# Patient Record
Sex: Female | Born: 1935
Health system: Southern US, Community
[De-identification: ages and names within clinical notes are randomized; demographics above are authoritative.]

## PROBLEM LIST (undated history)

## (undated) DIAGNOSIS — E785 Hyperlipidemia, unspecified: Secondary | ICD-10-CM

## (undated) DIAGNOSIS — F329 Major depressive disorder, single episode, unspecified: Secondary | ICD-10-CM

## (undated) DIAGNOSIS — M1990 Unspecified osteoarthritis, unspecified site: Secondary | ICD-10-CM

## (undated) DIAGNOSIS — F32A Depression, unspecified: Secondary | ICD-10-CM

## (undated) DIAGNOSIS — F0390 Unspecified dementia without behavioral disturbance: Secondary | ICD-10-CM

## (undated) DIAGNOSIS — I10 Essential (primary) hypertension: Secondary | ICD-10-CM

## (undated) HISTORY — DX: Hyperlipidemia, unspecified: E78.5

## (undated) HISTORY — PX: ABDOMINAL HYSTERECTOMY: SHX81

## (undated) HISTORY — DX: Essential (primary) hypertension: I10

## (undated) HISTORY — DX: Major depressive disorder, single episode, unspecified: F32.9

## (undated) HISTORY — DX: Depression, unspecified: F32.A

---

## 1989-03-02 HISTORY — PX: JOINT REPLACEMENT: SHX530

## 2004-05-20 ENCOUNTER — Ambulatory Visit: Payer: Self-pay | Admitting: Internal Medicine

## 2005-05-26 ENCOUNTER — Ambulatory Visit: Payer: Self-pay | Admitting: Internal Medicine

## 2006-05-28 ENCOUNTER — Ambulatory Visit: Payer: Self-pay | Admitting: Internal Medicine

## 2007-06-29 ENCOUNTER — Ambulatory Visit: Payer: Self-pay | Admitting: Internal Medicine

## 2008-07-04 ENCOUNTER — Ambulatory Visit: Payer: Self-pay | Admitting: Internal Medicine

## 2009-07-16 ENCOUNTER — Ambulatory Visit: Payer: Self-pay | Admitting: Internal Medicine

## 2012-05-03 ENCOUNTER — Ambulatory Visit: Payer: Self-pay | Admitting: Ophthalmology

## 2012-05-03 LAB — POTASSIUM: Potassium: 3.6 mmol/L (ref 3.5–5.1)

## 2012-05-16 ENCOUNTER — Ambulatory Visit: Payer: Self-pay | Admitting: Ophthalmology

## 2013-02-08 ENCOUNTER — Ambulatory Visit: Payer: Self-pay | Admitting: Family Medicine

## 2013-04-11 LAB — BASIC METABOLIC PANEL
BUN: 17 mg/dL (ref 4–21)
Creatinine: 1 mg/dL (ref <=1.1)
GLUCOSE: 92 mg/dL
Potassium: 4 mmol/L (ref 3.4–5.3)
Sodium: 138 mmol/L (ref 137–147)

## 2013-04-11 LAB — LIPID PANEL
CHOLESTEROL: 246 mg/dL — AB (ref 0–200)
HDL: 70 mg/dL (ref 35–70)
LDL Cholesterol: 162 mg/dL
LDl/HDL Ratio: 3.5
Triglycerides: 68 mg/dL (ref 40–160)

## 2013-11-02 ENCOUNTER — Ambulatory Visit (INDEPENDENT_AMBULATORY_CARE_PROVIDER_SITE_OTHER): Payer: Commercial Managed Care - HMO | Admitting: Internal Medicine

## 2013-11-02 ENCOUNTER — Encounter: Payer: Self-pay | Admitting: Internal Medicine

## 2013-11-02 ENCOUNTER — Other Ambulatory Visit: Payer: Self-pay | Admitting: *Deleted

## 2013-11-02 VITALS — BP 140/82 | HR 70 | Temp 98.2°F | Resp 20 | Ht 66.93 in | Wt 156.2 lb

## 2013-11-02 DIAGNOSIS — R059 Cough, unspecified: Secondary | ICD-10-CM

## 2013-11-02 DIAGNOSIS — F329 Major depressive disorder, single episode, unspecified: Secondary | ICD-10-CM

## 2013-11-02 DIAGNOSIS — E785 Hyperlipidemia, unspecified: Secondary | ICD-10-CM

## 2013-11-02 DIAGNOSIS — R05 Cough: Secondary | ICD-10-CM

## 2013-11-02 DIAGNOSIS — N3941 Urge incontinence: Secondary | ICD-10-CM

## 2013-11-02 DIAGNOSIS — N390 Urinary tract infection, site not specified: Secondary | ICD-10-CM | POA: Insufficient documentation

## 2013-11-02 DIAGNOSIS — F4321 Adjustment disorder with depressed mood: Secondary | ICD-10-CM

## 2013-11-02 DIAGNOSIS — R413 Other amnesia: Secondary | ICD-10-CM | POA: Insufficient documentation

## 2013-11-02 DIAGNOSIS — R053 Chronic cough: Secondary | ICD-10-CM

## 2013-11-02 DIAGNOSIS — Z23 Encounter for immunization: Secondary | ICD-10-CM

## 2013-11-02 DIAGNOSIS — I10 Essential (primary) hypertension: Secondary | ICD-10-CM

## 2013-11-02 MED ORDER — MIRABEGRON ER 25 MG PO TB24: 25.0000 mg | ORAL_TABLET | Freq: Every day | ORAL | Status: DC

## 2013-11-02 NOTE — Addendum Note (Signed)
Addended by: Jearld Adjutant on: 11/02/2013 11:27 AM   Modules accepted: Orders

## 2013-11-02 NOTE — Patient Instructions (Signed)
Stop oxybutynin Start myrbetriq 25mg  samples (two weeks provided)--if you tolerate this, fill the prescription (was faxed to pharmacy)

## 2013-11-02 NOTE — Progress Notes (Signed)
Patient ID: Katherine Olson, female   DOB: September 03, 1926, 80 y.o.   MRN: [id number]   Location:  [facility]  Code Status: Has living will and HCPOA (her son) and she does not want resuscitation  Allergies not on file  Chief Complaint  Patient presents with  . Establish Care    HPI: Patient is a 80 y.o. black female seen in the office today to establish with the practice.  She was referred by our previous medical assistant.  She has a h/o htn, hyperlipidemia, partial hysterectomy and total hip replacement  Previously went to [facility] in [place].  Her son feels she should see a female physician.  Pt says she is fine.  Sleeps well at night.  Has a good appetite.  Works in her flowers.  Does housework.  Denies aches and pains.  Did have them when she was on cholesterol medication.  Feels better since off of them.  No headaches.  No indigestion.    Has tried pravastatin and zocor with cramps at night and joint stiffness.  Is now on fish oil, niacin and coq10.  Has fasted to see what cholesterol is.  Has lost a few lbs.  Is a fan of food in general--sweets, starches, just not pork.    Her son lives with her.  He took care of his father until he passed and now is looking after her.    Kidneys were elevated a little last time per son.  Calcium had been elevated also.    Has a dry cough.  Gets strangled.  Feels her throat is dry.    Had lost several sisters around the same time and her husband so was started on celexa.  Mood is ok.  Takes daily.  Does not use the ativans but 1-2x per month when alone and anxious.    Takes ditropan.  Wears pads for protection.  Usually can make it to the bathroom.  Does not leak.  Knows when she has to go.  Sometimes does not COMPLETELY make it.    Pt says her memory is good.  Her son disagrees.  Forgets sometimes. She does not get lost.     Review of Systems:  Review of Systems  Constitutional: Negative for  fever, chills, weight loss and malaise/fatigue.  HENT: Negative for congestion, hearing loss and sore throat.   Respiratory: Positive for cough. Negative for shortness of breath.   Cardiovascular: Negative for chest pain.  Skin: Negative for rash.  Neurological: Negative for weakness and headaches.     Past Medical History  Diagnosis Date  . Hyperlipidemia     Past Surgical History  Procedure Laterality Date  . Abdominal hysterectomy    . Joint replacement Right 1991    hip    Social History:   reports that she has never smoked. She does not have any smokeless tobacco history on file. She reports that she does not drink alcohol or use illicit drugs.  No family history on file.  Medications: Patient's Medications  New Prescriptions   No medications on file  Previous Medications   ASPIRIN 81 MG TABLET    Take 81 mg by mouth daily.   CALCIUM CARBONATE (OS-CAL) 600 MG TABS TABLET    Take 600 mg by mouth 2 (two) times daily with a meal.   CITALOPRAM (CELEXA) 20 MG TABLET    Take 20 mg by mouth daily.   COENZYME Q10 (CO Q-10) 200 MG  CAPS    Take 1 tablet by mouth daily.    LORAZEPAM (ATIVAN) 0.5 MG TABLET    Take 0.5 mg by mouth every 8 (eight) hours.   LOSARTAN-HYDROCHLOROTHIAZIDE (HYZAAR) 50-12.5 MG PER TABLET    Take 1 tablet by mouth daily.   MULTIPLE VITAMINS-MINERALS (MULTIVITAMIN WITH MINERALS) TABLET    Take 1 tablet by mouth daily.   NIACIN (NIASPAN) 500 MG CR TABLET    Take 1,000 mg by mouth at bedtime.    OMEGA 3 1200 MG CAPS    Take 2,400 mg by mouth 2 (two) times daily.    OXYBUTYNIN (DITROPAN) 5 MG TABLET    Take 5 mg by mouth 2 (two) times daily.   Modified Medications   No medications on file  Discontinued Medications   No medications on file     Physical Exam: Filed Vitals:   11/02/13 0906  BP: 140/82  Pulse: 70  Temp: 98.2 F (36.8 C)  TempSrc: Oral  Resp: 20  Height: 5' 6.93" (1.7 m)  Weight: 156 lb 3.2 oz (70.852 kg)  SpO2: 96%   Physical  Exam  Constitutional: She is oriented to person, place, and time. She appears well-developed and well-nourished. No distress.  HENT:  Head: Normocephalic and atraumatic.  Eyes:  glasses  Cardiovascular: Normal rate, regular rhythm, normal heart sounds and intact distal pulses.   Pulmonary/Chest: Effort normal and breath sounds normal. No respiratory distress.  Abdominal: Soft. Bowel sounds are normal. She exhibits no distension. There is no tenderness.  Musculoskeletal: Normal range of motion.  Neurological: She is alert and oriented to person, place, and time.  Skin: Skin is warm and dry.  Psychiatric: She has a normal mood and affect.    Labs reviewed: Reviewed in records   Assessment/Plan 1. Urge incontinence -d/c oxybutynin due to potential effects on memory, dry mouth, constipation, fall risk - mirabegron ER (MYRBETRIQ) 25 MG TB24 tablet; Take 1 tablet (25 mg total) by mouth daily. For urge incontinence  Dispense: 30 tablet; Refill: 3 - CBC With differential/Platelet - Comprehensive metabolic panel - Lipid panel  2. Memory loss - will evaluate labs that can affect memory: - B12 and Folate Panel - TSH -does not use ativan enough to affect it  3. Hyperlipidemia -does not tolerate zocor or pravachol -on niacin, fish oil and co q 10 -check flp today and will notify her son what to do next  4. Essential hypertension -bp at goal with current meds and age 80 yo  5. Need for vaccination with 13-polyvalent pneumococcal conjugate vaccine -we don't have any of this today so will need to get next time  6. Reactive depression (situational) -is very well controlled with celexa -would not change this b/c she is not requiring much ativan at this point (uses when anxious if lonely)  7.  Persistent dry cough -only at night -suspect due to losartan/hctz -she does not feel bothered by this and chooses to continue it at this time--to let me know if she changes her  mind  Labs/tests ordered: Orders Placed This Encounter  Procedures  . B12 and Folate Panel  . CBC With differential/Platelet  . Comprehensive metabolic panel  . Lipid panel  . TSH  urine culture  REFUSED INFLUENZA VACCINE  Next appt: 6 wks for annual exam with mmse, f/u on bladder and for prevnar vaccine

## 2013-11-03 ENCOUNTER — Telehealth: Payer: Self-pay | Admitting: *Deleted

## 2013-11-03 LAB — COMPREHENSIVE METABOLIC PANEL
ALT: 6 IU/L (ref 0–32)
AST: 13 IU/L (ref 0–40)
Albumin/Globulin Ratio: 1.6 (ref 1.1–2.5)
Albumin: 4.2 g/dL (ref 3.5–4.7)
Alkaline Phosphatase: 59 IU/L (ref 39–117)
BUN/Creatinine Ratio: 19 (ref 11–26)
BUN: 20 mg/dL (ref 8–27)
CO2: 25 mmol/L (ref 18–29)
Calcium: 10.2 mg/dL (ref 8.7–10.3)
Chloride: 102 mmol/L (ref 97–108)
Creatinine, Ser: 1.05 mg/dL — ABNORMAL HIGH (ref 0.57–1.00)
GFR calc Af Amer: 56 mL/min/{1.73_m2} — ABNORMAL LOW (ref >59)
GFR calc non Af Amer: 49 mL/min/{1.73_m2} — ABNORMAL LOW (ref >59)
Globulin, Total: 2.6 g/dL (ref 1.5–4.5)
Glucose: 94 mg/dL (ref 65–99)
Potassium: 4 mmol/L (ref 3.5–5.2)
Sodium: 142 mmol/L (ref 134–144)
Total Bilirubin: 0.4 mg/dL (ref 0.0–1.2)
Total Protein: 6.8 g/dL (ref 6.0–8.5)

## 2013-11-03 LAB — LIPID PANEL
Chol/HDL Ratio: 2.8 ratio units (ref 0.0–4.4)
Cholesterol, Total: 227 mg/dL — ABNORMAL HIGH (ref 100–199)
HDL: 82 mg/dL (ref >39)
LDL Calculated: 134 mg/dL — ABNORMAL HIGH (ref 0–99)
Triglycerides: 55 mg/dL (ref 0–149)
VLDL Cholesterol Cal: 11 mg/dL (ref 5–40)

## 2013-11-03 LAB — CBC WITH DIFFERENTIAL
Basophils Absolute: 0.1 10*3/uL (ref 0.0–0.2)
Basos: 1 %
Eos: 1 %
Eosinophils Absolute: 0.1 10*3/uL (ref 0.0–0.4)
HCT: 37.7 % (ref 34.0–46.6)
Hemoglobin: 13 g/dL (ref 11.1–15.9)
Immature Grans (Abs): 0 10*3/uL (ref 0.0–0.1)
Immature Granulocytes: 0 %
Lymphocytes Absolute: 2 10*3/uL (ref 0.7–3.1)
Lymphs: 26 %
MCH: 30.7 pg (ref 26.6–33.0)
MCHC: 34.5 g/dL (ref 31.5–35.7)
MCV: 89 fL (ref 79–97)
Monocytes Absolute: 0.6 10*3/uL (ref 0.1–0.9)
Monocytes: 8 %
Neutrophils Absolute: 5 10*3/uL (ref 1.4–7.0)
Neutrophils Relative %: 64 %
Platelets: 227 10*3/uL (ref 150–379)
RBC: 4.23 x10E6/uL (ref 3.77–5.28)
RDW: 13.4 % (ref 12.3–15.4)
WBC: 7.8 10*3/uL (ref 3.4–10.8)

## 2013-11-03 LAB — URINE CULTURE: Organism ID, Bacteria: NO GROWTH

## 2013-11-03 LAB — B12 AND FOLATE PANEL
Folate: >19.9 ng/mL (ref >3.0)
Vitamin B-12: 478 pg/mL (ref 211–946)

## 2013-11-03 LAB — URINALYSIS
Bilirubin, UA: NEGATIVE
Glucose, UA: NEGATIVE
Ketones, UA: NEGATIVE
Nitrite, UA: NEGATIVE
Protein, UA: NEGATIVE
RBC, UA: NEGATIVE
Specific Gravity, UA: 1.018 (ref 1.005–1.030)
Urobilinogen, Ur: 1 mg/dL (ref 0.0–1.9)
pH, UA: 7 (ref 5.0–7.5)

## 2013-11-03 LAB — TSH: TSH: 2.75 u[IU]/mL (ref 0.450–4.500)

## 2013-11-03 NOTE — Telephone Encounter (Signed)
Message copied by Eilene Ghazi on Fri Nov 03, 2013  3:04 PM ------      Message from: [place], [place]      Created: Fri Nov 03, 2013  8:08 AM       Please call her son:  Cholesterol has improved from prior readings about 6 mos ago.  Her LDL is 134 with goal less than 100, but she is low risk--she does not have coronary artery disease or diabetes, no known prior strokes or heart attacks.  With very good HDL, I would encourage more walking for exercise and continue healthy low cholesterol diet, plus the niacin, fish oil and coq10 as discussed yesterday.  No changes. ------

## 2013-11-03 NOTE — Telephone Encounter (Signed)
Spoke with the patient's son regarding her lab results, informed him that her labs look good and she has improved from 6 mos ago. I informed him that she need to continue the healthy diet that she was doing and exercise by walking more.

## 2013-11-07 ENCOUNTER — Telehealth: Payer: Self-pay | Admitting: *Deleted

## 2013-11-07 NOTE — Telephone Encounter (Signed)
Disregard this message. Entered in Error. (Computer went down)

## 2013-11-07 NOTE — Telephone Encounter (Signed)
There are no safe alternatives.  Other meds for urinary incontinence cause confusion, falls, dry mouth and constipation.  I do not recommend them.  Hopefully, myrbetriq will eventually get to be cheaper.  They can try one of the others if he wants, but those are the risks-if they still want something, vesicare is the next best 5mg  po daily.

## 2013-11-07 NOTE — Telephone Encounter (Signed)
Patient son called and stated that his mom is having Hallucinations with the new "pee pill". But wasn't 100% sure that it is coming from that or something else. Please Advise.

## 2013-11-07 NOTE — Telephone Encounter (Signed)
Patient son called and stated that the Myrbetriq is too expensive and needs an alternative. Please Advise.

## 2013-11-08 NOTE — Telephone Encounter (Signed)
LMOM to return call.

## 2013-11-08 NOTE — Telephone Encounter (Signed)
Patient son stated that he will hold off of the medication for now. Will wait and see if the medication gets cheaper.

## 2013-11-20 ENCOUNTER — Ambulatory Visit: Payer: Self-pay | Admitting: Internal Medicine

## 2013-12-08 ENCOUNTER — Telehealth: Payer: Self-pay | Admitting: *Deleted

## 2013-12-08 NOTE — Telephone Encounter (Signed)
Katherine Olson, Son called and stated that patient has a appointment on 10/19. And he wants to make sure the Dr. Franco Nones that she is getting forgetful. Son does not want you to say anything to her about maybe having Dementia or Alzheimer's. Son doesn't want her upset and get more depressed. Son stated that the Dr. Lilian Coma tell him everything but not his mother. Son very [employer] attorney

## 2013-12-18 ENCOUNTER — Encounter: Payer: Self-pay | Admitting: Internal Medicine

## 2013-12-18 ENCOUNTER — Ambulatory Visit (INDEPENDENT_AMBULATORY_CARE_PROVIDER_SITE_OTHER): Payer: Commercial Managed Care - HMO | Admitting: Internal Medicine

## 2013-12-18 VITALS — BP 152/78 | HR 66 | Temp 97.4°F | Resp 20 | Ht 66.0 in | Wt 158.8 lb

## 2013-12-18 DIAGNOSIS — Z23 Encounter for immunization: Secondary | ICD-10-CM

## 2013-12-18 DIAGNOSIS — E785 Hyperlipidemia, unspecified: Secondary | ICD-10-CM

## 2013-12-18 DIAGNOSIS — I10 Essential (primary) hypertension: Secondary | ICD-10-CM

## 2013-12-18 DIAGNOSIS — G3184 Mild cognitive impairment, so stated: Secondary | ICD-10-CM

## 2013-12-18 DIAGNOSIS — N3941 Urge incontinence: Secondary | ICD-10-CM

## 2013-12-18 NOTE — Addendum Note (Signed)
Addended by: Eilene Ghazi on: 12/18/2013 02:23 PM   Modules accepted: Orders

## 2013-12-18 NOTE — Progress Notes (Signed)
Clock passed

## 2013-12-18 NOTE — Progress Notes (Signed)
Patient ID: Katherine Olson, female   DOB: 1926/09/21, 80 y.o.   MRN: [id number]   Location:  [facility]   Allergies  Allergen Reactions  . Zocor [Simvastatin] Other (See Comments)    Stiffness in joints    Chief Complaint  Patient presents with  . Medical Management of Chronic Issues    HPI: Patient is a 80 y.o. black female seen in the office today for 6 wk fu on med mgt of chronic diseases.  Got prevnar 13.  Previously had pneumococcal vaccine.  Scored 25/30 on MMSE today.  Passed clock drawing.  No new concerns from her son today.    Has had some increased urge incontinence--tried the myrbetriq, but pt did not want to spend that much on it.   Is taking fish oil and niacin.  Good cholesterol is very good.  Did not tolerate statin due to cramps.  Her son is encouraging water and healthy diet.  Discussed walking as well.  Discussed indoor walking at large stores.    Review of Systems:  Review of Systems  Constitutional: Negative for fever.  HENT: Negative for congestion.   Eyes: Negative for blurred vision.  Respiratory: Negative for shortness of breath.   Cardiovascular: Negative for chest pain.  Gastrointestinal: Negative for abdominal pain.  Genitourinary: Positive for urgency and frequency. Negative for dysuria.       Esp at night  Musculoskeletal: Negative for falls and myalgias.  Skin: Negative for rash.  Neurological: Negative for dizziness.  Endo/Heme/Allergies: Does not bruise/bleed easily.  Psychiatric/Behavioral: Positive for depression and memory loss.    Past Medical History  Diagnosis Date  . Hyperlipidemia   . Hypertension     Past Surgical History  Procedure Laterality Date  . Abdominal hysterectomy      partial  . Joint replacement Right 1991    hip    Social History:   reports that she has never smoked. She does not have any smokeless tobacco history on file. She reports that she does not drink alcohol or  use illicit drugs.  Family History  Problem Relation Age of Onset  . Alzheimer's disease Brother   . Heart disease Sister   . Cancer Sister     kidney  . Cancer Mother     liver  . Cancer Father     prostate    Medications: Patient's Medications  New Prescriptions   No medications on file  Previous Medications   ASPIRIN 81 MG TABLET    Take 81 mg by mouth daily.   CITALOPRAM (CELEXA) 20 MG TABLET    Take 20 mg by mouth daily.   COENZYME Q10 (CO Q-10) 200 MG CAPS    Take 1 tablet by mouth daily.    LORAZEPAM (ATIVAN) 0.5 MG TABLET    Take 0.5 mg by mouth every 8 (eight) hours.   LOSARTAN-HYDROCHLOROTHIAZIDE (HYZAAR) 50-12.5 MG PER TABLET    Take 1 tablet by mouth daily.   MIRABEGRON ER (MYRBETRIQ) 25 MG TB24 TABLET    Take 1 tablet (25 mg total) by mouth daily. For urge incontinence   MULTIPLE VITAMINS-MINERALS (MULTIVITAMIN WITH MINERALS) TABLET    Take 1 tablet by mouth daily.   NIACIN (NIASPAN) 500 MG CR TABLET    Take 1,000 mg by mouth at bedtime.    OMEGA 3 1200 MG CAPS    Take 1,200 mg by mouth 2 (two) times daily.   Modified Medications   No medications  on file  Discontinued Medications   No medications on file     Physical Exam: Filed Vitals:   12/18/13 1001  BP: 152/78  Pulse: 66  Temp: 97.4 F (36.3 C)  TempSrc: Oral  Resp: 20  Height: 5\' 6"  (1.676 m)  Weight: 158 lb 12.8 oz (72.031 kg)  SpO2: 98%  Physical Exam  Constitutional: She is oriented to person, place, and time. She appears well-developed and well-nourished. No distress.  HENT:  Head: Normocephalic and atraumatic.  Cardiovascular: Normal rate, regular rhythm, normal heart sounds and intact distal pulses.   Pulmonary/Chest: Effort normal and breath sounds normal. No respiratory distress.  Abdominal: Soft. Bowel sounds are normal. She exhibits no distension and no mass. There is no tenderness.  Musculoskeletal: Normal range of motion. She exhibits no edema and no tenderness.  Neurological: She  is alert and oriented to person, place, and time.  Skin: Skin is warm and dry.  Psychiatric: She has a normal mood and affect.    Labs reviewed: Basic Metabolic Panel:  Recent Labs  04/11/13 11/02/13 1013  NA 138 142  K 4.0 4.0  CL  --  102  CO2  --  25  GLUCOSE  --  94  BUN 17 20  CREATININE 1.0 1.05*  CALCIUM  --  10.2  TSH  --  2.750   Liver Function Tests:  Recent Labs  11/02/13 1013  AST 13  ALT 6  ALKPHOS 59  BILITOT 0.4  PROT 6.8   No results found for this basename: LIPASE, AMYLASE,  in the last 8760 hours No results found for this basename: AMMONIA,  in the last 8760 hours CBC:  Recent Labs  11/02/13 1013  WBC 7.8  NEUTROABS 5.0  HGB 13.0  HCT 37.7  MCV 89  PLT 227   Lipid Panel:  Recent Labs  04/11/13 11/02/13 1013  CHOL 246*  --   HDL 70 82  LDLCALC 162 134*  TRIG 68 55  CHOLHDL  --  2.8   Assessment/Plan 1. Urge incontinence -pt does not want to take myrbetriq just yet due to cost -advised to please fill if her symptoms are getting worse   2. Hyperlipidemia -good HDL, slight elevation LDL, but no other risk factors -cont niacin and fish oil and recheck flp next time--if trending up, would find alternative to statin to start her on at that time  3. Essential hypertension -bp just above goal for age today, will monitor and consider adjusting meds if remains high next time--is not thrilled with coming to doctor  4. Mild cognitive impairment with memory loss -25/30, but passed clock, son helping with appts, meds, and will put on medications if functional decline truly detected  prevnar and flu shots given today  Labs/tests ordered:  Lipids, bmp before appt Next appt:  Feb for f/u lipids, memory, and incontinence  Ariyel Jeangilles L. Tyann Niehaus, D.O. [facility] [address], [place] [address] Cell Phone (Mon-Fri 8am-5pm):  [phone] On Call:  [phone] & follow prompts after 5pm &  weekends Office Phone:  [phone] Office Fax:  [phone]

## 2014-01-29 ENCOUNTER — Telehealth: Payer: Self-pay

## 2014-01-29 MED ORDER — LOSARTAN POTASSIUM-HCTZ 50-12.5 MG PO TABS: 1.0000 | ORAL_TABLET | Freq: Every day | ORAL | Status: DC

## 2014-01-29 NOTE — Telephone Encounter (Signed)
Son called , trying to get refill on mothers Losartan, called [facility] several days ago. Told him I would fax it over now. Wants 90 day supply

## 2014-01-30 ENCOUNTER — Telehealth: Payer: Self-pay | Admitting: *Deleted

## 2014-01-30 DIAGNOSIS — Z1211 Encounter for screening for malignant neoplasm of colon: Secondary | ICD-10-CM

## 2014-01-30 NOTE — Telephone Encounter (Signed)
Ok to refer for colonoscopy at [facility].  I doubt she will get it before Christmas, but we can try.

## 2014-01-30 NOTE — Telephone Encounter (Signed)
Patient son wants referral to [facility] [phone] for a routine Colonoscopy (done there previously)  it is overdue- last one was in 04/2002. Son wants it done before Christmas. Please Advise.

## 2014-01-30 NOTE — Telephone Encounter (Signed)
Order Placed and son notified that Earlie Server will be setting this referral up.

## 2014-02-14 ENCOUNTER — Other Ambulatory Visit: Payer: Self-pay | Admitting: Internal Medicine

## 2014-02-27 ENCOUNTER — Ambulatory Visit: Payer: Self-pay | Admitting: Gastroenterology

## 2014-03-16 ENCOUNTER — Encounter: Payer: Self-pay | Admitting: Internal Medicine

## 2014-04-06 ENCOUNTER — Ambulatory Visit (INDEPENDENT_AMBULATORY_CARE_PROVIDER_SITE_OTHER): Payer: Commercial Managed Care - HMO | Admitting: Internal Medicine

## 2014-04-06 ENCOUNTER — Encounter: Payer: Self-pay | Admitting: Internal Medicine

## 2014-04-06 ENCOUNTER — Telehealth: Payer: Self-pay | Admitting: *Deleted

## 2014-04-06 VITALS — BP 128/84 | HR 60 | Temp 97.8°F | Resp 10 | Ht 66.0 in | Wt 160.0 lb

## 2014-04-06 DIAGNOSIS — I10 Essential (primary) hypertension: Secondary | ICD-10-CM

## 2014-04-06 DIAGNOSIS — E785 Hyperlipidemia, unspecified: Secondary | ICD-10-CM

## 2014-04-06 DIAGNOSIS — Z1239 Encounter for other screening for malignant neoplasm of breast: Secondary | ICD-10-CM

## 2014-04-06 DIAGNOSIS — Z23 Encounter for immunization: Secondary | ICD-10-CM

## 2014-04-06 DIAGNOSIS — Z0000 Encounter for general adult medical examination without abnormal findings: Secondary | ICD-10-CM

## 2014-04-06 DIAGNOSIS — H269 Unspecified cataract: Secondary | ICD-10-CM

## 2014-04-06 DIAGNOSIS — G309 Alzheimer's disease, unspecified: Secondary | ICD-10-CM

## 2014-04-06 DIAGNOSIS — F329 Major depressive disorder, single episode, unspecified: Secondary | ICD-10-CM

## 2014-04-06 DIAGNOSIS — N3941 Urge incontinence: Secondary | ICD-10-CM

## 2014-04-06 DIAGNOSIS — F0280 Dementia in other diseases classified elsewhere without behavioral disturbance: Secondary | ICD-10-CM

## 2014-04-06 DIAGNOSIS — F4321 Adjustment disorder with depressed mood: Secondary | ICD-10-CM | POA: Diagnosis not present

## 2014-04-06 MED ORDER — TETANUS-DIPHTH-ACELL PERTUSSIS 5-2.5-18.5 LF-MCG/0.5 IM SUSP: 0.5000 mL | Freq: Once | INTRAMUSCULAR | Status: DC

## 2014-04-06 MED ORDER — MEMANTINE HCL ER 28 MG PO CP24: 28.0000 mg | ORAL_CAPSULE | Freq: Every day | ORAL | Status: DC

## 2014-04-06 MED ORDER — ZOSTER VACCINE LIVE 19400 UNT/0.65ML ~~LOC~~ SOLR: 0.6500 mL | Freq: Once | SUBCUTANEOUS | Status: DC

## 2014-04-06 MED ORDER — MEMANTINE HCL ER 7 & 14 & 21 &28 MG PO CP24
ORAL_CAPSULE | ORAL | Status: DC
Start: 2014-04-06 — End: 2014-05-02

## 2014-04-06 NOTE — Telephone Encounter (Signed)
Katherine Olson, son called and had concerns regarding appointment today. Wanted to know why she needed a mammogram, told him that it is a preventive screening. He wanted to make sure the Dr. Azucena Fallen feel a lump. Also  Wanted to know her last MMSE score. Gave that to him and he wanted to know why she was put on Memory medication. I explained to him that it was preventive to preserve her memory.Told patient son we were here for him if any other concerns. He agreed.

## 2014-04-06 NOTE — Progress Notes (Signed)
Patient ID: Katherine Olson, female   DOB: September 01, 1926, 81 y.o.   MRN: [id number]   Location:  [facility] / [facility]  Code Status: Has living will and HCPOA (her son) and she does not want resuscitation  Allergies  Allergen Reactions  . Zocor [Simvastatin] Other (See Comments)    Stiffness in joints    Chief Complaint  Patient presents with  . Annual Exam    Yearly check-up, no recent labs. MMSE 21/30 (passed clock drawing)- patient states "I'm nervous but I know the answers"  . Referral    Eye doctor referral per insurance, Dr.Dingeldine (pending appointment 04/13/14)     HPI: Patient is a 81 y.o. black female seen in the office today for her annual exam.    She scored MMSE 21/30 and passed clock.    Needs eye doctor referral.    Says she has no concerns.  She has not been exercising in the yard.  No aches and pains.    Cannot hold her urine like she used to.  Decided not to get myrbetriq due to cost.  The previous ones had side effects.  Uses depends.    Mood is good, not depressed.  Review of Systems:  Review of Systems  Constitutional: Negative for fever and malaise/fatigue.  HENT: Negative for congestion.   Eyes: Negative for blurred vision.  Respiratory: Negative for shortness of breath.   Cardiovascular: Negative for chest pain and leg swelling.  Gastrointestinal: Positive for constipation. Negative for abdominal pain, blood in stool and melena.       Takes prune juice sometimes  Genitourinary: Positive for urgency and frequency. Negative for dysuria.       Incontinence--leakage--uses depends  Musculoskeletal: Negative for falls.  Skin: Negative for rash.  Neurological: Negative for dizziness and weakness.  Endo/Heme/Allergies: Does not bruise/bleed easily.  Psychiatric/Behavioral: Positive for depression and memory loss. The patient is nervous/anxious.     Past Medical History  Diagnosis Date  . Hyperlipidemia   . Hypertension       Past Surgical History  Procedure Laterality Date  . Abdominal hysterectomy      partial  . Joint replacement Right 1991    hip    Social History:   reports that she has never smoked. She does not have any smokeless tobacco history on file. She reports that she does not drink alcohol or use illicit drugs.  Family History  Problem Relation Age of Onset  . Alzheimer's disease Brother   . Heart disease Sister   . Cancer Sister     kidney  . Cancer Mother     liver  . Cancer Father     prostate    Medications: Patient's Medications  New Prescriptions   No medications on file  Previous Medications   ASPIRIN 81 MG TABLET    Take 81 mg by mouth daily.   CITALOPRAM (CELEXA) 20 MG TABLET    TAKE 1 TABLET BY MOUTH DAILY AS NEEDED FOR DEPRESSION   COENZYME Q10 (CO Q-10) 200 MG CAPS    Take 1 tablet by mouth daily.    LORAZEPAM (ATIVAN) 0.5 MG TABLET    Take 0.5 mg by mouth every 8 (eight) hours.   LOSARTAN-HYDROCHLOROTHIAZIDE (HYZAAR) 50-12.5 MG PER TABLET    Take 1 tablet by mouth daily.   MIRABEGRON ER (MYRBETRIQ) 25 MG TB24 TABLET    Take 1 tablet (25 mg total) by mouth daily. For urge incontinence   MULTIPLE VITAMINS-MINERALS (  MULTIVITAMIN WITH MINERALS) TABLET    Take 1 tablet by mouth daily.   NIACIN (NIASPAN) 500 MG CR TABLET    Take 1,000 mg by mouth at bedtime.    OMEGA 3 1200 MG CAPS    Take 1,200 mg by mouth 2 (two) times daily.   Modified Medications   Modified Medication Previous Medication   TDAP (BOOSTRIX) 5-2.5-18.5 LF-MCG/0.5 INJECTION Tdap (BOOSTRIX) 5-2.5-18.5 LF-MCG/0.5 injection      Inject 0.5 mLs into the muscle once.    Inject 0.5 mLs into the muscle once.   ZOSTER VACCINE LIVE, PF, (ZOSTAVAX) 40814 UNT/0.65ML INJECTION zoster vaccine live, PF, (ZOSTAVAX) 48185 UNT/0.65ML injection      Inject 19,400 Units into the skin once.    Inject 0.65 mLs into the skin once.  Discontinued Medications   No medications on file     Physical Exam: Filed Vitals:    04/06/14 0853  BP: 128/84  Pulse: 60  Temp: 97.8 F (36.6 C)  TempSrc: Oral  Resp: 10  Height: 5\' 6"  (1.676 m)  Weight: 160 lb (72.576 kg)  SpO2: 97%  Physical Exam  Constitutional: She is oriented to person, place, and time. She appears well-developed and well-nourished.  HENT:  Head: Normocephalic and atraumatic.  Right Ear: External ear normal.  Left Ear: External ear normal.  Nose: Nose normal.  Mouth/Throat: Oropharynx is clear and moist.  Eyes: Conjunctivae and EOM are normal. Pupils are equal, round, and reactive to light.  Cat eye glasses  Neck: Normal range of motion. Neck supple. No JVD present. No tracheal deviation present. No thyromegaly present.  Cardiovascular: Normal rate, regular rhythm, normal heart sounds and intact distal pulses.   Pulmonary/Chest: Effort normal and breath sounds normal. No respiratory distress. Right breast exhibits no inverted nipple, no mass, no nipple discharge, no skin change and no tenderness. Left breast exhibits no inverted nipple, no mass, no nipple discharge, no skin change and no tenderness.  Abdominal: Soft. Bowel sounds are normal. She exhibits no distension and no mass. There is no tenderness.  Musculoskeletal: Normal range of motion.  Neurological: She is alert and oriented to person, place, and time. She has normal reflexes. No cranial nerve deficit.  Skin: Skin is warm and dry.  Psychiatric: She has a normal mood and affect.  Is anxious    Labs reviewed: Basic Metabolic Panel:  Recent Labs  04/11/13 11/02/13 1013  NA 138 142  K 4.0 4.0  CL  --  102  CO2  --  25  GLUCOSE  --  94  BUN 17 20  CREATININE 1.0 1.05*  CALCIUM  --  10.2  TSH  --  2.750   Liver Function Tests:  Recent Labs  11/02/13 1013  AST 13  ALT 6  ALKPHOS 59  BILITOT 0.4  PROT 6.8   No results for input(s): LIPASE, AMYLASE in the last 8760 hours. No results for input(s): AMMONIA in the last 8760 hours. CBC:  Recent Labs  11/02/13 1013    WBC 7.8  NEUTROABS 5.0  HGB 13.0  HCT 37.7  MCV 89  PLT 227   Lipid Panel:  Recent Labs  04/11/13 11/02/13 1013  CHOL 246*  --   HDL 70 82  LDLCALC 162 134*  TRIG 68 55  CHOLHDL  --  2.8    Assessment/Plan 1. Routine general medical examination at a health care facility -mammo ordered -has had zostavax -had prevnar -had flu shot -had pneumovax -not currently depressed, but on  celexa (dealt with death of husband and one son) -had cscope last year 37 -MMSE today 21/30 passing clock -eKG was done and no acute ischemia/infarct  2. Cataract - needs f/u for possible second cataract surgery - Ambulatory referral to Ophthalmology  3. Alzheimer's disease -early stages--scored 21/30 which is a decline from her first visit, I believe--she is anxious doing it also -start titration pack and then 28mg  90 day free coupon given - Memantine HCl ER (NAMENDA XR TITRATION PACK) 7 & 14 & 21 &28 MG CP24; Once daily per titration pack  Dispense: 28 capsule; Refill: 0 - memantine (NAMENDA XR) 28 MG CP24 24 hr capsule; Take 1 capsule (28 mg total) by mouth daily.  Dispense: 90 capsule; Refill: 0  4. Hyperlipidemia -cont fish oil and f/u labs today - Lipid panel  5. Essential hypertension -NOT at goal of <150/90 today due to white coat syndrome, but her son will check her bp weekly at home and write them down--call if >150/90 consistently - Basic metabolic panel  6. Urge incontinence -her son wants her to try myrbetriq which was previously called into the pharmacy, but she does not feel she needs it (they are going to work out if they will fill it)  7. Reactive depression (situational) -cont celexa--may need to try a different agent if anxiety does not improve  8. Breast cancer screening -order entered to get mammo at [facility]; Future  9. Need for Tdap vaccination -Rx given  HER SON SAID SHE HAD HER ZOSTAVAX ALREADY.  Labs/tests  ordered:  Bmp, flp done today Next appt:  3 mos  Aki Burdin L. Eulan Heyward, D.O. [facility] [address], [place] [address] Cell Phone (Mon-Fri 8am-5pm):  [phone] On Call:  [phone] & follow prompts after 5pm & weekends Office Phone:  [phone] Office Fax:  [phone]

## 2014-04-06 NOTE — Patient Instructions (Addendum)
Start with the namenda XR titration pack, then fill the 28mg  namenda XR 90 day prescription with the coupon.    If your bladder problem worsens, let us know so we can treat you with myrbetriq  Please get your shingles vaccine at the pharmacy as well as your tetanus booster.

## 2014-04-06 NOTE — Progress Notes (Signed)
Passed clock drawing 

## 2014-04-07 LAB — LIPID PANEL
Chol/HDL Ratio: 2.8 ratio units (ref 0.0–4.4)
Cholesterol, Total: 255 mg/dL — ABNORMAL HIGH (ref 100–199)
HDL: 92 mg/dL (ref >39)
LDL Calculated: 154 mg/dL — ABNORMAL HIGH (ref 0–99)
Triglycerides: 45 mg/dL (ref 0–149)
VLDL Cholesterol Cal: 9 mg/dL (ref 5–40)

## 2014-04-07 LAB — BASIC METABOLIC PANEL
BUN/Creatinine Ratio: 17 (ref 11–26)
BUN: 19 mg/dL (ref 8–27)
CO2: 24 mmol/L (ref 18–29)
Calcium: 10.1 mg/dL (ref 8.7–10.3)
Chloride: 102 mmol/L (ref 97–108)
Creatinine, Ser: 1.11 mg/dL — ABNORMAL HIGH (ref 0.57–1.00)
GFR calc Af Amer: 52 mL/min/{1.73_m2} — ABNORMAL LOW (ref >59)
GFR calc non Af Amer: 45 mL/min/{1.73_m2} — ABNORMAL LOW (ref >59)
Glucose: 96 mg/dL (ref 65–99)
Potassium: 4.4 mmol/L (ref 3.5–5.2)
Sodium: 141 mmol/L (ref 134–144)

## 2014-04-13 ENCOUNTER — Other Ambulatory Visit: Payer: Self-pay

## 2014-04-13 DIAGNOSIS — Z961 Presence of intraocular lens: Secondary | ICD-10-CM | POA: Diagnosis not present

## 2014-04-13 MED ORDER — AMBULATORY NON FORMULARY MEDICATION: Status: DC

## 2014-04-13 MED ORDER — EZETIMIBE 10 MG PO TABS: 10.0000 mg | ORAL_TABLET | Freq: Every day | ORAL | Status: DC

## 2014-04-30 ENCOUNTER — Ambulatory Visit (INDEPENDENT_AMBULATORY_CARE_PROVIDER_SITE_OTHER): Payer: Commercial Managed Care - HMO | Admitting: Nurse Practitioner

## 2014-04-30 ENCOUNTER — Encounter: Payer: Self-pay | Admitting: Nurse Practitioner

## 2014-04-30 VITALS — BP 128/70 | HR 70 | Temp 98.5°F | Resp 10 | Ht 66.0 in | Wt 163.0 lb

## 2014-04-30 DIAGNOSIS — F0280 Dementia in other diseases classified elsewhere without behavioral disturbance: Secondary | ICD-10-CM

## 2014-04-30 DIAGNOSIS — I10 Essential (primary) hypertension: Secondary | ICD-10-CM

## 2014-04-30 DIAGNOSIS — K5900 Constipation, unspecified: Secondary | ICD-10-CM

## 2014-04-30 DIAGNOSIS — R42 Dizziness and giddiness: Secondary | ICD-10-CM | POA: Diagnosis not present

## 2014-04-30 DIAGNOSIS — G309 Alzheimer's disease, unspecified: Secondary | ICD-10-CM | POA: Diagnosis not present

## 2014-04-30 LAB — POCT URINALYSIS DIPSTICK
Bilirubin, UA: NEGATIVE
Glucose, UA: NEGATIVE
KETONES UA: NEGATIVE
Nitrite, UA: NEGATIVE
PH UA: 6.5
PROTEIN UA: NEGATIVE
SPEC GRAV UA: 1.01
Urobilinogen, UA: 0.2

## 2014-04-30 NOTE — Addendum Note (Signed)
Addended by: Clarene Critchley on: 04/30/2014 04:40 PM   Modules accepted: Orders

## 2014-04-30 NOTE — Patient Instructions (Signed)
Increase hydration Use prune juice to achieve good BM  Will get blood work and urine today Will call with results and further instructions if needed

## 2014-04-30 NOTE — Progress Notes (Signed)
Patient ID: Katherine Olson, female   DOB: Jul 30, 1926, 81 y.o.   MRN: [id number]    PCP: Hollace Kinnier, DO  Allergies  Allergen Reactions  . Zocor [Simvastatin] Other (See Comments)    Stiffness in joints    Chief Complaint  Patient presents with  . Acute Visit    Dizzy and slight blur in vision x couple of days  . Immunizations    Discuss getting Tetanus vaccine in office, aware may not be covered  . Medical Management of Chronic Issues    Discuss getting rx for Namenda and Zetia for 90 day supply      HPI: Patient is a 81 y.o. female seen in the office today due to dizziness. Son reports she wanted him to take her blood pressure last week so thinks this is when it started. Gradual onset and stays all the time. Holding on rails in the hall. No falls. No blurred vision. Feeling off balance.  Has been titration namenda-- son feels like it started when she was started on 21 mg and increased to 28 mg ~4 days ago. Better this morning but worse as the day has progressed. Son questioned if she is properly hydrated- has to chase her around with water because she does not want to drink.  Blood pressure 145/80, 135/80, 133/75 are some home readings.   Review of Systems:  Review of Systems  Constitutional: Negative for fever, activity change and fatigue.  HENT: Negative for congestion and sinus pressure.   Respiratory: Negative for shortness of breath.   Cardiovascular: Negative for chest pain and leg swelling.  Gastrointestinal: Positive for constipation. Negative for abdominal pain and blood in stool.       Always been constipation. Takes prune juice sometimes which helps but has not taken lately. Small BM daily.   Genitourinary: Positive for frequency. Negative for dysuria.       Incontinence-uses depends  Skin: Negative for rash.  Neurological: Positive for dizziness and light-headedness (woozy). Negative for tremors, speech difficulty, weakness and headaches.  Hematological: Does not  bruise/bleed easily.  Psychiatric/Behavioral: The patient is nervous/anxious.     Past Medical History  Diagnosis Date  . Hyperlipidemia   . Hypertension    Past Surgical History  Procedure Laterality Date  . Abdominal hysterectomy      partial  . Joint replacement Right 1991    hip   Social History:   reports that she has never smoked. She does not have any smokeless tobacco history on file. She reports that she does not drink alcohol or use illicit drugs.  Family History  Problem Relation Age of Onset  . Alzheimer's disease Brother   . Heart disease Sister   . Cancer Sister     kidney  . Cancer Mother     liver  . Cancer Father     prostate    Medications: Patient's Medications  New Prescriptions   No medications on file  Previous Medications   AMBULATORY NON FORMULARY MEDICATION    Lab Order: Lipid Panel DX E78.5   ASPIRIN 81 MG TABLET    Take 81 mg by mouth daily.   CITALOPRAM (CELEXA) 20 MG TABLET    TAKE 1 TABLET BY MOUTH DAILY AS NEEDED FOR DEPRESSION   COENZYME Q10 (CO Q-10) 200 MG CAPS    Take 1 tablet by mouth daily.    EZETIMIBE (ZETIA) 10 MG TABLET    Take 1 tablet (10 mg total) by mouth daily.   LORAZEPAM (  ATIVAN) 0.5 MG TABLET    Take 0.5 mg by mouth every 8 (eight) hours.   LOSARTAN-HYDROCHLOROTHIAZIDE (HYZAAR) 50-12.5 MG PER TABLET    Take 1 tablet by mouth daily.   MEMANTINE (NAMENDA XR) 28 MG CP24 24 HR CAPSULE    Take 1 capsule (28 mg total) by mouth daily.   MEMANTINE HCL ER (NAMENDA XR TITRATION PACK) 7 & 14 & 21 &28 MG CP24    Once daily per titration pack   MIRABEGRON ER (MYRBETRIQ) 25 MG TB24 TABLET    Take 1 tablet (25 mg total) by mouth daily. For urge incontinence   MULTIPLE VITAMINS-MINERALS (MULTIVITAMIN WITH MINERALS) TABLET    Take 1 tablet by mouth daily.   NIACIN (NIASPAN) 500 MG CR TABLET    Take 1,000 mg by mouth at bedtime.    OMEGA 3 1200 MG CAPS    Take 1,200 mg by mouth 2 (two) times daily.    TDAP (BOOSTRIX) 5-2.5-18.5  LF-MCG/0.5 INJECTION    Inject 0.5 mLs into the muscle once.  Modified Medications   No medications on file  Discontinued Medications   ZOSTER VACCINE LIVE, PF, (ZOSTAVAX) 10626 UNT/0.65ML INJECTION    Inject 19,400 Units into the skin once.     Physical Exam:  Filed Vitals:   04/30/14 1458  BP: 128/70  Pulse: 70  Temp: 98.5 F (36.9 C)  TempSrc: Oral  Resp: 10  Height: 5\' 6"  (1.676 m)  Weight: 163 lb (73.936 kg)  SpO2: 96%    Physical Exam  Constitutional: She appears well-developed and well-nourished. No distress.  HENT:  Head: Normocephalic and atraumatic.  Right Ear: External ear normal.  Left Ear: External ear normal.  Nose: Nose normal.  Mouth/Throat: No oropharyngeal exudate.  Neck: Normal range of motion. Neck supple.  Cardiovascular: Normal rate, regular rhythm and normal heart sounds.   Pulmonary/Chest: Effort normal and breath sounds normal. No respiratory distress.  Abdominal: Soft. Bowel sounds are normal. She exhibits no distension and no mass. There is no tenderness.  Musculoskeletal: Normal range of motion. She exhibits no edema or tenderness.  Neurological: She is alert. She displays normal reflexes. No cranial nerve deficit. Coordination normal.  Skin: Skin is warm and dry.  Psychiatric: She has a normal mood and affect.    Labs reviewed: Basic Metabolic Panel:  Recent Labs  11/02/13 1013 04/06/14 1008  NA 142 141  K 4.0 4.4  CL 102 102  CO2 25 24  GLUCOSE 94 96  BUN 20 19  CREATININE 1.05* 1.11*  CALCIUM 10.2 10.1  TSH 2.750  --    Liver Function Tests:  Recent Labs  11/02/13 1013  AST 13  ALT 6  ALKPHOS 59  BILITOT 0.4  PROT 6.8   No results for input(s): LIPASE, AMYLASE in the last 8760 hours. No results for input(s): AMMONIA in the last 8760 hours. CBC:  Recent Labs  11/02/13 1013  WBC 7.8  NEUTROABS 5.0  HGB 13.0  HCT 37.7  MCV 89  PLT 227   Lipid Panel:  Recent Labs  11/02/13 1013 04/06/14 1008  CHOL 227*  255*  HDL 82 92  LDLCALC 134* 154*  TRIG 55 45  CHOLHDL 2.8 2.8   TSH:  Recent Labs  11/02/13 1013  TSH 2.750   A1C: No results found for: HGBA1C   Assessment/Plan 1. Dizzy -pt reports dizzy/wozy headed.  -drinking minimally due to not wanting to urinate. Encouraged hydration with water. Increase water intake to 4 8 oz  a day- only drinking 2 8 oz bottles -also with constipation which could effect pt, to start back to taking prune juice  -will get lab work - Basic metabolic panel - CBC with Differential/Platelet - POCT urinalysis dipstick was abnormal therefore will send for  Culture, Urine; Future -may also be related to namenda titration. If no finding with labs, will have pt start back to namenda 14 mg daily and give a slower titration.  2. Essential hypertension -blood pressure WNL, will cont current medications  3. Alzheimer's disease -as above started on namenda titration which could be why pt feels off. If labs negative will decrease namenda to 14 mg and start slower titration -to cont namenda 28 mg daily for now.   4. Constipation Increase hydration and prune juice

## 2014-05-01 LAB — BASIC METABOLIC PANEL
BUN / CREAT RATIO: 18 (ref 11–26)
BUN: 21 mg/dL (ref 8–27)
CHLORIDE: 100 mmol/L (ref 97–108)
CO2: 24 mmol/L (ref 18–29)
Calcium: 10.1 mg/dL (ref 8.7–10.3)
Creatinine, Ser: 1.16 mg/dL — ABNORMAL HIGH (ref 0.57–1.00)
GFR calc Af Amer: 50 mL/min/{1.73_m2} — ABNORMAL LOW (ref >59)
GFR, EST NON AFRICAN AMERICAN: 43 mL/min/{1.73_m2} — AB (ref >59)
Glucose: 86 mg/dL (ref 65–99)
POTASSIUM: 4 mmol/L (ref 3.5–5.2)
Sodium: 140 mmol/L (ref 134–144)

## 2014-05-01 LAB — URINE CULTURE: ORGANISM ID, BACTERIA: NO GROWTH

## 2014-05-01 LAB — URINALYSIS
BILIRUBIN UA: NEGATIVE
Glucose, UA: NEGATIVE
Ketones, UA: NEGATIVE
Leukocytes, UA: NEGATIVE
NITRITE UA: NEGATIVE
PROTEIN UA: NEGATIVE
RBC, UA: NEGATIVE
SPEC GRAV UA: 1.017 (ref 1.005–1.030)
UUROB: 0.2 mg/dL (ref 0.2–1.0)
pH, UA: 6.5 (ref 5.0–7.5)

## 2014-05-01 LAB — CBC WITH DIFFERENTIAL/PLATELET
BASOS ABS: 0.1 10*3/uL (ref 0.0–0.2)
Basos: 1 %
Eos: 2 %
Eosinophils Absolute: 0.1 10*3/uL (ref 0.0–0.4)
HCT: 39.1 % (ref 34.0–46.6)
Hemoglobin: 13.4 g/dL (ref 11.1–15.9)
IMMATURE GRANS (ABS): 0 10*3/uL (ref 0.0–0.1)
IMMATURE GRANULOCYTES: 0 %
Lymphocytes Absolute: 2.8 10*3/uL (ref 0.7–3.1)
Lymphs: 37 %
MCH: 30.3 pg (ref 26.6–33.0)
MCHC: 34.3 g/dL (ref 31.5–35.7)
MCV: 89 fL (ref 79–97)
Monocytes Absolute: 0.6 10*3/uL (ref 0.1–0.9)
Monocytes: 8 %
Neutrophils Absolute: 3.8 10*3/uL (ref 1.4–7.0)
Neutrophils Relative %: 52 %
Platelets: 214 10*3/uL (ref 150–379)
RBC: 4.42 x10E6/uL (ref 3.77–5.28)
RDW: 13.5 % (ref 12.3–15.4)
WBC: 7.4 10*3/uL (ref 3.4–10.8)

## 2014-05-02 ENCOUNTER — Telehealth: Payer: Self-pay

## 2014-05-02 MED ORDER — MEMANTINE HCL ER 14 MG PO CP24
14.0000 mg | ORAL_CAPSULE | Freq: Every day | ORAL | Status: DC
Start: 2014-05-02 — End: 2014-05-07

## 2014-05-02 NOTE — Telephone Encounter (Signed)
Responded to lab work, see this note please To start namenda 14 mg daily for 2 weeks and then to make follow up for further instruction

## 2014-05-02 NOTE — Telephone Encounter (Signed)
Discussed labs with patient's son, copy to be mailed. Katherine Olson verbalized understanding of Jessica's response. RX for 14 mg sent to pharmacy. Katherine Olson will call when he feels a follow-up is needed, did not schedule at this time.   Changed PCP to [facility] per patients son request (discussed with management also)

## 2014-05-02 NOTE — Telephone Encounter (Signed)
Message left on triage voicemail 1.) Patients son Katherine Olson) would like lab results.  2.)Patient is still with dizziness, Katherine Olson held his mothers Namenda this morning until further response from this message  Please advise

## 2014-05-07 MED ORDER — MEMANTINE HCL ER 7 MG PO CP24: ORAL_CAPSULE | ORAL | Status: DC

## 2014-05-07 MED ORDER — EZETIMIBE 10 MG PO TABS: 10.0000 mg | ORAL_TABLET | Freq: Every day | ORAL | Status: DC

## 2014-05-07 NOTE — Telephone Encounter (Signed)
Son, Katherine Olson called and stated that his mother is still feeling Dizzy with the [place]. Stated that patient wakes up fine but after taking the Namenda she feels cloudy headed. Can the medication be reduced anymore? Please Advise. (sent to Dr. Eulas Post because son requested so. Patient has been switched by Caren Griffins.)

## 2014-05-07 NOTE — Addendum Note (Signed)
Addended by: Rafael Bihari A on: 05/07/2014 04:56 PM   Modules accepted: Orders, Medications

## 2014-05-07 NOTE — Telephone Encounter (Signed)
Patient son notified and agreed. Wants a 90 day supply and will make an appointment before the refill runs out. Stated that they were just seen in the office.

## 2014-05-07 NOTE — Telephone Encounter (Signed)
Ok to reduce namenda XR 7 mg daily. She needs a f/u appt to establish with me.

## 2014-05-10 ENCOUNTER — Telehealth: Payer: Self-pay | Admitting: *Deleted

## 2014-05-10 NOTE — Telephone Encounter (Signed)
Noted, please have her see Dr Eulas Post to resolve any additional issues

## 2014-05-10 NOTE — Telephone Encounter (Signed)
Noted, thank you

## 2014-05-10 NOTE — Telephone Encounter (Signed)
Thank you. Noted.

## 2014-05-10 NOTE — Telephone Encounter (Signed)
Son declined an appointment with PCP.Marland Kitchen  Says he would like Katherine Olson to see [facility] for [facility] ear.  Told patient I would discuss with Provider and call him back. Son says he will continue to give Katherine Olson the  [place] 7mg  daily until her next appt. He will call us back to schedule her next appointment with Katherine Olson.  He was not able to bring her into the office on  Friday, March 11th,or Wednesday, March 16th.

## 2014-05-10 NOTE — Telephone Encounter (Signed)
Spoke with patients son,

## 2014-05-10 NOTE — Telephone Encounter (Signed)
Spoke with [name], patient son.  Says Katherine Olson has been complaining of dizzy spell for sometime, and because of the dizzy spell  Sherrie Mustache, NP reduce patient dose from Namenda 14mg , to Namenda 7 mg.  She is still having the dizzy spells (not as bad per [name]).  Mrs. Mergen is also having issues with her ears being stopped up. Message to Janett Billow, NP. I have already addressed the concerns addressed below with son. He wanted to transfer PCP, to  Dr. Gildardo Cranker.  He did not want to schedule an appointment with Dr. Eulas Post at the time..Carolin Coy

## 2014-05-10 NOTE — Telephone Encounter (Signed)
Stated that his mother was doing just fine before coming to our practice. Stated that she was doing just fine before you placed her on Namenda. Very upset. Keeps getting the run around. Stated that while he was waiting in the waiting room a patient told him not to establish with our practice because we were no good and didn't help our patient and that he should have listened because his mother has gone down hill ever since. Not coming back into the office for her to just  be seen. Thinks Dr. Mariea Clonts is getting a kick back with putting her on Namenda. Son stated that he doesn't like Dr. Mariea Clonts. Patient son transferred to Caren Griffins to speak with.

## 2014-05-10 NOTE — Telephone Encounter (Signed)
Discussed with Janett Billow the request to a Specialist for the ear problem.  Says the ear problem could be related to [facility],  says to discontinue the Namenda and schedule an appointment with her PCP to discuss further.  [name] (son) has agreed to discontinue medical care at [facility].  Says he has transferring Mrs. Poust care to another PCP.

## 2014-05-16 ENCOUNTER — Ambulatory Visit (INDEPENDENT_AMBULATORY_CARE_PROVIDER_SITE_OTHER): Payer: Medicare HMO | Admitting: Nurse Practitioner

## 2014-05-16 ENCOUNTER — Encounter: Payer: Self-pay | Admitting: Nurse Practitioner

## 2014-05-16 VITALS — BP 148/78 | HR 65 | Temp 97.8°F | Resp 12 | Wt 165.8 lb

## 2014-05-16 DIAGNOSIS — R21 Rash and other nonspecific skin eruption: Secondary | ICD-10-CM | POA: Diagnosis not present

## 2014-05-16 DIAGNOSIS — Z23 Encounter for immunization: Secondary | ICD-10-CM | POA: Diagnosis not present

## 2014-05-16 DIAGNOSIS — G309 Alzheimer's disease, unspecified: Secondary | ICD-10-CM | POA: Diagnosis not present

## 2014-05-16 DIAGNOSIS — I10 Essential (primary) hypertension: Secondary | ICD-10-CM

## 2014-05-16 DIAGNOSIS — F0280 Dementia in other diseases classified elsewhere without behavioral disturbance: Secondary | ICD-10-CM

## 2014-05-16 MED ORDER — METHYLPREDNISOLONE (PAK) 4 MG PO TABS: ORAL_TABLET | ORAL | Status: DC

## 2014-05-16 NOTE — Patient Instructions (Signed)
See you in 3 months!   Nice to meet you.

## 2014-05-16 NOTE — Progress Notes (Signed)
Subjective:    Patient ID: Katherine Olson, female    DOB: Jan 01, 1927, 81 y.o.   MRN: [id number]  HPI  Katherine Olson is a 81 yo female with a CC of itchy rash on left hand chin x 3 days. She is coming to [place] from [facility]. She is accompanied by her son who is an adjunct historian.   1) Pt and son have questions about medications. She is no longer taking the Namenda XR. He is questioning diagnosis of alzheimer's disease and feels she needs another/further testing.   2) Rash on wrists and chin after working in the garden. Wore rubber gloves and the rash is right where they came up to. Does not extend onto hands.  Itching worse last night  Calamine lotion- minor help   Review of Systems  Constitutional: Negative for fever, chills, diaphoresis and fatigue.  Respiratory: Negative for chest tightness, shortness of breath and wheezing.   Cardiovascular: Negative for chest pain, palpitations and leg swelling.  Gastrointestinal: Negative for nausea, vomiting and diarrhea.  Skin: Positive for rash.       Bilateral wrists and chin  Neurological: Negative for dizziness, weakness, numbness and headaches.  Psychiatric/Behavioral: The patient is not nervous/anxious.    Past Medical History  Diagnosis Date  . Hyperlipidemia   . Hypertension     History   Social History  . Marital Status: Married    Spouse Name: N/A  . Number of Children: N/A  . Years of Education: N/A   Occupational History  .      housewife   Social History Main Topics  . Smoking status: Never Smoker   . Smokeless tobacco: Not on file  . Alcohol Use: No  . Drug Use: No  . Sexual Activity: Not on file   Other Topics Concern  . Not on file   Social History Narrative   Her son lives with her and cooks her meals   Her son also does her finances    Past Surgical History  Procedure Laterality Date  . Abdominal hysterectomy      partial  . Joint replacement Right 1991    hip    Family History    Problem Relation Age of Onset  . Alzheimer's disease Brother   . Heart disease Sister   . Cancer Sister     kidney  . Cancer Mother     liver  . Cancer Father     prostate    Allergies  Allergen Reactions  . Zocor [Simvastatin] Other (See Comments)    Stiffness in joints    Current Outpatient Prescriptions on File Prior to Visit  Medication Sig Dispense Refill  . aspirin 81 MG tablet Take 81 mg by mouth daily.    . citalopram (CELEXA) 20 MG tablet TAKE 1 TABLET BY MOUTH DAILY AS NEEDED FOR DEPRESSION 90 tablet 0  . Coenzyme Q10 (CO Q-10) 200 MG CAPS Take 1 tablet by mouth daily.     Marland Kitchen ezetimibe (ZETIA) 10 MG tablet Take 1 tablet (10 mg total) by mouth daily. 90 tablet 0  . LORazepam (ATIVAN) 0.5 MG tablet Take 0.5 mg by mouth every 8 (eight) hours.    Marland Kitchen losartan-hydrochlorothiazide (HYZAAR) 50-12.5 MG per tablet Take 1 tablet by mouth daily. 90 tablet 2  . Multiple Vitamins-Minerals (MULTIVITAMIN WITH MINERALS) tablet Take 1 tablet by mouth daily.    . niacin (NIASPAN) 500 MG CR tablet Take 1,000 mg by mouth at bedtime.     Marland Kitchen  Omega 3 1200 MG CAPS Take 1,200 mg by mouth 2 (two) times daily.      No current facility-administered medications on file prior to visit.       Objective:   Physical Exam  Constitutional: She is oriented to person, place, and time. She appears well-developed and well-nourished. No distress.  BP 148/78 mmHg  Pulse 65  Temp(Src) 97.8 F (36.6 C) (Oral)  Resp 12  Wt 165 lb 12.8 oz (75.206 kg)  SpO2 95%   HENT:  Head: Normocephalic and atraumatic.  Right Ear: External ear normal.  Left Ear: External ear normal.  Cardiovascular: Normal rate, regular rhythm, normal heart sounds and intact distal pulses.  Exam reveals no gallop and no friction rub.   No murmur heard. Pulmonary/Chest: Effort normal and breath sounds normal. No respiratory distress. She has no wheezes. She has no rales. She exhibits no tenderness.  Neurological: She is alert and  oriented to person, place, and time. No cranial nerve deficit. She exhibits normal muscle tone. Coordination normal.  Skin: Skin is warm and dry. No rash noted. She is not diaphoretic.     Psychiatric: She has a normal mood and affect. Her behavior is normal. Judgment and thought content normal.      Assessment & Plan:

## 2014-05-16 NOTE — Progress Notes (Signed)
Pre visit review using our clinic review tool, if applicable. No additional management support is needed unless otherwise documented below in the visit note. 

## 2014-05-20 DIAGNOSIS — R21 Rash and other nonspecific skin eruption: Secondary | ICD-10-CM | POA: Insufficient documentation

## 2014-05-20 NOTE — Assessment & Plan Note (Signed)
Prednisone taper sent to pharmacy. FU prn worsening/failure to improve.

## 2014-05-20 NOTE — Assessment & Plan Note (Signed)
BP okay for age

## 2014-05-20 NOTE — Assessment & Plan Note (Signed)
Son is unsure of diagnosis. Would like another mental status test at a later date. Also, discussed possible referral to neurology.

## 2014-06-22 NOTE — Op Note (Signed)
PATIENT NAME:  Katherine Olson, Katherine Olson MR#:  [id number] DATE OF BIRTH:  1926/03/23  DATE OF PROCEDURE:  05/16/2012  PREOPERATIVE DIAGNOSIS: Cataract, left eye.   POSTOPERATIVE DIAGNOSIS: Cataract, left eye.  PROCEDURE PERFORMED:  Extracapsular cataract extraction using phacoemulsification with placement of an Alcon [id number], 22.0-diopter posterior chamber lens, serial [id number].  SURGEON:  Loura Back. Reyaansh Merlo, MD  ASSISTANT:  None.  ANESTHESIA:  4% lidocaine and 0.75% Marcaine in a 50/50 mixture with 10 units/mL of Hylenex added, given as a peribulbar.  ANESTHESIOLOGIST:  Dr. Andree Elk  COMPLICATIONS:  None.  ESTIMATED BLOOD LOSS:  Less than 1 mL.  DESCRIPTION OF PROCEDURE:  The patient was brought to the operating room and given a peribulbar block.  The patient was then prepped and draped in the usual fashion.  The vertical rectus muscles were imbricated using 5-0 silk sutures.  These sutures were then clamped to the sterile drapes as bridle sutures.  A limbal peritomy was performed extending two clock hours and hemostasis was obtained with cautery.  A partial thickness scleral groove was made at the surgical limbus and dissected anteriorly in a lamellar dissection using an Alcon crescent knife.  The anterior chamber was entered supero-temporally with a Superblade and through the lamellar dissection with a 2.6 mm keratome.  DisCoVisc was used to replace the aqueous and a continuous tear capsulorrhexis was carried out.  Hydrodissection and hydrodelineation were carried out with balanced salt and a 27 gauge canula.  The nucleus was rotated to confirm the effectiveness of the hydrodissection.  Phacoemulsification was carried out using a divide-and-conquer technique.  Total ultrasound time was 1 minute and 19 seconds with an average power of 23.1 percent. CDE was 31.89.  Irrigation/aspiration was used to remove the residual cortex.  DisCoVisc was used to inflate the capsule and the internal incision  was enlarged to 3 mm with the crescent knife.  The intraocular lens was folded and inserted into the capsular bag using the [employer].  Irrigation/aspiration was used to remove the residual DisCoVisc.  Miostat was injected into the anterior chamber through the paracentesis track to inflate the anterior chamber and induce miosis. Cefuroxime, 0.1 mL, was placed in the posterior aspect of the anterior chamber.  The wound was checked for leaks and wound leakage was found.  A single 10-0 suture was placed across the incision, tied and the knot was rotated superiorly.  The conjunctiva was closed with cautery and the bridle sutures were removed.  Two drops of 0.3% Vigamox were placed on the eye.   An eye shield was placed on the eye.  The patient was discharged to the recovery room in good condition.  ____________________________ Loura Back Maiko Salais, MD sad:cb D: 05/16/2012 14:33:29 ET T: 05/16/2012 20:51:37 ET JOB#: [id number]  cc: Remo Lipps A. Sanjiv Castorena, MD, <Dictator> Martie Lee MD ELECTRONICALLY SIGNED 05/23/2012 14:15

## 2014-07-03 ENCOUNTER — Other Ambulatory Visit: Payer: Self-pay | Admitting: Internal Medicine

## 2014-07-26 ENCOUNTER — Other Ambulatory Visit: Payer: Self-pay | Admitting: Nurse Practitioner

## 2014-07-26 ENCOUNTER — Other Ambulatory Visit: Payer: Self-pay | Admitting: Internal Medicine

## 2014-08-02 ENCOUNTER — Other Ambulatory Visit: Payer: Self-pay | Admitting: Nurse Practitioner

## 2014-08-13 ENCOUNTER — Telehealth: Payer: Self-pay | Admitting: Nurse Practitioner

## 2014-08-13 NOTE — Telephone Encounter (Signed)
Wanting to have a full panel labs done at her appointment tomorrow . She stated she will come in fasting.

## 2014-08-14 ENCOUNTER — Ambulatory Visit (INDEPENDENT_AMBULATORY_CARE_PROVIDER_SITE_OTHER): Payer: Medicare HMO | Admitting: Nurse Practitioner

## 2014-08-14 ENCOUNTER — Encounter: Payer: Self-pay | Admitting: Nurse Practitioner

## 2014-08-14 DIAGNOSIS — Z13820 Encounter for screening for osteoporosis: Secondary | ICD-10-CM

## 2014-08-14 DIAGNOSIS — R413 Other amnesia: Secondary | ICD-10-CM

## 2014-08-14 LAB — COMPREHENSIVE METABOLIC PANEL
ALT: 11 U/L (ref 0–35)
AST: 17 U/L (ref 0–37)
Albumin: 4.3 g/dL (ref 3.5–5.2)
Alkaline Phosphatase: 53 U/L (ref 39–117)
BILIRUBIN TOTAL: 0.5 mg/dL (ref 0.2–1.2)
BUN: 19 mg/dL (ref 6–23)
CALCIUM: 10 mg/dL (ref 8.4–10.5)
CHLORIDE: 106 meq/L (ref 96–112)
CO2: 28 mEq/L (ref 19–32)
CREATININE: 1.09 mg/dL (ref 0.40–1.20)
GFR: 61.18 mL/min (ref >60.00)
Glucose, Bld: 88 mg/dL (ref 70–99)
Potassium: 4.3 mEq/L (ref 3.5–5.1)
Sodium: 139 mEq/L (ref 135–145)
Total Protein: 6.8 g/dL (ref 6.0–8.3)

## 2014-08-14 LAB — LIPID PANEL
Cholesterol: 217 mg/dL — ABNORMAL HIGH (ref 0–200)
HDL: 72 mg/dL (ref >39.00)
LDL Cholesterol: 130 mg/dL — ABNORMAL HIGH (ref 0–99)
NONHDL: 145
Total CHOL/HDL Ratio: 3
Triglycerides: 74 mg/dL (ref 0.0–149.0)
VLDL: 14.8 mg/dL (ref 0.0–40.0)

## 2014-08-14 LAB — CBC WITH DIFFERENTIAL/PLATELET
Basophils Absolute: 0.1 10*3/uL (ref 0.0–0.1)
Basophils Relative: 0.8 % (ref 0.0–3.0)
EOS PCT: 1.8 % (ref 0.0–5.0)
Eosinophils Absolute: 0.1 10*3/uL (ref 0.0–0.7)
HEMATOCRIT: 39.3 % (ref 36.0–46.0)
HEMOGLOBIN: 13.3 g/dL (ref 12.0–15.0)
LYMPHS ABS: 2 10*3/uL (ref 0.7–4.0)
LYMPHS PCT: 28.1 % (ref 12.0–46.0)
MCHC: 33.8 g/dL (ref 30.0–36.0)
MCV: 91.5 fl (ref 78.0–100.0)
MONOS PCT: 8.1 % (ref 3.0–12.0)
Monocytes Absolute: 0.6 10*3/uL (ref 0.1–1.0)
NEUTROS ABS: 4.3 10*3/uL (ref 1.4–7.7)
Neutrophils Relative %: 61.2 % (ref 43.0–77.0)
Platelets: 221 10*3/uL (ref 150.0–400.0)
RBC: 4.3 Mil/uL (ref 3.87–5.11)
RDW: 13.3 % (ref 11.5–15.5)
WBC: 7 10*3/uL (ref 4.0–10.5)

## 2014-08-14 LAB — TSH: TSH: 2.02 u[IU]/mL (ref 0.35–4.50)

## 2014-08-14 LAB — HEMOGLOBIN A1C: HEMOGLOBIN A1C: 5.7 % (ref 4.6–6.5)

## 2014-08-14 NOTE — Progress Notes (Signed)
   Subjective:    Patient ID: Katherine Olson, female    DOB: Nov 27, 1926, 81 y.o.   MRN: [id number]  HPI  Ms. Bracher is a 81 yo female following up for labs and son is with her today. He wanted memory test today, but refuses to let me tell her that is what it is and why she is here.   Glaucoma- Eye, last year  Hearing- Needs hearing aides, but won't buy them, [facility]    She was tested with MMSE and started on Namenda in the past. The son does not feel this was correct and is adamant she does not have alzheimers. He reports he looks after her and denies wandering behavior, agitation, and states her problem is just forgetfulness.   Activities of Daily Living Patient can do her own household chores. Denies needing assistance with: driving, feeding themselves, getting from bed to chair, getting to the toilet, bathing/showering, dressing, managing money, climbing flight of stairs, or preparing meals.   Memory Screen- [facility] testing today. Sheet to scan  Review of Systems  Constitutional: Negative for fever, chills, diaphoresis and fatigue.  Respiratory: Negative for chest tightness, shortness of breath and wheezing.   Cardiovascular: Negative for chest pain, palpitations and leg swelling.  Gastrointestinal: Negative for nausea, vomiting, diarrhea and rectal pain.  Skin: Negative for rash.  Neurological: Negative for dizziness, weakness, numbness and headaches.  Psychiatric/Behavioral: Positive for confusion. The patient is not nervous/anxious.       Objective:   Physical Exam  Constitutional: She appears well-developed and well-nourished. No distress.  HENT:  Head: Normocephalic and atraumatic.  Right Ear: External ear normal.  Left Ear: External ear normal.  Cardiovascular: Normal rate, regular rhythm, normal heart sounds and intact distal pulses.  Exam reveals no gallop and no friction rub.   No murmur heard. Pulmonary/Chest: Effort normal and breath sounds normal. No respiratory  distress. She has no wheezes. She has no rales. She exhibits no tenderness.  Neurological: She is alert. No cranial nerve deficit. She exhibits normal muscle tone. Coordination normal.  Oriented to person and place, not time  Skin: Skin is warm and dry. No rash noted. She is not diaphoretic.  Psychiatric: She has a normal mood and affect. Her behavior is normal. Judgment and thought content normal.      Assessment & Plan:

## 2014-08-14 NOTE — Patient Instructions (Addendum)
Follow up in 6 months for general follow up.  We will contact you about your lab results and scheduling for bone density scan.

## 2014-08-14 NOTE — Progress Notes (Signed)
Pre visit review using our clinic review tool, if applicable. No additional management support is needed unless otherwise documented below in the visit note. 

## 2014-08-16 ENCOUNTER — Ambulatory Visit: Payer: Medicare HMO | Admitting: Nurse Practitioner

## 2014-08-19 DIAGNOSIS — Z0000 Encounter for general adult medical examination without abnormal findings: Secondary | ICD-10-CM | POA: Insufficient documentation

## 2014-08-19 NOTE — Assessment & Plan Note (Addendum)
MOCA testing- 18/30 score showing decreased cognitive abilities. MMSE in Feb 2016 by Dr. Mariea Clonts shows 21/30. I feel pt does have mild dementia. She is anxious when being tested, but states "I don't want or need to know the answers". Asked her son to continue to look for signs of decline and watch for her safety. FU in 6 months.

## 2014-08-19 NOTE — Assessment & Plan Note (Signed)
Patient needs dexa scan. Order placed. Labs being checked today. Will follow

## 2014-08-22 ENCOUNTER — Telehealth: Payer: Self-pay | Admitting: Nurse Practitioner

## 2014-08-22 NOTE — Telephone Encounter (Signed)
Pt son-Dale called to get lab results. Please advise pt son/msn

## 2014-08-22 NOTE — Telephone Encounter (Signed)
Spoke with pts son, advised labs normal per Lorane Gell.  Verbalized understanding

## 2014-09-10 ENCOUNTER — Telehealth: Payer: Self-pay | Admitting: *Deleted

## 2014-09-10 NOTE — Telephone Encounter (Signed)
Pt called requesting Lorazepam refill.  Last OV 6.14.16.  Review of chart it appears that Lorazepam has not been prescribed by you.  Please advise refill

## 2014-09-11 ENCOUNTER — Other Ambulatory Visit: Payer: Self-pay | Admitting: *Deleted

## 2014-09-11 MED ORDER — LORAZEPAM 0.5 MG PO TABS: 0.5000 mg | ORAL_TABLET | Freq: Every day | ORAL | Status: DC

## 2014-09-11 NOTE — Telephone Encounter (Signed)
Rx faxed

## 2014-09-11 NOTE — Telephone Encounter (Signed)
Lorazepam 0.5 mg one tablet by mouth at bedtime # 30 with 2 refills. Thanks!

## 2014-10-02 ENCOUNTER — Ambulatory Visit: Payer: Medicare HMO

## 2014-10-09 ENCOUNTER — Ambulatory Visit: Payer: Medicare HMO

## 2014-10-09 ENCOUNTER — Other Ambulatory Visit: Payer: Medicare HMO

## 2014-10-11 ENCOUNTER — Ambulatory Visit
Admission: RE | Admit: 2014-10-11 | Discharge: 2014-10-11 | Disposition: A | Discharge status: Home / Self Care | Payer: Commercial Managed Care - HMO | Source: Ambulatory Visit | Attending: Nurse Practitioner | Admitting: Nurse Practitioner

## 2014-10-11 DIAGNOSIS — Z13820 Encounter for screening for osteoporosis: Secondary | ICD-10-CM | POA: Insufficient documentation

## 2014-10-11 DIAGNOSIS — Z961 Presence of intraocular lens: Secondary | ICD-10-CM | POA: Diagnosis not present

## 2014-10-11 DIAGNOSIS — M8580 Other specified disorders of bone density and structure, unspecified site: Secondary | ICD-10-CM | POA: Insufficient documentation

## 2014-10-11 DIAGNOSIS — M25852 Other specified joint disorders, left hip: Secondary | ICD-10-CM | POA: Diagnosis not present

## 2014-11-06 ENCOUNTER — Telehealth: Payer: Self-pay | Admitting: Nurse Practitioner

## 2014-11-06 ENCOUNTER — Other Ambulatory Visit: Payer: Self-pay | Admitting: Nurse Practitioner

## 2014-11-06 NOTE — Telephone Encounter (Signed)
Last OV 6.14.16.  Please advise refill 

## 2014-11-06 NOTE — Telephone Encounter (Signed)
This is a duplicate message, refill request also came from pharmacy

## 2014-11-06 NOTE — Telephone Encounter (Signed)
Pt son called about Ms Angelea not having anymore refills for her medication. Pt son is requesting more than a 30 day 2-3 refills if possible. Pharmacy is [facility] in [place]. Thank You!

## 2014-11-26 ENCOUNTER — Telehealth: Payer: Self-pay | Admitting: Nurse Practitioner

## 2014-11-26 ENCOUNTER — Other Ambulatory Visit: Payer: Self-pay

## 2014-11-26 MED ORDER — LOSARTAN POTASSIUM-HCTZ 50-12.5 MG PO TABS: 1.0000 | ORAL_TABLET | Freq: Every day | ORAL | Status: DC

## 2014-11-26 NOTE — Telephone Encounter (Signed)
Sent refill in per request.

## 2014-11-26 NOTE — Telephone Encounter (Signed)
Pt son called about pt needing another prescription. Pt ran out as of 11/27/2014. Medication is Losartan BP. Pharmacy is [facility]. Thank You!

## 2015-01-05 ENCOUNTER — Other Ambulatory Visit: Payer: Self-pay | Admitting: Nurse Practitioner

## 2015-01-28 ENCOUNTER — Telehealth: Payer: Self-pay | Admitting: *Deleted

## 2015-01-29 ENCOUNTER — Ambulatory Visit: Payer: Self-pay | Admitting: Nurse Practitioner

## 2015-02-01 ENCOUNTER — Encounter: Payer: Self-pay | Admitting: Nurse Practitioner

## 2015-02-01 ENCOUNTER — Ambulatory Visit (INDEPENDENT_AMBULATORY_CARE_PROVIDER_SITE_OTHER): Payer: Commercial Managed Care - HMO | Admitting: Nurse Practitioner

## 2015-02-01 VITALS — BP 138/72 | HR 84 | Temp 97.7°F | Wt 166.3 lb

## 2015-02-01 DIAGNOSIS — F0280 Dementia in other diseases classified elsewhere without behavioral disturbance: Secondary | ICD-10-CM

## 2015-02-01 DIAGNOSIS — N3941 Urge incontinence: Secondary | ICD-10-CM

## 2015-02-01 DIAGNOSIS — R413 Other amnesia: Secondary | ICD-10-CM

## 2015-02-01 DIAGNOSIS — E785 Hyperlipidemia, unspecified: Secondary | ICD-10-CM | POA: Diagnosis not present

## 2015-02-01 DIAGNOSIS — I10 Essential (primary) hypertension: Secondary | ICD-10-CM | POA: Diagnosis not present

## 2015-02-01 DIAGNOSIS — F329 Major depressive disorder, single episode, unspecified: Secondary | ICD-10-CM

## 2015-02-01 DIAGNOSIS — F4321 Adjustment disorder with depressed mood: Secondary | ICD-10-CM | POA: Diagnosis not present

## 2015-02-01 DIAGNOSIS — G301 Alzheimer's disease with late onset: Secondary | ICD-10-CM

## 2015-02-01 LAB — BASIC METABOLIC PANEL
BUN: 27 mg/dL — ABNORMAL HIGH (ref 6–23)
CO2: 27 mEq/L (ref 19–32)
Calcium: 10 mg/dL (ref 8.4–10.5)
Chloride: 106 mEq/L (ref 96–112)
Creatinine, Ser: 1.25 mg/dL — ABNORMAL HIGH (ref 0.40–1.20)
GFR: 52.18 mL/min — AB (ref >60.00)
Glucose, Bld: 93 mg/dL (ref 70–99)
POTASSIUM: 4 meq/L (ref 3.5–5.1)
SODIUM: 142 meq/L (ref 135–145)

## 2015-02-01 LAB — POCT URINALYSIS DIPSTICK
BILIRUBIN UA: NEGATIVE
Glucose, UA: NEGATIVE
KETONES UA: NEGATIVE
Nitrite, UA: NEGATIVE
PH UA: 7
Protein, UA: NEGATIVE
SPEC GRAV UA: 1.02
Urobilinogen, UA: 0.2

## 2015-02-01 LAB — CBC WITH DIFFERENTIAL/PLATELET
BASOS ABS: 0.1 10*3/uL (ref 0.0–0.1)
Basophils Relative: 0.7 % (ref 0.0–3.0)
EOS PCT: 2.5 % (ref 0.0–5.0)
Eosinophils Absolute: 0.2 10*3/uL (ref 0.0–0.7)
HCT: 39.5 % (ref 36.0–46.0)
Hemoglobin: 13.2 g/dL (ref 12.0–15.0)
Lymphocytes Relative: 29.5 % (ref 12.0–46.0)
Lymphs Abs: 2.2 10*3/uL (ref 0.7–4.0)
MCHC: 33.4 g/dL (ref 30.0–36.0)
MCV: 91 fl (ref 78.0–100.0)
MONO ABS: 0.6 10*3/uL (ref 0.1–1.0)
MONOS PCT: 8.2 % (ref 3.0–12.0)
Neutro Abs: 4.4 10*3/uL (ref 1.4–7.7)
Neutrophils Relative %: 59.1 % (ref 43.0–77.0)
Platelets: 211 10*3/uL (ref 150.0–400.0)
RBC: 4.34 Mil/uL (ref 3.87–5.11)
RDW: 13.1 % (ref 11.5–15.5)
WBC: 7.4 10*3/uL (ref 4.0–10.5)

## 2015-02-01 LAB — LIPID PANEL
Cholesterol: 203 mg/dL — ABNORMAL HIGH (ref 0–200)
HDL: 67.6 mg/dL (ref >39.00)
LDL Cholesterol: 125 mg/dL — ABNORMAL HIGH (ref 0–99)
NONHDL: 135.61
Total CHOL/HDL Ratio: 3
Triglycerides: 52 mg/dL (ref 0.0–149.0)
VLDL: 10.4 mg/dL (ref 0.0–40.0)

## 2015-02-01 MED ORDER — CITALOPRAM HYDROBROMIDE 20 MG PO TABS: ORAL_TABLET | ORAL | Status: DC

## 2015-02-01 MED ORDER — LORAZEPAM 0.5 MG PO TABS
0.5000 mg | ORAL_TABLET | Freq: Every day | ORAL | Status: DC
Start: 2015-02-01 — End: 2016-03-04

## 2015-02-01 NOTE — Assessment & Plan Note (Signed)
Pt reports she is okay, son reports stable slight memory loss.

## 2015-02-01 NOTE — Assessment & Plan Note (Signed)
Pt is Oriented, but had a decreased cognitive abilities according to the [facility] test performed at last visit. Son reports she is stable. Discussed Aricept with son and pt. Namenda XR was not well received- unsure of reason. Will follow up in 3 months

## 2015-02-01 NOTE — Assessment & Plan Note (Signed)
BP Readings from Last 3 Encounters:  02/01/15 138/72  08/14/14 140/64  05/16/14 148/78   Will check BMET today. Declines need for refills

## 2015-02-01 NOTE — Patient Instructions (Signed)
Donepezil tablets What is this medicine? DONEPEZIL (doe NEP e zil) is used to treat mild to moderate dementia caused by Alzheimer's disease. This medicine may be used for other purposes; ask your health care provider or pharmacist if you have questions. What should I tell my health care provider before I take this medicine? They need to know if you have any of these conditions: -asthma or other lung disease -difficulty passing urine -head injury -heart disease -history of irregular heartbeat -liver disease -seizures (convulsions) -stomach or intestinal disease, ulcers or stomach bleeding -an unusual or allergic reaction to donepezil, other medicines, foods, dyes, or preservatives -pregnant or trying to get pregnant -breast-feeding How should I use this medicine? Take this medicine by mouth with a glass of water. Follow the directions on the prescription label. You may take this medicine with or without food. Take this medicine at regular intervals. This medicine is usually taken before bedtime. Do not take it more often than directed. Continue to take your medicine even if you feel better. Do not stop taking except on your doctor's advice. If you are taking the 23 mg donepezil tablet, swallow it whole; do not cut, crush, or chew it. Talk to your pediatrician regarding the use of this medicine in children. Special care may be needed. Overdosage: If you think you have taken too much of this medicine contact a poison control center or emergency room at once. NOTE: This medicine is only for you. Do not share this medicine with others. What if I miss a dose? If you miss a dose, take it as soon as you can. If it is almost time for your next dose, take only that dose, do not take double or extra doses. What may interact with this medicine? Do not take this medicine with any of the following medications: -certain medicines for fungal infections like itraconazole, fluconazole, posaconazole, and  voriconazole -cisapride -dextromethorphan; quinidine -dofetilide -dronedarone -pimozide -quinidine -thioridazine -ziprasidone This medicine may also interact with the following medications: -antihistamines for allergy, cough and cold -atropine -bethanechol -carbamazepine -certain medicines for bladder problems like oxybutynin, tolterodine -certain medicines for Parkinson's disease like benztropine, trihexyphenidyl -certain medicines for stomach problems like dicyclomine, hyoscyamine -certain medicines for travel sickness like scopolamine -dexamethasone -ipratropium -NSAIDs, medicines for pain and inflammation, like ibuprofen or naproxen -other medicines for Alzheimer's disease -other medicines that prolong the QT interval (cause an abnormal heart rhythm) -phenobarbital -phenytoin -rifampin, rifabutin or rifapentine This list may not describe all possible interactions. Give your health care provider a list of all the medicines, herbs, non-prescription drugs, or dietary supplements you use. Also tell them if you smoke, drink alcohol, or use illegal drugs. Some items may interact with your medicine. What should I watch for while using this medicine? Visit your doctor or health care professional for regular checks on your progress. Check with your doctor or health care professional if your symptoms do not get better or if they get worse. You may get drowsy or dizzy. Do not drive, use machinery, or do anything that needs mental alertness until you know how this drug affects you. What side effects may I notice from receiving this medicine? Side effects that you should report to your doctor or health care professional as soon as possible: -allergic reactions like skin rash, itching or hives, swelling of the face, lips, or tongue -changes in vision -feeling faint or lightheaded, falls -problems with balance -redness, blistering, peeling or loosening of the skin, including inside the  mouth -slow heartbeat,   or palpitations -stomach pain -unusual bleeding or bruising, red or purple spots on the skin -vomiting -weight loss Side effects that usually do not require medical attention (report to your doctor or health care professional if they continue or are bothersome): -diarrhea, especially when starting treatment -headache -indigestion or heartburn -loss of appetite -muscle cramps -nausea This list may not describe all possible side effects. Call your doctor for medical advice about side effects. You may report side effects to FDA at 1-800-FDA-1088. Where should I keep my medicine? Keep out of reach of children. Store at room temperature between 15 and 30 degrees C (59 and 86 degrees F). Throw away any unused medicine after the expiration date. NOTE: This sheet is a summary. It may not cover all possible information. If you have questions about this medicine, talk to your doctor, pharmacist, or health care provider.    2016, Elsevier/Gold Standard. (2013-09-28 07:51:52)  

## 2015-02-01 NOTE — Progress Notes (Signed)
Patient ID: Katherine Olson, female    DOB: Jul 24, 1926  Age: 81 y.o. MRN: [id number]  CC: Follow-up   HPI Katherine Olson presents for follow up of HTN, Cholesterol, and Memory loss. She is accompanied by her son today.  1) Cholesterol and BMET due today Diet- Eating the wrong foods son reports Exercising- Not active, she lives with son and does not stay active, but is social with friends.   138/72 Repeat BP (first one son was concerned was too low).  Urination issues- Pt denies urinary concerns at this time. She has a history of urge incontinence.    History Katherine Olson has a past medical history of Hyperlipidemia and Hypertension.   She has past surgical history that includes Abdominal hysterectomy and Joint replacement (Right, 1991).   Her family history includes Alzheimer's disease in her brother; Cancer in her father, mother, and sister; Heart disease in her sister.She reports that she has never smoked. She does not have any smokeless tobacco history on file. She reports that she does not drink alcohol or use illicit drugs.  Outpatient Prescriptions Prior to Visit  Medication Sig Dispense Refill  . aspirin 81 MG tablet Take 81 mg by mouth daily.    . Coenzyme Q10 (CO Q-10) 200 MG CAPS Take 1 tablet by mouth daily.     Marland Kitchen losartan-hydrochlorothiazide (HYZAAR) 50-12.5 MG per tablet Take 1 tablet by mouth daily. 90 tablet 2  . Multiple Vitamins-Minerals (MULTIVITAMIN WITH MINERALS) tablet Take 1 tablet by mouth daily.    . Omega 3 1200 MG CAPS Take 1,200 mg by mouth 2 (two) times daily.     Marland Kitchen ZETIA 10 MG tablet TAKE 1 TABLET BY MOUTH EACH DAY 90 tablet 1  . citalopram (CELEXA) 20 MG tablet TAKE ONE TABLET DAILY FOR DEPRESSION 90 tablet 0  . LORazepam (ATIVAN) 0.5 MG tablet Take 1 tablet (0.5 mg total) by mouth at bedtime. 30 tablet 2   No facility-administered medications prior to visit.    ROS Review of Systems  Constitutional: Negative for fever, chills, diaphoresis and fatigue.   HENT: Negative for tinnitus and trouble swallowing.   Eyes: Negative for visual disturbance.  Respiratory: Negative for chest tightness, shortness of breath and wheezing.   Cardiovascular: Negative for chest pain, palpitations and leg swelling.  Gastrointestinal: Negative for nausea, vomiting and diarrhea.  Genitourinary: Negative for dysuria, urgency, frequency, hematuria, flank pain and difficulty urinating.  Skin: Negative for rash.  Psychiatric/Behavioral: Negative for confusion. The patient is not nervous/anxious.    Objective:  BP 138/72 mmHg  Pulse 84  Temp(Src) 97.7 F (36.5 C) (Oral)  Wt 166 lb 4.8 oz (75.433 kg)  SpO2 96%  Physical Exam  Constitutional: She is oriented to person, place, and time. She appears well-developed and well-nourished. No distress.  HENT:  Head: Normocephalic and atraumatic.  Right Ear: External ear normal.  Left Ear: External ear normal.  Cardiovascular: Normal rate, regular rhythm and normal heart sounds.  Exam reveals no gallop and no friction rub.   No murmur heard. Pulmonary/Chest: Effort normal and breath sounds normal. No respiratory distress. She has no wheezes. She has no rales. She exhibits no tenderness.  Neurological: She is alert and oriented to person, place, and time. No cranial nerve deficit. She exhibits normal muscle tone. Coordination normal.  Skin: Skin is warm and dry. No rash noted. She is not diaphoretic.  Psychiatric: She has a normal mood and affect. Her behavior is normal. Judgment and thought content normal.  Assessment & Plan:   Katherine Olson was seen today for follow-up.  Diagnoses and all orders for this visit:  Hyperlipidemia -     Basic Metabolic Panel (BMET) -     Lipid Profile  Essential hypertension -     Basic Metabolic Panel (BMET) -     CBC with Differential/Platelet  Memory loss -     POCT Urinalysis Dipstick -     Urine Culture  Other orders -     LORazepam (ATIVAN) 0.5 MG tablet; Take 1 tablet (0.5  mg total) by mouth at bedtime. -     citalopram (CELEXA) 20 MG tablet; TAKE ONE TABLET DAILY FOR DEPRESSION   I am having Ms. Gramajo maintain her aspirin, Omega 3, Co Q-10, multivitamin with minerals, losartan-hydrochlorothiazide, ZETIA, LORazepam, and citalopram.  Meds ordered this encounter  Medications  . LORazepam (ATIVAN) 0.5 MG tablet    Sig: Take 1 tablet (0.5 mg total) by mouth at bedtime.    Dispense:  30 tablet    Refill:  2    Order Specific Question:  Supervising Provider    Answer:  Deborra Medina L [[id number]]  . citalopram (CELEXA) 20 MG tablet    Sig: TAKE ONE TABLET DAILY FOR DEPRESSION    Dispense:  90 tablet    Refill:  3    Order Specific Question:  Supervising Provider    Answer:  Crecencio Mc [[id number]]     Follow-up: Return in about 3 months (around 05/02/2015) for Follow up.

## 2015-02-01 NOTE — Assessment & Plan Note (Signed)
Denies further concerns. Urinalysis with culture will be obtained today. No tx until culture returns.

## 2015-02-01 NOTE — Assessment & Plan Note (Signed)
Checking today since she is fasting. Will up Zetia if still elevated.

## 2015-02-01 NOTE — Assessment & Plan Note (Signed)
Celexa and Ativan are helpful. Will refill today. FU in 3-6 months. Stable currently

## 2015-02-04 ENCOUNTER — Encounter: Payer: Self-pay | Admitting: Nurse Practitioner

## 2015-02-04 ENCOUNTER — Ambulatory Visit: Payer: Self-pay | Admitting: Nurse Practitioner

## 2015-02-05 ENCOUNTER — Other Ambulatory Visit: Payer: Self-pay | Admitting: Nurse Practitioner

## 2015-02-05 ENCOUNTER — Telehealth: Payer: Self-pay | Admitting: Nurse Practitioner

## 2015-02-05 ENCOUNTER — Encounter: Payer: Self-pay | Admitting: Nurse Practitioner

## 2015-02-05 NOTE — Telephone Encounter (Signed)
Pt.notified

## 2015-02-05 NOTE — Telephone Encounter (Signed)
Pt son called to check if her lab results are in. Pt got lab done on Friday. Thank You!

## 2015-02-06 ENCOUNTER — Encounter: Payer: Self-pay | Admitting: Nurse Practitioner

## 2015-02-07 ENCOUNTER — Other Ambulatory Visit: Payer: Self-pay | Admitting: Nurse Practitioner

## 2015-02-07 LAB — URINE CULTURE: Colony Count: >=100000

## 2015-02-07 MED ORDER — CIPROFLOXACIN HCL 250 MG PO TABS: 250.0000 mg | ORAL_TABLET | Freq: Two times a day (BID) | ORAL | Status: DC

## 2015-02-13 ENCOUNTER — Encounter: Payer: Self-pay | Admitting: Nurse Practitioner

## 2015-05-13 ENCOUNTER — Encounter: Payer: Self-pay | Admitting: Nurse Practitioner

## 2015-05-15 ENCOUNTER — Telehealth: Payer: Self-pay | Admitting: Nurse Practitioner

## 2015-05-15 NOTE — Telephone Encounter (Signed)
Pt son called to set up a AWV. Call son @ [phone]. Thank you!

## 2015-05-15 NOTE — Telephone Encounter (Signed)
Pt son request to have blood work drawn the day of appt. Due to his work schedule. Please order labs. Appt 08/08/15.msn

## 2015-05-16 ENCOUNTER — Other Ambulatory Visit: Payer: Self-pay | Admitting: Nurse Practitioner

## 2015-05-16 ENCOUNTER — Encounter: Payer: Self-pay | Admitting: Nurse Practitioner

## 2015-05-16 DIAGNOSIS — F411 Generalized anxiety disorder: Secondary | ICD-10-CM

## 2015-05-16 DIAGNOSIS — Z13820 Encounter for screening for osteoporosis: Secondary | ICD-10-CM

## 2015-05-16 DIAGNOSIS — E785 Hyperlipidemia, unspecified: Secondary | ICD-10-CM

## 2015-05-16 DIAGNOSIS — I10 Essential (primary) hypertension: Secondary | ICD-10-CM

## 2015-05-16 NOTE — Telephone Encounter (Signed)
Ordered. Thanks

## 2015-05-28 ENCOUNTER — Encounter: Payer: Self-pay | Admitting: Nurse Practitioner

## 2015-05-30 ENCOUNTER — Other Ambulatory Visit: Payer: Self-pay

## 2015-05-30 ENCOUNTER — Ambulatory Visit (INDEPENDENT_AMBULATORY_CARE_PROVIDER_SITE_OTHER): Payer: Commercial Managed Care - HMO | Admitting: Family Medicine

## 2015-05-30 ENCOUNTER — Ambulatory Visit (INDEPENDENT_AMBULATORY_CARE_PROVIDER_SITE_OTHER)
Admission: RE | Admit: 2015-05-30 | Discharge: 2015-05-30 | Disposition: A | Discharge status: Home / Self Care | Payer: Commercial Managed Care - HMO | Source: Ambulatory Visit | Attending: Family Medicine | Admitting: Family Medicine

## 2015-05-30 ENCOUNTER — Encounter: Payer: Self-pay | Admitting: Family Medicine

## 2015-05-30 VITALS — BP 138/84 | HR 58 | Temp 98.2°F | Wt 172.1 lb

## 2015-05-30 DIAGNOSIS — M545 Low back pain, unspecified: Secondary | ICD-10-CM | POA: Insufficient documentation

## 2015-05-30 DIAGNOSIS — M5136 Other intervertebral disc degeneration, lumbar region: Secondary | ICD-10-CM | POA: Diagnosis not present

## 2015-05-30 DIAGNOSIS — R3915 Urgency of urination: Secondary | ICD-10-CM | POA: Diagnosis not present

## 2015-05-30 LAB — POCT URINALYSIS DIPSTICK
Bilirubin, UA: NEGATIVE
Blood, UA: POSITIVE
GLUCOSE UA: NEGATIVE
KETONES UA: NEGATIVE
Nitrite, UA: NEGATIVE
Protein, UA: NEGATIVE
SPEC GRAV UA: 1.02
UROBILINOGEN UA: NEGATIVE
pH, UA: 6

## 2015-05-30 MED ORDER — LOSARTAN POTASSIUM-HCTZ 50-12.5 MG PO TABS: 1.0000 | ORAL_TABLET | Freq: Every day | ORAL | Status: DC

## 2015-05-30 NOTE — Progress Notes (Signed)
Subjective:  Patient ID: Katherine Olson, female    DOB: 11-18-1926  Age: 82 y.o. MRN: [id number]  CC: Low back pain  HPI:  82 year old female with a past medical history of urge incontinence and hypertension presents with complaints of low back pain/stiffness.  She is acommpanied by her son today.  Her son states that she's had complaints of back pain/stiffness for nearly a year. It appears to have worsened over the past few weeks that she's been doing yard work. She's been planting flowers, raking leaves, etc. She states that the predominant issue is that she is stiff. She states it does not feel like actual pain, just stiffness. She and her family are concerned about UTI. She has a history of urge incontinence and therefore has some urgency. No frequency. No dysuria. No associated fevers or chills. No flank pain. She has been taking Tylenol for her back with some improvement. She states that it seems to be worse after activity. No associated numbness or tingling in the lower dermis. No associated weakness. No other complains today.  Social Hx   Social History   Social History  . Marital Status: Married    Spouse Name: N/A  . Number of Children: N/A  . Years of Education: N/A   Occupational History  .      housewife   Social History Main Topics  . Smoking status: Never Smoker   . Smokeless tobacco: None  . Alcohol Use: No  . Drug Use: No  . Sexual Activity: Not Asked   Other Topics Concern  . None   Social History Narrative   Her son lives with her and cooks her meals   Her son also does her finances   Review of Systems  Constitutional: Negative.   Genitourinary: Positive for urgency. Negative for dysuria and frequency.  Musculoskeletal:       Low back stiffness.   Neurological: Negative for numbness.   Objective:  BP 138/84 mmHg  Pulse 58  Temp(Src) 98.2 F (36.8 C) (Oral)  Wt 172 lb 2 oz (78.075 kg)  SpO2 96%  BP/Weight 05/30/2015 02/01/2015 0000000    Systolic BP 0000000 0000000 [id number]  Diastolic BP 84 72 64  Wt. (Lbs) 172.13 166.3 165  BMI 27.79 26.85 26.64   Physical Exam  Constitutional: She appears well-developed. No distress.  Cardiovascular: Normal rate and regular rhythm.   Pulmonary/Chest: Effort normal and breath sounds normal. She has no wheezes. She has no rales.  Musculoskeletal:  Low back: Mild difficulty with ambulation. Decreased ROM. No discrete areas of tenderness. Negative straight leg raise.  Neurological: She is alert.  Normal LE muscle strength. 2+ patellar and achilles reflexes.  Psychiatric: She has a normal mood and affect.  Vitals reviewed.  Lab Results  Component Value Date   WBC 7.4 02/01/2015   HGB 13.2 02/01/2015   HCT 39.5 02/01/2015   PLT 211.0 02/01/2015   GLUCOSE 93 02/01/2015   CHOL 203* 02/01/2015   TRIG 52.0 02/01/2015   HDL 67.60 02/01/2015   LDLCALC 125* 02/01/2015   ALT 11 08/14/2014   AST 17 08/14/2014   NA 142 02/01/2015   K 4.0 02/01/2015   CL 106 02/01/2015   CREATININE 1.25* 02/01/2015   BUN 27* 02/01/2015   CO2 27 02/01/2015   TSH 2.02 08/14/2014   HGBA1C 5.7 08/14/2014   Results for orders placed or performed in visit on 05/30/15 (from the past 24 hour(s))  POCT Urinalysis Dipstick     Status:  Abnormal   Collection Time: 05/30/15  9:16 AM  Result Value Ref Range   Color, UA yello    Clarity, UA clear    Glucose, UA neg    Bilirubin, UA neg    Ketones, UA neg    Spec Grav, UA 1.020    Blood, UA pos    pH, UA 6.0    Protein, UA neg    Urobilinogen, UA negative    Nitrite, UA neg    Leukocytes, UA moderate (2+) (A) Negative   Assessment & Plan:   Problem List Items Addressed This Visit    Low back pain - Primary    New problem. Exam unremarkable. No signs of UTI. Will send culture. X-ray obtained today as my suspicion was this was secondary to OA/DJD. Lumbar spine x-ray was reviewed personally by me. Interpretation: Degenerative changes noted throughout the lumbar  spine most prominently L4/L5. Advised PRN Tylenol.  Could consider PT.      Relevant Orders   POCT Urinalysis Dipstick (Completed)   Urine Culture   DG Lumbar Spine Complete (Completed)     Follow-up: PRN  [place]

## 2015-05-30 NOTE — Assessment & Plan Note (Addendum)
New problem. Exam unremarkable. No signs of UTI. Will send culture. X-ray obtained today as my suspicion was this was secondary to OA/DJD. Lumbar spine x-ray was reviewed personally by me. Interpretation: Degenerative changes noted throughout the lumbar spine most prominently L4/L5. Advised PRN Tylenol.  Could consider PT.

## 2015-05-31 LAB — URINE CULTURE

## 2015-06-01 ENCOUNTER — Encounter: Payer: Self-pay | Admitting: Family Medicine

## 2015-06-02 ENCOUNTER — Encounter: Payer: Self-pay | Admitting: Family Medicine

## 2015-06-03 ENCOUNTER — Other Ambulatory Visit: Payer: Self-pay | Admitting: Family Medicine

## 2015-06-03 ENCOUNTER — Telehealth: Payer: Self-pay | Admitting: Nurse Practitioner

## 2015-06-03 DIAGNOSIS — M545 Low back pain: Secondary | ICD-10-CM

## 2015-06-03 NOTE — Telephone Encounter (Signed)
I spoke with [name], and reviewed his mother's test results.  Please advise for the referral for PT I see that you discussed at the [facility] , but I don't see an order and they would like it.  Thanks

## 2015-06-03 NOTE — Telephone Encounter (Signed)
Order placed

## 2015-06-03 NOTE — Telephone Encounter (Signed)
Pt son [name] called regarding his mom urine culture he states he saw it on Mychart but did not understand what he was reading. [name] needs more clarity of the results. [name] stated that Dr Lacinda Axon stated he was going to send in a referral for pt to get PT on lower back that last time she came in. Call dale @ [phone]. Thank you!

## 2015-06-19 ENCOUNTER — Ambulatory Visit: Payer: Commercial Managed Care - HMO

## 2015-07-07 ENCOUNTER — Encounter: Payer: Self-pay | Admitting: Family Medicine

## 2015-07-29 ENCOUNTER — Encounter: Payer: Self-pay | Admitting: Family Medicine

## 2015-07-30 ENCOUNTER — Other Ambulatory Visit: Payer: Self-pay | Admitting: Nurse Practitioner

## 2015-07-30 ENCOUNTER — Ambulatory Visit (INDEPENDENT_AMBULATORY_CARE_PROVIDER_SITE_OTHER): Payer: Commercial Managed Care - HMO | Admitting: Family

## 2015-07-30 ENCOUNTER — Encounter: Payer: Self-pay | Admitting: Family

## 2015-07-30 VITALS — BP 152/80 | HR 60 | Temp 98.6°F | Wt 172.6 lb

## 2015-07-30 DIAGNOSIS — R300 Dysuria: Secondary | ICD-10-CM | POA: Diagnosis not present

## 2015-07-30 DIAGNOSIS — N898 Other specified noninflammatory disorders of vagina: Secondary | ICD-10-CM

## 2015-07-30 LAB — POC URINALSYSI DIPSTICK (AUTOMATED)
Urobilinogen, UA: NEGATIVE
pH, UA: 6

## 2015-07-30 MED ORDER — SULFAMETHOXAZOLE-TRIMETHOPRIM 800-160 MG PO TABS: 1.0000 | ORAL_TABLET | Freq: Two times a day (BID) | ORAL | Status: DC

## 2015-07-30 NOTE — Progress Notes (Signed)
Subjective:    Patient ID: Katherine Olson, female    DOB: 1926-10-28, 82 y.o.   MRN: [id number]   Katherine Olson is a 82 y.o. female who presents today for an acute visit.    HPI Comments: Patient here for evaluation of lesion on vagina. States that it was bleeding from lesion this morning.  She is unsure how long it has been there, states for months to years. She is accompanied by Katherine Olson son. Patient has history of Alzheimer's. She denies any fevers, chills, weight loss. No history of cancer.  Patient also reports "pressure" when she urinates. Denies dysuria.     Past Medical History  Diagnosis Date  . Hyperlipidemia   . Hypertension    Allergies: Zocor Current Outpatient Prescriptions on File Prior to Visit  Medication Sig Dispense Refill  . aspirin 81 MG tablet Take 81 mg by mouth daily.    . citalopram (CELEXA) 20 MG tablet TAKE ONE TABLET DAILY FOR DEPRESSION 90 tablet 3  . Coenzyme Q10 (CO Q-10) 200 MG CAPS Take 1 tablet by mouth daily.     Marland Kitchen LORazepam (ATIVAN) 0.5 MG tablet Take 1 tablet (0.5 mg total) by mouth at bedtime. 30 tablet 2  . losartan-hydrochlorothiazide (HYZAAR) 50-12.5 MG tablet Take 1 tablet by mouth daily. 90 tablet 1  . Multiple Vitamins-Minerals (MULTIVITAMIN WITH MINERALS) tablet Take 1 tablet by mouth daily.    . Omega 3 1200 MG CAPS Take 1,200 mg by mouth 2 (two) times daily.     Marland Kitchen ZETIA 10 MG tablet TAKE 1 TABLET BY MOUTH EACH DAY 90 tablet 1   No current facility-administered medications on file prior to visit.    Social History  Substance Use Topics  . Smoking status: Never Smoker   . Smokeless tobacco: None  . Alcohol Use: No    Review of Systems  Constitutional: Negative for fever and chills.  Respiratory: Negative for cough.   Cardiovascular: Negative for chest pain and palpitations.  Gastrointestinal: Negative for nausea and vomiting.  Genitourinary: Negative for dysuria, hematuria, vaginal bleeding, vaginal discharge and vaginal pain.       Objective:    BP 152/80 mmHg  Pulse 60  Temp(Src) 98.6 F (37 C)  Wt 172 lb 9.6 oz (78.291 kg)  SpO2 97%   Physical Exam  Constitutional: She appears well-developed and well-nourished.  Eyes: Conjunctivae are normal.  Cardiovascular: Normal rate, regular rhythm, normal heart sounds and normal pulses.   Pulmonary/Chest: Effort normal and breath sounds normal. She has no wheezes. She has no rhonchi. She has no rales.  Genitourinary: There is lesion on the right labia. There is lesion on the left labia. No bleeding in the vagina. No vaginal discharge found.  Multiple smaller firm nodules right labia. Left labia 5cm firm nodule.  Bleeding from left nodule. No bleeding from vaginal canal.   Neurological: She is alert.  Skin: Skin is warm and dry.  Psychiatric: She has a normal mood and affect. Katherine Olson speech is normal and behavior is normal. Thought content normal.  Vitals reviewed.  Patient does not have previous history of MRSA.  Patient does not have diabetes.   The patient gave informed consent for the procedure. Patient and I discussed risks, benefits, and alternatives to I & D.  Including risk of infection from laceration, localized pain, and bleeding. We discussed the option for oral antibiotics. Patient verbalized understanding to conversation and all questions were answered. We jointly decided to proceed with procedure.  On exam, there is a 5 cm firm nodule on left labia.  It is tender to palpation.  There is erythema with bleeding discharge. It is not warm. There is no foul smell.   The patient gave informed consent for the procedure. The area was prepped with antiseptic solution. The area was anesthetized using lidocaine. The patient tolerated the anesthetic well.    #11 blade was used to incise the abscess.  Bloody drainage was noted. No purulent or sebaceous material came out of incision.   Bleeding from the wound was controlled by applying direct pressure with gauze.     A bandage was placed.    The patient tolerated the procedure well.  Extensive instructions were given to the patient.   Complications: None  Take antibiotic as directed.        Assessment & Plan:   1. Dysuria UA is positive for leukocytes and blood. Patient also reports "pressure". We will treat empirically with agent we are using for vaginal cyst v abscess . Pending culture.   - POCT Urinalysis Dipstick (Automated) - sulfamethoxazole-trimethoprim (BACTRIM DS,SEPTRA DS) 800-160 MG tablet; Take 1 tablet by mouth 2 (two) times daily.  Dispense: 10 tablet; Refill: 0  2. Vaginal cyst- left Working diagnosis of Bartholin's cyst. Cyst not appear infectious. I was unable to get sebaceous material to come out with simple I & D. Will cover empirically with antibiotic which will also cover urine. Referral to GYN. Son and patient agree with plan.   - Ambulatory referral to Gynecology - sulfamethoxazole-trimethoprim (BACTRIM DS,SEPTRA DS) 800-160 MG tablet; Take 1 tablet by mouth 2 (two) times daily.  Dispense: 10 tablet; Refill: 0    I have discontinued [name] ciprofloxacin. I am also having Katherine Olson maintain Katherine Olson aspirin, Omega 3, Co Q-10, multivitamin with minerals, ZETIA, LORazepam, citalopram, and losartan-hydrochlorothiazide.   No orders of the defined types were placed in this encounter.     Start medications as prescribed and explained to patient on After Visit Summary ( AVS). Risks, benefits, and alternatives of the medications and treatment plan prescribed today were discussed, and patient expressed understanding.   Education regarding symptom management and diagnosis given to patient.   Follow-up:Plan follow-up and return precautions given if any worsening symptoms or change in condition.   Continue to follow with Doss, Velora Heckler, NP for routine health maintenance.   Katherine Olson and I agreed with plan.   Mable Paris, FNP

## 2015-07-30 NOTE — Telephone Encounter (Signed)
Pt called about needing a refill for ZETIA 10 MG tablet.   Pharmacy is [facility], [place] - [address]  Call son @ [phone]. Thank you!

## 2015-07-31 ENCOUNTER — Telehealth: Payer: Self-pay | Admitting: *Deleted

## 2015-07-31 MED ORDER — EZETIMIBE 10 MG PO TABS: ORAL_TABLET | ORAL | Status: DC

## 2015-07-31 NOTE — Telephone Encounter (Signed)
Son called this morning to follow up on medication. Pt will run out in the next couple of days. Thank you!

## 2015-07-31 NOTE — Telephone Encounter (Signed)
Refilled

## 2015-07-31 NOTE — Telephone Encounter (Signed)
Patients son stated that he would like to pick  this script up today. He's worried that she will run out of medication

## 2015-08-01 ENCOUNTER — Ambulatory Visit (INDEPENDENT_AMBULATORY_CARE_PROVIDER_SITE_OTHER): Payer: Commercial Managed Care - HMO | Admitting: Obstetrics and Gynecology

## 2015-08-01 ENCOUNTER — Encounter: Payer: Self-pay | Admitting: Obstetrics and Gynecology

## 2015-08-01 VITALS — BP 132/63 | HR 84 | Ht 67.0 in | Wt 171.9 lb

## 2015-08-01 DIAGNOSIS — N907 Vulvar cyst: Secondary | ICD-10-CM | POA: Diagnosis not present

## 2015-08-01 LAB — URINE CULTURE

## 2015-08-01 NOTE — Patient Instructions (Signed)
1. Take sitz bath once or twice a day as desired 2. Dry the vulva with Hair dryer after washing/bath 3. Complete course of Septra DS antibiotic previously prescribed 4. Return in 4 weeks for follow-up   Diagnosis: Vulvar inclusion cysts-benign

## 2015-08-06 DIAGNOSIS — N907 Vulvar cyst: Secondary | ICD-10-CM | POA: Insufficient documentation

## 2015-08-06 NOTE — Progress Notes (Signed)
GYN ENCOUNTER NOTE  Subjective:       Katherine Olson is a 82 y.o. [id number] female is here for gynecologic evaluation of the following issues:  1. Referral for Possible Bartholin's cyst.     82 year old African-American female menopausal, status post hysterectomy, on no ERT therapy, presents in referral from here for evaluation of possible Bartholin's cyst. Patient states that she has had numerous cysts in the vulva in the past which tended to wax and wane. Patient is brought in by her son for further evaluation. She was seen on 07/30/2015 with I&D which was aborted because of no significant production of cyst fluid Gynecologic History No LMP recorded. Patient has had a hysterectomy. Contraception: status post hysterectomy  Obstetric History OB History  Gravida Para Term Preterm AB SAB TAB Ectopic Multiple Living  2 2 2       2     # Outcome Date GA Lbr Len/2nd Weight Sex Delivery Anes PTL Lv  2 Term 1956   7 lb (3.175 kg) M Vag-Spont   Y  1 Term 1953    M Vag-Spont   Y      Past Medical History  Diagnosis Date  . Hyperlipidemia   . Hypertension   . Depression     Past Surgical History  Procedure Laterality Date  . Joint replacement Right 1991    hip  . Abdominal hysterectomy      partial    Current Outpatient Prescriptions on File Prior to Visit  Medication Sig Dispense Refill  . aspirin 81 MG tablet Take 81 mg by mouth daily.    . citalopram (CELEXA) 20 MG tablet TAKE ONE TABLET DAILY FOR DEPRESSION 90 tablet 3  . Coenzyme Q10 (CO Q-10) 200 MG CAPS Take 1 tablet by mouth daily.     Marland Kitchen ezetimibe (ZETIA) 10 MG tablet TAKE 1 TABLET BY MOUTH EACH DAY 90 tablet 1  . LORazepam (ATIVAN) 0.5 MG tablet Take 1 tablet (0.5 mg total) by mouth at bedtime. 30 tablet 2  . losartan-hydrochlorothiazide (HYZAAR) 50-12.5 MG tablet Take 1 tablet by mouth daily. 90 tablet 1  . Multiple Vitamins-Minerals (MULTIVITAMIN WITH MINERALS) tablet Take 1 tablet by mouth daily.    . Omega 3 1200 MG  CAPS Take 1,200 mg by mouth 2 (two) times daily.     Marland Kitchen sulfamethoxazole-trimethoprim (BACTRIM DS,SEPTRA DS) 800-160 MG tablet Take 1 tablet by mouth 2 (two) times daily. 10 tablet 0   No current facility-administered medications on file prior to visit.    Allergies  Allergen Reactions  . Zocor [Simvastatin] Other (See Comments)    Stiffness in joints    Social History   Social History  . Marital Status: Married    Spouse Name: N/A  . Number of Children: N/A  . Years of Education: N/A   Occupational History  .      housewife   Social History Main Topics  . Smoking status: Never Smoker   . Smokeless tobacco: Not on file  . Alcohol Use: No  . Drug Use: No  . Sexual Activity: Yes    Birth Control/ Protection: Surgical   Other Topics Concern  . Not on file   Social History Narrative   Her son lives with her and cooks her meals   Her son also does her finances    Family History  Problem Relation Age of Onset  . Alzheimer's disease Brother   . Heart disease Sister   . Cancer Sister  kidney  . Cancer Mother     liver  . Cancer Father     prostate    The following portions of the patient's history were reviewed and updated as appropriate: allergies, current medications, past family history, past medical history, past social history, past surgical history and problem list.  Review of Systems Per history of present illness  Objective:   BP 132/63 mmHg  Pulse 84  Ht 5\' 7"  (1.702 m)  Wt 171 lb 14.4 oz (77.973 kg)  BMI 26.92 kg/m2 CONSTITUTIONAL: Well-developed, well-nourished female in no acute distress.  HENT:  Normocephalic, atraumatic.  NECK: Normal range of motion, supple, no masses.  Normal thyroid.  SKIN: Skin is warm and dry. No rash noted. Not diaphoretic. No erythema. No pallor. [place]: Alert and oriented to person, place, and time. RESPIRATORY: Not Examined BREASTS: Not Examined ABDOMEN: Soft, non distended; Non tender.  No  Organomegaly. PELVIC:  External Genitalia: Multiple labia majora inclusion cysts bilaterally; evidence is incision and drainage site healing without significant induration or fluctuance  BUS: Normal  Vagina: Atrophic changes  Cervix: Surgically absent  Uterus: Surgically absent  Adnexa: Normal  RV: Normal external exam  Bladder: Nontender   ASSESSMENT: 1. Multiple vulvar inclusion cysts, status post attempted incision and drainage.  PLAN: 1. Complete course of antibiotic as prescribed 2. Return in 2 weeks for follow-up examination 3. Reassurance given that there was no evidence of vulvar cancer at this time.  A total of 20 minutes were spent face-to-face with the patient during this encounter and over half of that time dealt with counseling and coordination of care.  Katherine Mars, MD  Note: This dictation was prepared with Dragon dictation along with smaller phrase technology. Any transcriptional errors that result from this process are unintentional.

## 2015-08-08 ENCOUNTER — Encounter: Payer: Self-pay | Admitting: Nurse Practitioner

## 2015-08-08 ENCOUNTER — Ambulatory Visit (INDEPENDENT_AMBULATORY_CARE_PROVIDER_SITE_OTHER): Payer: Commercial Managed Care - HMO

## 2015-08-08 ENCOUNTER — Ambulatory Visit (INDEPENDENT_AMBULATORY_CARE_PROVIDER_SITE_OTHER): Payer: Commercial Managed Care - HMO | Admitting: Family Medicine

## 2015-08-08 VITALS — BP 148/72 | HR 63 | Temp 98.1°F | Resp 14 | Ht 66.0 in | Wt 169.0 lb

## 2015-08-08 DIAGNOSIS — F411 Generalized anxiety disorder: Secondary | ICD-10-CM | POA: Diagnosis not present

## 2015-08-08 DIAGNOSIS — R300 Dysuria: Secondary | ICD-10-CM

## 2015-08-08 DIAGNOSIS — E785 Hyperlipidemia, unspecified: Secondary | ICD-10-CM

## 2015-08-08 DIAGNOSIS — Z13820 Encounter for screening for osteoporosis: Secondary | ICD-10-CM

## 2015-08-08 DIAGNOSIS — I10 Essential (primary) hypertension: Secondary | ICD-10-CM | POA: Diagnosis not present

## 2015-08-08 DIAGNOSIS — N952 Postmenopausal atrophic vaginitis: Secondary | ICD-10-CM

## 2015-08-08 DIAGNOSIS — Z0000 Encounter for general adult medical examination without abnormal findings: Secondary | ICD-10-CM

## 2015-08-08 LAB — COMPREHENSIVE METABOLIC PANEL
ALK PHOS: 49 U/L (ref 39–117)
ALT: 17 U/L (ref 0–35)
AST: 18 U/L (ref 0–37)
Albumin: 4.5 g/dL (ref 3.5–5.2)
BUN: 26 mg/dL — AB (ref 6–23)
CO2: 28 mEq/L (ref 19–32)
Calcium: 10.2 mg/dL (ref 8.4–10.5)
Chloride: 103 mEq/L (ref 96–112)
Creatinine, Ser: 1.45 mg/dL — ABNORMAL HIGH (ref 0.40–1.20)
GFR: 43.91 mL/min — ABNORMAL LOW (ref >60.00)
GLUCOSE: 102 mg/dL — AB (ref 70–99)
Potassium: 4.3 mEq/L (ref 3.5–5.1)
Sodium: 139 mEq/L (ref 135–145)
TOTAL PROTEIN: 7.1 g/dL (ref 6.0–8.3)
Total Bilirubin: 0.4 mg/dL (ref 0.2–1.2)

## 2015-08-08 LAB — POCT URINALYSIS DIP (MANUAL ENTRY)
BILIRUBIN UA: NEGATIVE
BILIRUBIN UA: NEGATIVE
Glucose, UA: NEGATIVE
Nitrite, UA: NEGATIVE
PH UA: 7
Protein Ur, POC: NEGATIVE
SPEC GRAV UA: 1.02
Urobilinogen, UA: 0.2

## 2015-08-08 LAB — LIPID PANEL
Cholesterol: 218 mg/dL — ABNORMAL HIGH (ref 0–200)
HDL: 72.8 mg/dL (ref >39.00)
LDL CALC: 130 mg/dL — AB (ref 0–99)
NonHDL: 144.98
Total CHOL/HDL Ratio: 3
Triglycerides: 76 mg/dL (ref 0.0–149.0)
VLDL: 15.2 mg/dL (ref 0.0–40.0)

## 2015-08-08 LAB — CBC WITH DIFFERENTIAL/PLATELET
BASOS ABS: 0.1 10*3/uL (ref 0.0–0.1)
Basophils Relative: 1 % (ref 0.0–3.0)
EOS ABS: 0.1 10*3/uL (ref 0.0–0.7)
Eosinophils Relative: 1.9 % (ref 0.0–5.0)
HEMATOCRIT: 40.1 % (ref 36.0–46.0)
HEMOGLOBIN: 13.3 g/dL (ref 12.0–15.0)
Lymphocytes Relative: 30.5 % (ref 12.0–46.0)
Lymphs Abs: 2.5 10*3/uL (ref 0.7–4.0)
MCHC: 33.2 g/dL (ref 30.0–36.0)
MCV: 92.3 fl (ref 78.0–100.0)
MONO ABS: 0.6 10*3/uL (ref 0.1–1.0)
Monocytes Relative: 7.3 % (ref 3.0–12.0)
Neutro Abs: 4.8 10*3/uL (ref 1.4–7.7)
Neutrophils Relative %: 59.3 % (ref 43.0–77.0)
Platelets: 245 10*3/uL (ref 150.0–400.0)
RBC: 4.34 Mil/uL (ref 3.87–5.11)
RDW: 13.4 % (ref 11.5–15.5)
WBC: 8.1 10*3/uL (ref 4.0–10.5)

## 2015-08-08 LAB — URINALYSIS, ROUTINE W REFLEX MICROSCOPIC
Bilirubin Urine: NEGATIVE
Ketones, ur: NEGATIVE
Leukocytes, UA: NEGATIVE
NITRITE: NEGATIVE
PH: 7 (ref 5.0–8.0)
SPECIFIC GRAVITY, URINE: 1.01 (ref 1.000–1.030)
TOTAL PROTEIN, URINE-UPE24: NEGATIVE
Urine Glucose: NEGATIVE
Urobilinogen, UA: 0.2 (ref 0.0–1.0)

## 2015-08-08 LAB — VITAMIN D 25 HYDROXY (VIT D DEFICIENCY, FRACTURES): VITD: 44.02 ng/mL (ref 30.00–100.00)

## 2015-08-08 MED ORDER — ESTROGENS, CONJUGATED 0.625 MG/GM VA CREA: 1.0000 | TOPICAL_CREAM | Freq: Every day | VAGINAL | Status: DC

## 2015-08-08 NOTE — Progress Notes (Signed)
Subjective:  Patient ID: Katherine Olson, female    DOB: [birth year]  Age: 82 y.o. MRN: [id number]  CC: Follow up.   HPI:  82 year old female presents for follow-up. She has also had an annual wellness today with our health coach.  She is accompanied by her son today who is her primary caregiver. Several concerns were brought up. They are as documented below.  Recurrent UTI, Dysuria  Son states that he is concerned about the fact that she's had "frequent UTIs".  Patient complains of burning/irritation with urination.  She has just completed antibiotic course after placed on it for a vulvar cyst.  No complaints of abdominal pain, fever, chills. She states that she is otherwise feeling well.  Patient does have urge incontinence.  No other associated symptoms or complaints regarding this.  HTN  Patient's blood pressure has been stable and at goal.  She is currently compliant with Hyzaar.  Her son is concerned that maybe we need to be more aggressive and aim for lower blood pressure. He would like to discuss this today.  HLD  Most recent lipid panel was in 2016. It revealed mildly elevated cholesterol. Total cholesterol 203. LDL 125.  She is currently taking Zetia without any issues.  She has been intolerant of statins in the past.  Her son is concerned about her elevated cholesterol would like to discuss treatment options today.  Social Hx   Social History   Social History  . Marital Status: Married    Spouse Name: N/A  . Number of Children: N/A  . Years of Education: N/A   Occupational History  .      housewife   Social History Main Topics  . Smoking status: Never Smoker   . Smokeless tobacco: Not on file  . Alcohol Use: No  . Drug Use: No  . Sexual Activity: Not Currently    Birth Control/ Protection: Surgical   Other Topics Concern  . Not on file   Social History Narrative   Her son lives with her and cooks her meals   Her son also does her finances     Review of Systems  Constitutional: Negative.   Gastrointestinal: Negative.   Genitourinary: Positive for dysuria.    Objective:  See below. Vitals documented earlier from wellness visit.  BP/Weight 08/08/2015 08/01/2015 [id number]  Systolic BP 123456 [id number] 0000000  Diastolic BP 72 63 80  Wt. (Lbs) 169 171.9 172.6  BMI 27.29 26.92 27.87   Physical Exam  Constitutional: She is oriented to person, place, and time. She appears well-developed. No distress.  Neck: Neck supple.  Cardiovascular: Normal rate and regular rhythm.   Pulmonary/Chest: Effort normal.  Abdominal: Soft. She exhibits no distension. There is no tenderness. There is no rebound and no guarding.  Lymphadenopathy:    She has no cervical adenopathy.  Neurological: She is alert and oriented to person, place, and time.  Psychiatric: She has a normal mood and affect.  Vitals reviewed.  Lab Results  Component Value Date   WBC 7.4 02/01/2015   HGB 13.2 02/01/2015   HCT 39.5 02/01/2015   PLT 211.0 02/01/2015   GLUCOSE 93 02/01/2015   CHOL 203* 02/01/2015   TRIG 52.0 02/01/2015   HDL 67.60 02/01/2015   LDLCALC 125* 02/01/2015   ALT 11 08/14/2014   AST 17 08/14/2014   NA 142 02/01/2015   K 4.0 02/01/2015   CL 106 02/01/2015   CREATININE 1.25* 02/01/2015   BUN 27* 02/01/2015  CO2 27 02/01/2015   TSH 2.02 08/14/2014   HGBA1C 5.7 08/14/2014    Assessment & Plan:   Problem List Items Addressed This Visit    Atrophic vaginitis    New problem. This is the likely source of her issues with dysuria. Vaginal atrophy has been commented on by GYN. Treating with Premarin.      Essential hypertension    At goal of <150/90. Discussed this with patient and son. Will continue Hyzaar. No changes.      Hyperlipidemia    Stable. Lipid panel today. Given stability, age, and statin intolerance will not pursue additional treatment. Discussed this in depth with patient's son today. Continue Zetia.      RESOLVED: Screening  for osteoporosis    Other Visit Diagnoses    Dysuria        Relevant Orders    POCT urinalysis dipstick (Completed)       Meds ordered this encounter  Medications  . conjugated estrogens (PREMARIN) vaginal cream    Sig: Place 1 Applicatorful vaginally daily.    Dispense:  42.5 g    Refill:  3    Follow-up: 6 months  [place]

## 2015-08-08 NOTE — Assessment & Plan Note (Signed)
At goal of <150/90. Discussed this with patient and son. Will continue Hyzaar. No changes.

## 2015-08-08 NOTE — Patient Instructions (Addendum)
Ms. Leclear , Thank you for taking time to come for your Medicare Wellness Visit. I appreciate your ongoing commitment to your health goals. Please review the following plan we discussed and let me know if I can assist you in the future.   Physical with Dr. Lacinda Axon today. Labs today.   This is a list of the screening recommended for you and due dates:  Health Maintenance  Topic Date Due  . Flu Shot  10/01/2015  . Tetanus Vaccine  05/15/2024  . DEXA scan (bone density measurement)  Completed  . Shingles Vaccine  Addressed  . Pneumonia vaccines  Completed    Hearing Loss Hearing loss is a partial or total loss of the ability to hear. This can be temporary or permanent, and it can happen in one or both ears. Hearing loss may be referred to as deafness. Medical care is necessary to treat hearing loss properly and to prevent the condition from getting worse. Your hearing may partially or completely come back, depending on what caused your hearing loss and how severe it is. In some cases, hearing loss is permanent. CAUSES Common causes of hearing loss include:   Too much wax in the ear canal.   Infection of the ear canal or middle ear.   Fluid in the middle ear.   Injury to the ear or surrounding area.   An object stuck in the ear.   Prolonged exposure to loud sounds, such as music.  Less common causes of hearing loss include:   Tumors in the ear.   Viral or bacterial infections, such as meningitis.   A hole in the eardrum (perforated eardrum).  Problems with the hearing nerve that sends signals between the brain and the ear.  Certain medicines.  SYMPTOMS  Symptoms of this condition may include:  Difficulty telling the difference between sounds.  Difficulty following a conversation when there is background noise.  Lack of response to sounds in your environment. This may be most noticeable when you do not respond to startling sounds.  Needing to turn up the volume  on the television, radio, etc.  Ringing in the ears.  Dizziness.  Pain in the ears. DIAGNOSIS This condition is diagnosed based on a physical exam and a hearing test (audiometry). The audiometry test will be performed by a hearing specialist (audiologist). You may also be referred to an ear, nose, and throat (ENT) specialist (otolaryngologist).  TREATMENT Treatment for recent onset of hearing loss may include:   Ear wax removal.   Being prescribed medicines to prevent infection (antibiotics).   Being prescribed medicines to reduce inflammation (corticosteroids).  HOME CARE INSTRUCTIONS  If you were prescribed an antibiotic medicine, take it as told by your health care provider. Do not stop taking the antibiotic even if you start to feel better.  Take over-the-counter and prescription medicines only as told by your health care provider.  Avoid loud noises.   Return to your normal activities as told by your health care provider. Ask your health care provider what activities are safe for you.  Keep all follow-up visits as told by your health care provider. This is important. SEEK MEDICAL CARE IF:   You feel dizzy.   You develop new symptoms.   You vomit or feel nauseous.   You have a fever.  SEEK IMMEDIATE MEDICAL CARE IF:  You develop sudden changes in your vision.   You have severe ear pain.   You have new or increased weakness.  You  have a severe headache.   This information is not intended to replace advice given to you by your health care provider. Make sure you discuss any questions you have with your health care provider.   Document Released: 02/16/2005 Document Revised: 11/07/2014 Document Reviewed: 07/04/2014 Elsevier Interactive Patient Education [employer].

## 2015-08-08 NOTE — Patient Instructions (Signed)
Continue her current medications.  She has atrophic vaginitis. Use the cream as prescribed.  We will call with her lab results.  No need to increase her blood pressure medication as her blood pressure is at goal (<150/90).  Follow up in 6 months.  Take care  Dr. Lacinda Axon   Atrophic Vaginitis Atrophic vaginitis is a condition in which the tissues that line the vagina become dry and thin. This condition is most common in women who have stopped having regular menstrual periods (menopause). This usually starts when a woman is 1-32 years old. Estrogen helps to keep the vagina moist. It stimulates the vagina to produce a clear fluid that lubricates the vagina for sexual intercourse. This fluid also protects the vagina from infection. Lack of estrogen can cause the lining of the vagina to get thinner and dryer. The vagina may also shrink in size. It may become less elastic. Atrophic vaginitis tends to get worse over time as a woman's estrogen level drops. CAUSES This condition is caused by the normal drop in estrogen that happens around the time of menopause. RISK FACTORS Certain conditions or situations may lower a woman's estrogen level, which increases her risk of atrophic vaginitis. These include:  Taking medicine that blocks estrogen.  Having ovaries removed surgically.  Being treated for cancer with X-ray treatment (radiation) or medicines (chemotherapy).  Exercising very hard and often.  Having an eating disorder (anorexia).  Giving birth or breastfeeding.  Being over the age of 24.  Smoking. SYMPTOMS Symptoms of this condition include:  Pain, soreness, or bleeding during sexual intercourse (dyspareunia).  Vaginal burning, irritation, or itching.  Pain or bleeding during a vaginal examination using a speculum (pelvic exam).  Loss of interest in sexual activity.  Having burning pain when passing urine.  Vaginal discharge that is brown or yellow. In some cases, there are  no symptoms. DIAGNOSIS This condition is diagnosed with a medical history and physical exam. This will include a pelvic exam that checks whether the inside of your vagina appears pale, thin, or dry. Rarely, you may also have other tests, including:  A urine test.  A test that checks the acid balance in your vaginal fluid (acid balance test). TREATMENT Treatment for this condition may depend on the severity of your symptoms. Treatment may include:  Using an over-the-counter vaginal lubricant before you have sexual intercourse.  Using a long-acting vaginal moisturizer.  Using low-dose vaginal estrogen for moderate to severe symptoms that do not respond to other treatments. Options include creams, tablets, and inserts (vaginal rings). Before using vaginal estrogen, tell your health care provider if you have a history of:  Breast cancer.  Endometrial cancer.  Blood clots.  Taking medicines. You may be able to take a daily pill for dyspareunia. Discuss all of the risks of this medicine with your health care provider. It is usually not recommended for women who have a family history or personal history of breast cancer. If your symptoms are very mild and you are not sexually active, you may not need treatment. HOME CARE INSTRUCTIONS  Take medicines only as directed by your health care provider. Do not use herbal or alternative medicines unless your health care provider says that you can.  Use over-the-counter creams, lubricants, or moisturizers for dryness only as directed by your health care provider.  If your atrophic vaginitis is caused by menopause, discuss all of your menopausal symptoms and treatment options with your health care provider.  Do not douche.  Do not  use products that can make your vagina dry. These include:  Scented feminine sprays.  Scented tampons.  Scented soaps.  If it hurts to have sex, talk with your sexual partner. SEEK MEDICAL CARE IF:  Your  discharge looks different than normal.  Your vagina has an unusual smell.  You have new symptoms.  Your symptoms do not improve with treatment.  Your symptoms get worse.   This information is not intended to replace advice given to you by your health care provider. Make sure you discuss any questions you have with your health care provider.   Document Released: 07/03/2014 Document Reviewed: 07/03/2014 Elsevier Interactive Patient Education [employer].

## 2015-08-08 NOTE — Assessment & Plan Note (Signed)
Stable. Lipid panel today. Given stability, age, and statin intolerance will not pursue additional treatment. Discussed this in depth with patient's son today. Continue Zetia.

## 2015-08-08 NOTE — Progress Notes (Addendum)
Subjective:   Katherine Olson is a 82 y.o. female who presents for an Initial Medicare Annual Wellness Visit.  Review of Systems    No ROS.  Medicare Wellness Visit.  Cardiac Risk Factors include: advanced age (>38men, >37 women);hypertension     Objective:    Today's Vitals   08/08/15 0910  BP: 148/72  Pulse: 63  Temp: 98.1 F (36.7 C)  TempSrc: Oral  Resp: 14  Height: 5\' 6"  (1.676 m)  Weight: 169 lb (76.658 kg)  SpO2: 95%   Body mass index is 27.29 kg/(m^2).   Current Medications (verified) Outpatient Encounter Prescriptions as of 08/08/2015  Medication Sig  . aspirin 81 MG tablet Take 81 mg by mouth daily.  . citalopram (CELEXA) 20 MG tablet TAKE ONE TABLET DAILY FOR DEPRESSION  . Coenzyme Q10 (CO Q-10) 200 MG CAPS Take 1 tablet by mouth daily.   Marland Kitchen ezetimibe (ZETIA) 10 MG tablet TAKE 1 TABLET BY MOUTH EACH DAY  . LORazepam (ATIVAN) 0.5 MG tablet Take 1 tablet (0.5 mg total) by mouth at bedtime.  Marland Kitchen losartan-hydrochlorothiazide (HYZAAR) 50-12.5 MG tablet Take 1 tablet by mouth daily.  . Multiple Vitamins-Minerals (MULTIVITAMIN WITH MINERALS) tablet Take 1 tablet by mouth daily.  . Omega 3 1200 MG CAPS Take 1,200 mg by mouth 2 (two) times daily.   . [DISCONTINUED] sulfamethoxazole-trimethoprim (BACTRIM DS,SEPTRA DS) 800-160 MG tablet Take 1 tablet by mouth 2 (two) times daily.   No facility-administered encounter medications on file as of 08/08/2015.    Allergies (verified) Zocor   History: Past Medical History  Diagnosis Date  . Hyperlipidemia   . Hypertension   . Depression    Past Surgical History  Procedure Laterality Date  . Joint replacement Right 1991    hip  . Abdominal hysterectomy      partial   Family History  Problem Relation Age of Onset  . Alzheimer's disease Brother   . Heart disease Sister   . Cancer Sister     kidney  . Cancer Mother     liver  . Cancer Father     prostate   Social History   Occupational History  .     housewife   Social History Main Topics  . Smoking status: Never Smoker   . Smokeless tobacco: Not on file  . Alcohol Use: No  . Drug Use: No  . Sexual Activity: Not Currently    Birth Control/ Protection: Surgical    Tobacco Counseling Counseling given: Not Answered   Activities of Daily Living In your present state of health, do you have any difficulty performing the following activities: 08/08/2015  Hearing? N  Vision? N  Difficulty concentrating or making decisions? Y  Walking or climbing stairs? N  Dressing or bathing? N  Doing errands, shopping? N  Preparing Food and eating ? N  Using the Toilet? N  In the past six months, have you accidently leaked urine? Y  Do you have problems with loss of bowel control? N  Managing your Medications? Y  Managing your Finances? Y  Housekeeping or managing your Housekeeping? Y    Immunizations and Health Maintenance Immunization History  Administered Date(s) Administered  . Influenza,inj,Quad PF,36+ Mos 12/18/2013  . Pneumococcal Conjugate-13 12/18/2013  . Pneumococcal-Unspecified 04/23/2009  . Td 05/16/2014   There are no preventive care reminders to display for this patient.  Patient Care Team: Coral Spikes, DO as PCP - General (Family Medicine)  Indicate any recent Medical Services you  may have received from other than [facility] providers in the past year (date may be approximate).     Assessment:   This is a routine wellness examination for Katherine Olson.  The goal of the wellness visit is to assist the patient how to close the gaps in care and create a preventative care plan for the patient.   Osteoporosis reviewed.   Medications reviewed; taking without issues or barriers.  Safety issues reviewed; smoke and carbon monoxide detectors in the home. No firearms in the home. Wears seatbelts when driving or riding with others. No violence in the home.  No identified risk were noted; The patient was oriented x 3; appropriate in  dress and manner and no objective failures at ADL's or IADL's.   Health maintenance gaps; closed.  Patient Concerns:  Urinary accidents, mostly at night and within the last 6 months.  Follow up appointment offered, but deferred at this time.  Wears brief daily.  Encouraged to drink larger amounts of fluid earlier in the day.  Follow up with PCP if condition worsens.  Hearing/Vision screen Hearing Screening Comments: Passed the whisper test.  Some complaints of difficulty hearing in crowds.  Audiology deferred. Educational material provided. Vision Screening Comments: Followed by [facility] Single cataract extracted Wears glasses daily Last OV 10/2014  Dietary issues and exercise activities discussed: Current Exercise Habits: Home exercise routine (Yard work), Time (Minutes): 15, Frequency (Times/Week): 2, Weekly Exercise (Minutes/Week): 30, Intensity: Mild  Goals    . Healthy Lifestyle     Stay hydrated and drink plenty of water. Drink larger amounts earlier in the day to help avoid night time accidents. Low carb foods.  Vegetables and lean meats. Stay active and continue doing yard work.  Start walking a half lap on the track 3 times a week.  Increase as tolerated.      Depression Screen PHQ 2/9 Scores 08/08/2015 04/06/2014 12/18/2013 11/02/2013  PHQ - 2 Score 0 0 0 0    Fall Risk Fall Risk  08/08/2015 04/06/2014 12/18/2013 11/02/2013  Falls in the past year? No No No No  Risk for fall due to : - - - Medication side effect    Cognitive Function: MMSE - Mini Mental State Exam 08/08/2015 04/06/2014 12/18/2013  Not completed: Refused - -  Orientation to time - 2 4  Orientation to Place - 2 4  Registration - 3 3  Attention/ Calculation - 5 5  Recall - 0 0  Language- name 2 objects - 2 2  Language- repeat - 1 1  Language- follow 3 step command - 3 3  Language- read & follow direction - 1 1  Write a sentence - 1 1  Copy design - 1 1  Total score - 21 25    Screening  Tests Health Maintenance  Topic Date Due  . INFLUENZA VACCINE  10/01/2015  . TETANUS/TDAP  05/15/2024  . DEXA SCAN  Completed  . ZOSTAVAX  Addressed  . PNA vac Low Risk Adult  Completed      Plan:   End of life planning; Advance aging; Advanced directives discussed. HCPOA/Living Will on file.  During the course of the visit, Valmai was educated and counseled about the following appropriate screening and preventive services:   Vaccines to include Pneumoccal, Influenza, Hepatitis B, Td, Zostavax, HCV  Electrocardiogram  Cardiovascular disease screening  Colorectal cancer screening  Bone density screening  Diabetes screening  Glaucoma screening  Mammography/PAP  Nutrition counseling  Smoking cessation counseling  Patient Instructions (the written plan) were given to the patient.    Varney Biles, LPN   [id number]    Care was provided under my supervision. I agree with the note.  Thersa Salt DO

## 2015-08-08 NOTE — Assessment & Plan Note (Signed)
New problem. This is the likely source of her issues with dysuria. Vaginal atrophy has been commented on by GYN. Treating with Premarin.

## 2015-08-08 NOTE — Progress Notes (Signed)
   Subjective:  Patient ID: Katherine Olson, female    DOB: 02/15/1927  Age: 82 y.o. MRN: [id number]  Patient seen by me as well. See separate encounter.   Review of Systems  Physical Exam  [place]

## 2015-08-11 ENCOUNTER — Encounter: Payer: Self-pay | Admitting: Family Medicine

## 2015-08-12 ENCOUNTER — Telehealth: Payer: Self-pay | Admitting: Family Medicine

## 2015-08-12 NOTE — Telephone Encounter (Signed)
Pt son called returning your call.   Call dale @ [phone]. Thank you!

## 2015-08-12 NOTE — Telephone Encounter (Signed)
Son was called about labs.

## 2015-08-29 ENCOUNTER — Ambulatory Visit: Payer: Self-pay | Admitting: Obstetrics and Gynecology

## 2015-09-05 ENCOUNTER — Ambulatory Visit: Payer: Self-pay | Admitting: Obstetrics and Gynecology

## 2015-09-05 ENCOUNTER — Encounter: Payer: Self-pay | Admitting: Obstetrics and Gynecology

## 2015-09-05 ENCOUNTER — Ambulatory Visit (INDEPENDENT_AMBULATORY_CARE_PROVIDER_SITE_OTHER): Payer: Commercial Managed Care - HMO | Admitting: Obstetrics and Gynecology

## 2015-09-05 VITALS — BP 178/77 | HR 67 | Ht 67.0 in | Wt 170.1 lb

## 2015-09-05 DIAGNOSIS — R300 Dysuria: Secondary | ICD-10-CM | POA: Diagnosis not present

## 2015-09-05 DIAGNOSIS — N952 Postmenopausal atrophic vaginitis: Secondary | ICD-10-CM

## 2015-09-05 DIAGNOSIS — N907 Vulvar cyst: Secondary | ICD-10-CM

## 2015-09-05 LAB — POCT URINALYSIS DIPSTICK
Bilirubin, UA: NEGATIVE
Glucose, UA: NEGATIVE
KETONES UA: NEGATIVE
Nitrite, UA: NEGATIVE
PH UA: 6
PROTEIN UA: NEGATIVE
Spec Grav, UA: 1.01
Urobilinogen, UA: NEGATIVE

## 2015-09-05 NOTE — Patient Instructions (Signed)
1. Urinalysis and urine culture is obtained today to rule out bladder infection 2. Recommend Premarin cream intravaginal once or twice a week to help with vaginal atrophy and burning 3. Vulvar biopsy site is healed now. Multiple inclusion cysts do not require any treatment at this time 4. Return as needed if vulvar irritation develops

## 2015-09-05 NOTE — Progress Notes (Signed)
Chief complaint: 1. Vaginal burning 2. Vaginal atrophy 3. Vulvar cysts  Patient presents for follow-up after evaluation in early June 2017. Patient underwent I and D of vulvar cystic mass which was nonproductive of secretions. Patient was placed on Septra DS for prevention of infection. Evaluation on 08/06/2015 was notable for patient to have multiple vulvar inclusion cysts without evidence of Bartholin gland cyst. The patient was encouraged to complete her course of antibiotics and return in 2 weeks for follow-up. The patient was encouraged to complete her course of antibiotics and return in 2 weeks for follow-up.  Currently patient is complaining of some vulvar burning, worse with urination.  Past medical history, past surgical history, problem list, medications, and allergies are reviewed.  OBJECTIVE: BP 178/77 mmHg  Pulse 67  Ht 5\' 7"  (1.702 m)  Wt 170 lb 1.6 oz (77.157 kg)  BMI 26.64 kg/m2  PELVIC: External Genitalia: Multiple labia majora inclusion cysts bilaterally; incision and drainage site on the left labia majora is healed BUS: Normal Vagina: Atrophic changes; no significant discharge Cervix: Surgically absent Uterus: Surgically absent Adnexa: Normal RV: Normal external exam Bladder: Nontender  ASSESSMENT: 1. Multiple vulvar inclusion cysts, stable; biopsy site healed 2. Vaginal atrophy 3. Vulvar burning and burning with urination, cannot rule out UTI  PLAN: 1. Urinalysis and urine culture 2. Recommend Premarin cream 1/2 g intravaginal once or twice a week 3. Return as needed for worsening vulvar symptoms.  A total of 15 minutes were spent face-to-face with the patient during this encounter and over half of that time dealt with counseling and coordination of care.  Brayton Mars, MD  Note: This dictation was prepared with Dragon dictation along with smaller  phrase technology. Any transcriptional errors that result from this process are unintentional.

## 2015-09-06 LAB — URINE CULTURE: Organism ID, Bacteria: NO GROWTH

## 2015-11-06 ENCOUNTER — Encounter: Payer: Self-pay | Admitting: Family Medicine

## 2015-11-06 ENCOUNTER — Other Ambulatory Visit: Payer: Self-pay | Admitting: Family Medicine

## 2015-11-06 MED ORDER — CITALOPRAM HYDROBROMIDE 10 MG PO TABS: 10.0000 mg | ORAL_TABLET | Freq: Every day | ORAL | 1 refills | Status: DC

## 2015-11-10 ENCOUNTER — Encounter: Payer: Self-pay | Admitting: Family Medicine

## 2015-11-20 ENCOUNTER — Encounter: Payer: Self-pay | Admitting: Family Medicine

## 2015-12-04 ENCOUNTER — Other Ambulatory Visit: Payer: Self-pay | Admitting: Family Medicine

## 2015-12-04 ENCOUNTER — Encounter: Payer: Self-pay | Admitting: Family Medicine

## 2015-12-04 ENCOUNTER — Other Ambulatory Visit: Payer: Self-pay | Admitting: Nurse Practitioner

## 2015-12-04 MED ORDER — CITALOPRAM HYDROBROMIDE 20 MG PO TABS: ORAL_TABLET | ORAL | 3 refills | Status: DC

## 2015-12-04 NOTE — Addendum Note (Signed)
Addended by: Carmin Muskrat on: 12/04/2015 03:07 PM   Modules accepted: Orders

## 2015-12-17 ENCOUNTER — Encounter: Payer: Self-pay | Admitting: Family Medicine

## 2015-12-30 ENCOUNTER — Encounter: Payer: Self-pay | Admitting: Family Medicine

## 2015-12-30 ENCOUNTER — Telehealth: Payer: Self-pay | Admitting: Family Medicine

## 2015-12-30 ENCOUNTER — Other Ambulatory Visit: Payer: Self-pay | Admitting: Family Medicine

## 2015-12-30 NOTE — Telephone Encounter (Signed)
Pt son called wanting to get labs done before physical appt.    Call son @ [phone]. Thank you!

## 2015-12-30 NOTE — Telephone Encounter (Signed)
appt is 02/10/16. Okay to schedule?

## 2016-01-01 ENCOUNTER — Other Ambulatory Visit: Payer: Self-pay | Admitting: Family Medicine

## 2016-01-01 DIAGNOSIS — R739 Hyperglycemia, unspecified: Secondary | ICD-10-CM

## 2016-01-01 DIAGNOSIS — I10 Essential (primary) hypertension: Secondary | ICD-10-CM

## 2016-01-01 DIAGNOSIS — E785 Hyperlipidemia, unspecified: Secondary | ICD-10-CM

## 2016-01-01 NOTE — Telephone Encounter (Signed)
Orders in 

## 2016-01-10 ENCOUNTER — Encounter: Payer: Self-pay | Admitting: Family Medicine

## 2016-01-14 ENCOUNTER — Other Ambulatory Visit: Payer: Self-pay | Admitting: Nurse Practitioner

## 2016-01-20 ENCOUNTER — Encounter: Payer: Self-pay | Admitting: Family Medicine

## 2016-02-10 ENCOUNTER — Encounter: Payer: Self-pay | Admitting: Family Medicine

## 2016-03-04 ENCOUNTER — Other Ambulatory Visit: Payer: Self-pay | Admitting: Internal Medicine

## 2016-03-04 NOTE — Telephone Encounter (Signed)
Ativan 05 mg   Last OV 68/2017 Last Refilled 02/01/2015 Dr. Derrel Nip Next OV 08/07/2016 Please advise

## 2016-03-05 NOTE — Telephone Encounter (Signed)
LMOM about Rx being faxed to pharmacy

## 2016-03-05 NOTE — Telephone Encounter (Signed)
faxed

## 2016-03-19 ENCOUNTER — Other Ambulatory Visit: Payer: Self-pay | Admitting: Family Medicine

## 2016-03-20 ENCOUNTER — Encounter: Payer: Self-pay | Admitting: Family Medicine

## 2016-06-09 DIAGNOSIS — R112 Nausea with vomiting, unspecified: Secondary | ICD-10-CM | POA: Diagnosis not present

## 2016-06-09 DIAGNOSIS — K838 Other specified diseases of biliary tract: Secondary | ICD-10-CM | POA: Diagnosis not present

## 2016-06-09 DIAGNOSIS — R590 Localized enlarged lymph nodes: Secondary | ICD-10-CM | POA: Diagnosis not present

## 2016-06-09 DIAGNOSIS — R0602 Shortness of breath: Secondary | ICD-10-CM | POA: Diagnosis not present

## 2016-06-09 DIAGNOSIS — R918 Other nonspecific abnormal finding of lung field: Secondary | ICD-10-CM | POA: Diagnosis not present

## 2016-06-09 DIAGNOSIS — R109 Unspecified abdominal pain: Secondary | ICD-10-CM | POA: Diagnosis not present

## 2016-06-09 DIAGNOSIS — R079 Chest pain, unspecified: Secondary | ICD-10-CM | POA: Diagnosis not present

## 2016-06-09 DIAGNOSIS — R1013 Epigastric pain: Secondary | ICD-10-CM | POA: Diagnosis not present

## 2016-06-09 DIAGNOSIS — N2889 Other specified disorders of kidney and ureter: Secondary | ICD-10-CM | POA: Diagnosis not present

## 2016-06-09 DIAGNOSIS — R0789 Other chest pain: Secondary | ICD-10-CM | POA: Diagnosis not present

## 2016-06-11 ENCOUNTER — Telehealth: Payer: Self-pay | Admitting: Family Medicine

## 2016-06-11 ENCOUNTER — Other Ambulatory Visit: Payer: Self-pay | Admitting: Family Medicine

## 2016-06-11 DIAGNOSIS — M545 Low back pain: Secondary | ICD-10-CM | POA: Diagnosis not present

## 2016-06-11 DIAGNOSIS — I10 Essential (primary) hypertension: Secondary | ICD-10-CM | POA: Diagnosis not present

## 2016-06-11 DIAGNOSIS — K8020 Calculus of gallbladder without cholecystitis without obstruction: Secondary | ICD-10-CM | POA: Diagnosis not present

## 2016-06-11 DIAGNOSIS — K819 Cholecystitis, unspecified: Secondary | ICD-10-CM | POA: Diagnosis not present

## 2016-06-11 DIAGNOSIS — N281 Cyst of kidney, acquired: Secondary | ICD-10-CM | POA: Diagnosis not present

## 2016-06-11 DIAGNOSIS — R9431 Abnormal electrocardiogram [ECG] [EKG]: Secondary | ICD-10-CM | POA: Diagnosis not present

## 2016-06-11 DIAGNOSIS — F0280 Dementia in other diseases classified elsewhere without behavioral disturbance: Secondary | ICD-10-CM | POA: Diagnosis not present

## 2016-06-11 DIAGNOSIS — N2889 Other specified disorders of kidney and ureter: Secondary | ICD-10-CM | POA: Diagnosis not present

## 2016-06-11 DIAGNOSIS — K828 Other specified diseases of gallbladder: Secondary | ICD-10-CM | POA: Diagnosis not present

## 2016-06-11 DIAGNOSIS — R1011 Right upper quadrant pain: Secondary | ICD-10-CM | POA: Diagnosis not present

## 2016-06-11 DIAGNOSIS — G309 Alzheimer's disease, unspecified: Secondary | ICD-10-CM | POA: Diagnosis not present

## 2016-06-11 DIAGNOSIS — K831 Obstruction of bile duct: Secondary | ICD-10-CM | POA: Diagnosis not present

## 2016-06-11 DIAGNOSIS — R188 Other ascites: Secondary | ICD-10-CM | POA: Diagnosis not present

## 2016-06-11 DIAGNOSIS — K838 Other specified diseases of biliary tract: Secondary | ICD-10-CM | POA: Diagnosis not present

## 2016-06-11 NOTE — Telephone Encounter (Signed)
Patient's son called the office.  She is still in pain, and he is concerned as he received a call from the [facility] ER with an addendum report that she has pancreatic duct swelling and a right kidney mass that will need further evaluation, they suggested a MRI/MRCP.  Son is very upset.  While he was on hold, call received from [facility] ER clinical nurse.  She wanted to give a heads up that when she called results of addendum to patients son he was very concerning and believes this is all urgent now and wants things down right now.  He was told by her that she would need to follow up with the PCP to decide what further action needed to be taken.  He again was not happy with that.  He was advised by our office that if his mother was in distress and not getting any pain relief to take her to the emergency room again as we don't have the resources in the office to assist in her pain management.  He again was not happy with our response of taking her back to the ED, he wanted to know what they can do for her and it was explained that it will depend on what her symptoms are and what that provider in the ED requests.  His response was that I guess no one can help [place].  The appt slot for tomorrow remains on hold. I have reports from [facility], placed on your desk for review. thanks

## 2016-06-11 NOTE — Telephone Encounter (Signed)
I spoke with the son. He seemed upset that I had not yet called (I have been seeing patients). He is very concerned and wants to know the cause of her problem. He stated that I "would not order her tests". I informed him that this is not the case. I recommended that she go to the emergency department given her severe pain. Additionally, she can also get her MRCP done much faster than I can perform it in the outpatient setting. I encouraged him to focus on getting his mother some pain relief and to have her further evaluated given worsening symptoms. Patient acknowledged and hung up rather quickly.

## 2016-06-11 NOTE — Telephone Encounter (Signed)
Would recommend repeat eval in the ER.

## 2016-06-11 NOTE — Telephone Encounter (Signed)
Pt's son who is POA would like to talk to Dr. Lacinda Axon about some of the issues his mother is having. Please call [phone].

## 2016-06-11 NOTE — Telephone Encounter (Signed)
pts son would call if pt was up to coming in on Friday at 4 p.m.

## 2016-06-11 NOTE — Telephone Encounter (Signed)
Pt son [name] was called back. He stated that the pt experienced Stomach pain at the navel and to the right.  She had some vomiting that night and son recommended the ER. They were seen at the [facility] ER on Tuesday. They did blood work and a CT with contrast which was normal. She did have elevated serum calcium. Pt was given morphine in the ER which caused her to sleep all day Wednesday with no complaints of abdominal pain. Pt is stating to her son that her leg muscle feel tight and walking is hard for her. Son is concerned because abdominal pain started again early this morning and only lasted for 45 min she did not experience N/V this morning. [facility] did ask the son if she has had a history of gallbladder problems. Son was offered an appt for tomorrow at 4 p.m. He would callback ifmother is not better the slot was placed on hold until he calls back.  Please advise son would like to know your thoughts on the issues shes having.

## 2016-06-12 DIAGNOSIS — E785 Hyperlipidemia, unspecified: Secondary | ICD-10-CM | POA: Diagnosis not present

## 2016-06-12 DIAGNOSIS — K819 Cholecystitis, unspecified: Secondary | ICD-10-CM | POA: Diagnosis not present

## 2016-06-12 DIAGNOSIS — K838 Other specified diseases of biliary tract: Secondary | ICD-10-CM | POA: Diagnosis not present

## 2016-06-12 DIAGNOSIS — M545 Low back pain: Secondary | ICD-10-CM | POA: Diagnosis not present

## 2016-06-12 DIAGNOSIS — R1011 Right upper quadrant pain: Secondary | ICD-10-CM | POA: Diagnosis not present

## 2016-06-12 DIAGNOSIS — K810 Acute cholecystitis: Secondary | ICD-10-CM | POA: Diagnosis not present

## 2016-06-12 DIAGNOSIS — K269 Duodenal ulcer, unspecified as acute or chronic, without hemorrhage or perforation: Secondary | ICD-10-CM | POA: Diagnosis not present

## 2016-06-12 DIAGNOSIS — K8010 Calculus of gallbladder with chronic cholecystitis without obstruction: Secondary | ICD-10-CM | POA: Diagnosis not present

## 2016-06-12 DIAGNOSIS — K8689 Other specified diseases of pancreas: Secondary | ICD-10-CM | POA: Diagnosis not present

## 2016-06-12 DIAGNOSIS — K811 Chronic cholecystitis: Secondary | ICD-10-CM | POA: Diagnosis not present

## 2016-06-12 DIAGNOSIS — G309 Alzheimer's disease, unspecified: Secondary | ICD-10-CM | POA: Diagnosis not present

## 2016-06-12 DIAGNOSIS — N2889 Other specified disorders of kidney and ureter: Secondary | ICD-10-CM | POA: Diagnosis not present

## 2016-06-12 DIAGNOSIS — F329 Major depressive disorder, single episode, unspecified: Secondary | ICD-10-CM | POA: Diagnosis not present

## 2016-06-12 DIAGNOSIS — F0280 Dementia in other diseases classified elsewhere without behavioral disturbance: Secondary | ICD-10-CM | POA: Diagnosis not present

## 2016-06-12 DIAGNOSIS — Z01810 Encounter for preprocedural cardiovascular examination: Secondary | ICD-10-CM | POA: Diagnosis not present

## 2016-06-12 DIAGNOSIS — I10 Essential (primary) hypertension: Secondary | ICD-10-CM | POA: Diagnosis not present

## 2016-06-12 DIAGNOSIS — R932 Abnormal findings on diagnostic imaging of liver and biliary tract: Secondary | ICD-10-CM | POA: Diagnosis not present

## 2016-06-12 DIAGNOSIS — K831 Obstruction of bile duct: Secondary | ICD-10-CM | POA: Diagnosis not present

## 2016-06-12 DIAGNOSIS — N281 Cyst of kidney, acquired: Secondary | ICD-10-CM | POA: Diagnosis not present

## 2016-06-12 DIAGNOSIS — K828 Other specified diseases of gallbladder: Secondary | ICD-10-CM | POA: Diagnosis not present

## 2016-06-17 ENCOUNTER — Telehealth: Payer: Self-pay | Admitting: Family Medicine

## 2016-06-17 NOTE — Telephone Encounter (Signed)
Patients son has requested a update on this form Contact [phone]

## 2016-06-17 NOTE — Telephone Encounter (Signed)
Pt son called about needing a FL2 so pt can have some assistance come in the home stating she has mild dementia. Please advise?  Call son @ [phone]. Thank you!

## 2016-06-17 NOTE — Telephone Encounter (Signed)
Please let pt son know that we have FL 2 due to [place] seeing pt we have not completed. We have up to 5 days to complete form. Would he like [place] to call when we complete for him to pick it up from our office?

## 2016-06-18 NOTE — Telephone Encounter (Signed)
Please call son when forms are completed  Contact [phone]

## 2016-06-20 DIAGNOSIS — F0281 Dementia in other diseases classified elsewhere with behavioral disturbance: Secondary | ICD-10-CM | POA: Diagnosis not present

## 2016-06-20 DIAGNOSIS — K8010 Calculus of gallbladder with chronic cholecystitis without obstruction: Secondary | ICD-10-CM | POA: Diagnosis not present

## 2016-06-20 DIAGNOSIS — K269 Duodenal ulcer, unspecified as acute or chronic, without hemorrhage or perforation: Secondary | ICD-10-CM | POA: Diagnosis not present

## 2016-06-20 DIAGNOSIS — G309 Alzheimer's disease, unspecified: Secondary | ICD-10-CM | POA: Diagnosis not present

## 2016-06-20 DIAGNOSIS — Z7982 Long term (current) use of aspirin: Secondary | ICD-10-CM | POA: Diagnosis not present

## 2016-06-20 DIAGNOSIS — I10 Essential (primary) hypertension: Secondary | ICD-10-CM | POA: Diagnosis not present

## 2016-06-20 DIAGNOSIS — N2889 Other specified disorders of kidney and ureter: Secondary | ICD-10-CM | POA: Diagnosis not present

## 2016-06-20 DIAGNOSIS — K5900 Constipation, unspecified: Secondary | ICD-10-CM | POA: Diagnosis not present

## 2016-06-20 DIAGNOSIS — F329 Major depressive disorder, single episode, unspecified: Secondary | ICD-10-CM | POA: Diagnosis not present

## 2016-06-22 ENCOUNTER — Other Ambulatory Visit: Payer: Self-pay

## 2016-06-22 DIAGNOSIS — F329 Major depressive disorder, single episode, unspecified: Secondary | ICD-10-CM | POA: Diagnosis not present

## 2016-06-22 DIAGNOSIS — K5900 Constipation, unspecified: Secondary | ICD-10-CM | POA: Diagnosis not present

## 2016-06-22 DIAGNOSIS — K8010 Calculus of gallbladder with chronic cholecystitis without obstruction: Secondary | ICD-10-CM | POA: Diagnosis not present

## 2016-06-22 DIAGNOSIS — N2889 Other specified disorders of kidney and ureter: Secondary | ICD-10-CM | POA: Diagnosis not present

## 2016-06-22 DIAGNOSIS — K269 Duodenal ulcer, unspecified as acute or chronic, without hemorrhage or perforation: Secondary | ICD-10-CM | POA: Diagnosis not present

## 2016-06-22 DIAGNOSIS — G309 Alzheimer's disease, unspecified: Secondary | ICD-10-CM | POA: Diagnosis not present

## 2016-06-22 DIAGNOSIS — F0281 Dementia in other diseases classified elsewhere with behavioral disturbance: Secondary | ICD-10-CM | POA: Diagnosis not present

## 2016-06-22 DIAGNOSIS — I10 Essential (primary) hypertension: Secondary | ICD-10-CM | POA: Diagnosis not present

## 2016-06-22 DIAGNOSIS — Z7982 Long term (current) use of aspirin: Secondary | ICD-10-CM | POA: Diagnosis not present

## 2016-06-22 NOTE — Patient Outreach (Signed)
[facility]) Care Management  06/22/2016  Katherine Olson January 28, 1927 [id number]     Transition of Care Referral  Referral Date: 06/22/16 Referral Source: [employer] Discharge Report Date of Admission: 06/11/16 Diagnosis: Cholecystitis Date of Discharge: 06/17/16 Facility: [facility] attempt # 1 to patient. No answer at primary number and secondary number. RN CM left HIPAA compliant voicemail message along with contact info.    Plan: RN CM will make outreach attempt to patient within one business day if no return call from patient.   Enzo Montgomery, RN,BSN,CCM [facility] Management Telephonic Care Management Coordinator Direct Phone: [phone] Toll Free: [phone] Fax: [phone]        Enzo Montgomery, RN,BSN,CCM [facility] Management Telephonic Care Management Coordinator Direct Phone: [phone] Toll Free: [phone] Fax: [phone]

## 2016-06-23 ENCOUNTER — Ambulatory Visit: Payer: Self-pay

## 2016-06-24 ENCOUNTER — Telehealth: Payer: Self-pay | Admitting: Family Medicine

## 2016-06-24 ENCOUNTER — Other Ambulatory Visit: Payer: Self-pay

## 2016-06-24 DIAGNOSIS — F329 Major depressive disorder, single episode, unspecified: Secondary | ICD-10-CM | POA: Diagnosis not present

## 2016-06-24 DIAGNOSIS — K8010 Calculus of gallbladder with chronic cholecystitis without obstruction: Secondary | ICD-10-CM | POA: Diagnosis not present

## 2016-06-24 DIAGNOSIS — F0281 Dementia in other diseases classified elsewhere with behavioral disturbance: Secondary | ICD-10-CM | POA: Diagnosis not present

## 2016-06-24 DIAGNOSIS — G309 Alzheimer's disease, unspecified: Secondary | ICD-10-CM | POA: Diagnosis not present

## 2016-06-24 DIAGNOSIS — Z7982 Long term (current) use of aspirin: Secondary | ICD-10-CM | POA: Diagnosis not present

## 2016-06-24 DIAGNOSIS — N2889 Other specified disorders of kidney and ureter: Secondary | ICD-10-CM | POA: Diagnosis not present

## 2016-06-24 DIAGNOSIS — I10 Essential (primary) hypertension: Secondary | ICD-10-CM | POA: Diagnosis not present

## 2016-06-24 DIAGNOSIS — K269 Duodenal ulcer, unspecified as acute or chronic, without hemorrhage or perforation: Secondary | ICD-10-CM | POA: Diagnosis not present

## 2016-06-24 DIAGNOSIS — K5900 Constipation, unspecified: Secondary | ICD-10-CM | POA: Diagnosis not present

## 2016-06-24 NOTE — Patient Outreach (Signed)
[place] [facility]) Care Management  06/24/2016  Katherine Olson 03-30-1926 [id number]   Transition of Care Referral  Referral Date: 06/22/16 Referral Source: [employer] Discharge Report Date of Admission: 06/11/16 Diagnosis: Cholecystitis Date of Discharge: 06/17/16 Facility: [place]: [facility]    Voicemail message received from son-Dale-stating he is patient's HCPOA and requested a return call on his cell. Outreach attempt #2 to patient/son. No answer at present. RN CM left HIPAA compliant voicemail message along with contact info.     Plan: RN CM will make outreach attempt to patient within one business day if no return call.   Enzo Montgomery, RN,BSN,CCM [place] Management Telephonic Care Management Coordinator Direct Phone: [phone] Toll Free: [phone] Fax: [phone]

## 2016-06-24 NOTE — Telephone Encounter (Signed)
Izora Gala from [facility] home care specialists called and stated that when pt was discharged from the hospital they had an order for a walker and comodo but they denied it. Pt is now asking if she could get a rx for a rollator and commode. Izora Gala states that we can put the orders in Epic and she can retrieve them. Please advise, thank you!  Call Adeline @ [phone] (may leave message)

## 2016-06-25 ENCOUNTER — Other Ambulatory Visit: Payer: Self-pay

## 2016-06-25 ENCOUNTER — Other Ambulatory Visit: Payer: Self-pay | Admitting: Family Medicine

## 2016-06-25 DIAGNOSIS — N2889 Other specified disorders of kidney and ureter: Secondary | ICD-10-CM | POA: Diagnosis not present

## 2016-06-25 DIAGNOSIS — F329 Major depressive disorder, single episode, unspecified: Secondary | ICD-10-CM | POA: Diagnosis not present

## 2016-06-25 DIAGNOSIS — G309 Alzheimer's disease, unspecified: Secondary | ICD-10-CM | POA: Diagnosis not present

## 2016-06-25 DIAGNOSIS — Z7982 Long term (current) use of aspirin: Secondary | ICD-10-CM | POA: Diagnosis not present

## 2016-06-25 DIAGNOSIS — I10 Essential (primary) hypertension: Secondary | ICD-10-CM | POA: Diagnosis not present

## 2016-06-25 DIAGNOSIS — K269 Duodenal ulcer, unspecified as acute or chronic, without hemorrhage or perforation: Secondary | ICD-10-CM | POA: Diagnosis not present

## 2016-06-25 DIAGNOSIS — F0280 Dementia in other diseases classified elsewhere without behavioral disturbance: Secondary | ICD-10-CM

## 2016-06-25 DIAGNOSIS — K5900 Constipation, unspecified: Secondary | ICD-10-CM | POA: Diagnosis not present

## 2016-06-25 DIAGNOSIS — K8010 Calculus of gallbladder with chronic cholecystitis without obstruction: Secondary | ICD-10-CM | POA: Diagnosis not present

## 2016-06-25 DIAGNOSIS — F0281 Dementia in other diseases classified elsewhere with behavioral disturbance: Secondary | ICD-10-CM | POA: Diagnosis not present

## 2016-06-25 NOTE — Telephone Encounter (Signed)
Called Katherine Olson to get a fax number so orders can be faxed to her office.

## 2016-06-25 NOTE — Patient Outreach (Signed)
[facility]) Care Management  06/25/2016  Katherine Olson Aug 05, 1926 [id number]   Transition of Care Referral  Referral Date: 06/22/16 Referral Source: [employer] Discharge Report Date of Admission: 06/11/16 Diagnosis: Cholecystitis Date of Discharge: 06/17/16 Facility: [place]: [facility]   Outreach attempt #3 to patient. Line busy and unable to leave message.    Plan: RN CM will send unsuccessful outreach letter to patient and close case if no response from patient within 10 business days.    Enzo Montgomery, RN,BSN,CCM [place] Management Telephonic Care Management Coordinator Direct Phone: [phone] Toll Free: [phone] Fax: [phone]

## 2016-06-25 NOTE — Telephone Encounter (Signed)
I ordered these items, but they printed.

## 2016-06-26 NOTE — Telephone Encounter (Signed)
Katherine Olson called back and paperwork faxed.

## 2016-06-29 ENCOUNTER — Telehealth: Payer: Self-pay | Admitting: *Deleted

## 2016-06-29 DIAGNOSIS — G309 Alzheimer's disease, unspecified: Secondary | ICD-10-CM | POA: Diagnosis not present

## 2016-06-29 DIAGNOSIS — E785 Hyperlipidemia, unspecified: Secondary | ICD-10-CM | POA: Diagnosis not present

## 2016-06-29 DIAGNOSIS — I10 Essential (primary) hypertension: Secondary | ICD-10-CM | POA: Diagnosis not present

## 2016-06-29 DIAGNOSIS — K810 Acute cholecystitis: Secondary | ICD-10-CM | POA: Diagnosis not present

## 2016-06-29 DIAGNOSIS — F0280 Dementia in other diseases classified elsewhere without behavioral disturbance: Secondary | ICD-10-CM | POA: Diagnosis not present

## 2016-06-29 MED ORDER — OMEPRAZOLE 20 MG PO CPDR: 40.0000 mg | DELAYED_RELEASE_CAPSULE | Freq: Two times a day (BID) | ORAL | 1 refills | Status: DC

## 2016-06-29 NOTE — Telephone Encounter (Signed)
FYI Patient son requested to have Joycelyn Schmid as her PCP. Pt will be switching from Dr. Lacinda Axon

## 2016-06-29 NOTE — Telephone Encounter (Signed)
Medication Refill requested for : omeprazole this Rx was written while pt was in the hospital at [facility]  Return Contact : Son-Dale  [phone]

## 2016-06-29 NOTE — Telephone Encounter (Signed)
Refill sent.

## 2016-06-29 NOTE — Telephone Encounter (Signed)
FYI

## 2016-06-30 DIAGNOSIS — Z7982 Long term (current) use of aspirin: Secondary | ICD-10-CM | POA: Diagnosis not present

## 2016-06-30 DIAGNOSIS — F329 Major depressive disorder, single episode, unspecified: Secondary | ICD-10-CM | POA: Diagnosis not present

## 2016-06-30 DIAGNOSIS — I10 Essential (primary) hypertension: Secondary | ICD-10-CM | POA: Diagnosis not present

## 2016-06-30 DIAGNOSIS — K8010 Calculus of gallbladder with chronic cholecystitis without obstruction: Secondary | ICD-10-CM | POA: Diagnosis not present

## 2016-06-30 DIAGNOSIS — N2889 Other specified disorders of kidney and ureter: Secondary | ICD-10-CM | POA: Diagnosis not present

## 2016-06-30 DIAGNOSIS — F0281 Dementia in other diseases classified elsewhere with behavioral disturbance: Secondary | ICD-10-CM | POA: Diagnosis not present

## 2016-06-30 DIAGNOSIS — K5900 Constipation, unspecified: Secondary | ICD-10-CM | POA: Diagnosis not present

## 2016-06-30 DIAGNOSIS — K269 Duodenal ulcer, unspecified as acute or chronic, without hemorrhage or perforation: Secondary | ICD-10-CM | POA: Diagnosis not present

## 2016-06-30 DIAGNOSIS — G309 Alzheimer's disease, unspecified: Secondary | ICD-10-CM | POA: Diagnosis not present

## 2016-07-02 ENCOUNTER — Encounter: Payer: Self-pay | Admitting: Family

## 2016-07-02 DIAGNOSIS — K269 Duodenal ulcer, unspecified as acute or chronic, without hemorrhage or perforation: Secondary | ICD-10-CM | POA: Diagnosis not present

## 2016-07-02 DIAGNOSIS — K8010 Calculus of gallbladder with chronic cholecystitis without obstruction: Secondary | ICD-10-CM | POA: Diagnosis not present

## 2016-07-02 DIAGNOSIS — N2889 Other specified disorders of kidney and ureter: Secondary | ICD-10-CM | POA: Diagnosis not present

## 2016-07-02 DIAGNOSIS — F0281 Dementia in other diseases classified elsewhere with behavioral disturbance: Secondary | ICD-10-CM | POA: Diagnosis not present

## 2016-07-02 DIAGNOSIS — I10 Essential (primary) hypertension: Secondary | ICD-10-CM | POA: Diagnosis not present

## 2016-07-02 DIAGNOSIS — G309 Alzheimer's disease, unspecified: Secondary | ICD-10-CM | POA: Diagnosis not present

## 2016-07-02 DIAGNOSIS — F329 Major depressive disorder, single episode, unspecified: Secondary | ICD-10-CM | POA: Diagnosis not present

## 2016-07-02 DIAGNOSIS — Z7982 Long term (current) use of aspirin: Secondary | ICD-10-CM | POA: Diagnosis not present

## 2016-07-02 DIAGNOSIS — K5900 Constipation, unspecified: Secondary | ICD-10-CM | POA: Diagnosis not present

## 2016-07-06 DIAGNOSIS — N281 Cyst of kidney, acquired: Secondary | ICD-10-CM | POA: Diagnosis not present

## 2016-07-06 DIAGNOSIS — J9811 Atelectasis: Secondary | ICD-10-CM | POA: Diagnosis not present

## 2016-07-06 DIAGNOSIS — R1010 Upper abdominal pain, unspecified: Secondary | ICD-10-CM | POA: Diagnosis not present

## 2016-07-06 DIAGNOSIS — N289 Disorder of kidney and ureter, unspecified: Secondary | ICD-10-CM | POA: Diagnosis not present

## 2016-07-06 DIAGNOSIS — K8689 Other specified diseases of pancreas: Secondary | ICD-10-CM | POA: Diagnosis not present

## 2016-07-06 DIAGNOSIS — C787 Secondary malignant neoplasm of liver and intrahepatic bile duct: Secondary | ICD-10-CM | POA: Diagnosis not present

## 2016-07-06 DIAGNOSIS — Z96641 Presence of right artificial hip joint: Secondary | ICD-10-CM | POA: Diagnosis not present

## 2016-07-08 DIAGNOSIS — K819 Cholecystitis, unspecified: Secondary | ICD-10-CM | POA: Diagnosis not present

## 2016-07-08 DIAGNOSIS — R1010 Upper abdominal pain, unspecified: Secondary | ICD-10-CM | POA: Diagnosis not present

## 2016-07-09 ENCOUNTER — Other Ambulatory Visit: Payer: Self-pay

## 2016-07-09 NOTE — Patient Outreach (Signed)
[facility]) Care Management  07/09/2016  Katherine Olson [birth year] [id number]   Transition of Care Referral  Referral Date: 06/22/16 Referral Source: [employer] Discharge Report Date of Admission: 06/11/16 Diagnosis: Cholecystitis Date of Discharge: 06/17/16 Facility: [place]    Multiple attempts to establish contact with patient. No response from letter mailed to patient.Case is being closed.       Plan: RN CM will notify [facility] administrative assistant of case closure.    Enzo Montgomery, RN,BSN,CCM [place] Management Telephonic Care Management Coordinator Direct Phone: [phone] Toll Free: [phone] Fax: [phone]

## 2016-07-17 ENCOUNTER — Other Ambulatory Visit: Payer: Self-pay | Admitting: Family Medicine

## 2016-07-22 DIAGNOSIS — N2889 Other specified disorders of kidney and ureter: Secondary | ICD-10-CM | POA: Diagnosis not present

## 2016-08-01 DIAGNOSIS — K311 Adult hypertrophic pyloric stenosis: Secondary | ICD-10-CM | POA: Diagnosis not present

## 2016-08-01 DIAGNOSIS — Z96641 Presence of right artificial hip joint: Secondary | ICD-10-CM | POA: Diagnosis not present

## 2016-08-01 DIAGNOSIS — T85858A Stenosis due to other internal prosthetic devices, implants and grafts, initial encounter: Secondary | ICD-10-CM | POA: Diagnosis not present

## 2016-08-01 DIAGNOSIS — F329 Major depressive disorder, single episode, unspecified: Secondary | ICD-10-CM | POA: Diagnosis not present

## 2016-08-01 DIAGNOSIS — R1011 Right upper quadrant pain: Secondary | ICD-10-CM | POA: Diagnosis not present

## 2016-08-01 DIAGNOSIS — G309 Alzheimer's disease, unspecified: Secondary | ICD-10-CM | POA: Diagnosis not present

## 2016-08-01 DIAGNOSIS — I10 Essential (primary) hypertension: Secondary | ICD-10-CM | POA: Diagnosis not present

## 2016-08-01 DIAGNOSIS — R1013 Epigastric pain: Secondary | ICD-10-CM | POA: Diagnosis not present

## 2016-08-01 DIAGNOSIS — Z4659 Encounter for fitting and adjustment of other gastrointestinal appliance and device: Secondary | ICD-10-CM | POA: Diagnosis not present

## 2016-08-01 DIAGNOSIS — K5730 Diverticulosis of large intestine without perforation or abscess without bleeding: Secondary | ICD-10-CM | POA: Diagnosis not present

## 2016-08-01 DIAGNOSIS — E785 Hyperlipidemia, unspecified: Secondary | ICD-10-CM | POA: Diagnosis not present

## 2016-08-01 DIAGNOSIS — R079 Chest pain, unspecified: Secondary | ICD-10-CM | POA: Diagnosis not present

## 2016-08-01 DIAGNOSIS — N281 Cyst of kidney, acquired: Secondary | ICD-10-CM | POA: Diagnosis not present

## 2016-08-01 DIAGNOSIS — R112 Nausea with vomiting, unspecified: Secondary | ICD-10-CM | POA: Diagnosis not present

## 2016-08-01 DIAGNOSIS — D1803 Hemangioma of intra-abdominal structures: Secondary | ICD-10-CM | POA: Diagnosis not present

## 2016-08-01 DIAGNOSIS — F0390 Unspecified dementia without behavioral disturbance: Secondary | ICD-10-CM | POA: Diagnosis not present

## 2016-08-01 DIAGNOSIS — K811 Chronic cholecystitis: Secondary | ICD-10-CM | POA: Diagnosis not present

## 2016-08-01 DIAGNOSIS — F0280 Dementia in other diseases classified elsewhere without behavioral disturbance: Secondary | ICD-10-CM | POA: Diagnosis not present

## 2016-08-01 DIAGNOSIS — Z09 Encounter for follow-up examination after completed treatment for conditions other than malignant neoplasm: Secondary | ICD-10-CM | POA: Diagnosis not present

## 2016-08-01 DIAGNOSIS — Z4682 Encounter for fitting and adjustment of non-vascular catheter: Secondary | ICD-10-CM | POA: Diagnosis not present

## 2016-08-01 DIAGNOSIS — Z978 Presence of other specified devices: Secondary | ICD-10-CM | POA: Diagnosis not present

## 2016-08-01 DIAGNOSIS — N2889 Other specified disorders of kidney and ureter: Secondary | ICD-10-CM | POA: Diagnosis not present

## 2016-08-01 DIAGNOSIS — R1110 Vomiting, unspecified: Secondary | ICD-10-CM | POA: Diagnosis not present

## 2016-08-02 DIAGNOSIS — R112 Nausea with vomiting, unspecified: Secondary | ICD-10-CM | POA: Insufficient documentation

## 2016-08-07 ENCOUNTER — Encounter: Payer: Self-pay | Admitting: Family Medicine

## 2016-08-07 ENCOUNTER — Ambulatory Visit: Payer: Self-pay

## 2016-08-07 ENCOUNTER — Ambulatory Visit: Payer: Self-pay | Admitting: Family Medicine

## 2016-10-16 ENCOUNTER — Other Ambulatory Visit: Payer: Self-pay | Admitting: Family Medicine

## 2016-10-28 DIAGNOSIS — K8050 Calculus of bile duct without cholangitis or cholecystitis without obstruction: Secondary | ICD-10-CM | POA: Diagnosis not present

## 2016-10-28 DIAGNOSIS — K838 Other specified diseases of biliary tract: Secondary | ICD-10-CM | POA: Diagnosis not present

## 2016-10-28 DIAGNOSIS — F329 Major depressive disorder, single episode, unspecified: Secondary | ICD-10-CM | POA: Diagnosis not present

## 2016-10-28 DIAGNOSIS — I10 Essential (primary) hypertension: Secondary | ICD-10-CM | POA: Diagnosis not present

## 2016-10-28 DIAGNOSIS — K9189 Other postprocedural complications and disorders of digestive system: Secondary | ICD-10-CM | POA: Diagnosis not present

## 2016-10-28 DIAGNOSIS — K8020 Calculus of gallbladder without cholecystitis without obstruction: Secondary | ICD-10-CM | POA: Diagnosis not present

## 2016-10-28 DIAGNOSIS — K851 Biliary acute pancreatitis without necrosis or infection: Secondary | ICD-10-CM | POA: Diagnosis not present

## 2016-10-28 DIAGNOSIS — K812 Acute cholecystitis with chronic cholecystitis: Secondary | ICD-10-CM | POA: Diagnosis not present

## 2016-10-28 DIAGNOSIS — R079 Chest pain, unspecified: Secondary | ICD-10-CM | POA: Diagnosis not present

## 2016-10-28 DIAGNOSIS — C641 Malignant neoplasm of right kidney, except renal pelvis: Secondary | ICD-10-CM | POA: Diagnosis not present

## 2016-10-28 DIAGNOSIS — I350 Nonrheumatic aortic (valve) stenosis: Secondary | ICD-10-CM | POA: Diagnosis not present

## 2016-10-28 DIAGNOSIS — N2889 Other specified disorders of kidney and ureter: Secondary | ICD-10-CM | POA: Diagnosis not present

## 2016-10-28 DIAGNOSIS — K8070 Calculus of gallbladder and bile duct without cholecystitis without obstruction: Secondary | ICD-10-CM | POA: Diagnosis not present

## 2016-10-28 DIAGNOSIS — R9431 Abnormal electrocardiogram [ECG] [EKG]: Secondary | ICD-10-CM | POA: Diagnosis not present

## 2016-10-28 DIAGNOSIS — R1011 Right upper quadrant pain: Secondary | ICD-10-CM | POA: Diagnosis not present

## 2016-10-28 DIAGNOSIS — R0789 Other chest pain: Secondary | ICD-10-CM | POA: Diagnosis not present

## 2016-10-28 DIAGNOSIS — N179 Acute kidney failure, unspecified: Secondary | ICD-10-CM | POA: Diagnosis not present

## 2016-10-28 DIAGNOSIS — E785 Hyperlipidemia, unspecified: Secondary | ICD-10-CM | POA: Diagnosis not present

## 2016-10-28 DIAGNOSIS — R112 Nausea with vomiting, unspecified: Secondary | ICD-10-CM | POA: Diagnosis not present

## 2016-10-28 DIAGNOSIS — K820 Obstruction of gallbladder: Secondary | ICD-10-CM | POA: Diagnosis not present

## 2016-10-28 DIAGNOSIS — G309 Alzheimer's disease, unspecified: Secondary | ICD-10-CM | POA: Diagnosis not present

## 2016-10-28 DIAGNOSIS — F0390 Unspecified dementia without behavioral disturbance: Secondary | ICD-10-CM | POA: Diagnosis not present

## 2016-10-28 DIAGNOSIS — F0280 Dementia in other diseases classified elsewhere without behavioral disturbance: Secondary | ICD-10-CM | POA: Diagnosis not present

## 2016-10-28 DIAGNOSIS — R109 Unspecified abdominal pain: Secondary | ICD-10-CM | POA: Diagnosis not present

## 2016-10-28 DIAGNOSIS — K828 Other specified diseases of gallbladder: Secondary | ICD-10-CM | POA: Diagnosis not present

## 2016-10-29 DIAGNOSIS — R1011 Right upper quadrant pain: Secondary | ICD-10-CM | POA: Diagnosis not present

## 2016-10-30 DIAGNOSIS — K8050 Calculus of bile duct without cholangitis or cholecystitis without obstruction: Secondary | ICD-10-CM | POA: Diagnosis not present

## 2016-11-05 ENCOUNTER — Other Ambulatory Visit: Payer: Self-pay | Admitting: *Deleted

## 2016-11-05 NOTE — Patient Outreach (Signed)
[place] [facility]) Care Management  11/05/2016  EMAGENE MERFELD 01/11/1927 [id number]   Transition of Care Referral  Referral Date: 11/03/16 Referral Source: [employer] Referral Date of Discharge: 10/31/16 Facility: [facility] Discharge Diagnosis:  Insurance: [facility]  Outreach attempt # 1 to patient. Spoke with patient's son [name]). [name] stated, he is the patient's HPOA. [facility] Designated Party Release Form list Emmamae Mcnamara as being authorized to patient's PHI. HIPAA verified with [name]. He requested a return phone call.  Plan: RN CM will contact patient within one week.  [name], RN, BSN, MHA/MSL, [place] Telephonic Care Manager Coordinator [facility] Direct Phone: [phone] Toll Free: [phone] Fax: [phone]

## 2016-11-06 ENCOUNTER — Other Ambulatory Visit: Payer: Self-pay | Admitting: *Deleted

## 2016-11-06 NOTE — Patient Outreach (Signed)
[place] [facility]) Care Management  11/06/2016  Katherine Olson 18-Feb-1927 [id number]  Transition of Care Referral Referral Date: 11/03/16 Referral Source: [employer] Referral Date of Discharge: 10/31/16 Facility: [facility] Discharge Diagnosis:  Insurance: [facility]  Outreach attempt # 2 to patient. Spoke with patient's son and HIPAA verified. Katherine Olson asked if [name] was affiliated with [facility]. [name] services and benefits reviewed and discussed with Katherine Olson. Katherine Olson refused [name] services. He declined to receive an outreach letter explaining [name] services.  Plan: RN CM will notify [name] CM administrative assistant regarding case closure.    [name], RN, BSN, MHA/MSL, [place] Telephonic Care Manager Coordinator [facility] Direct Phone: [phone] Toll Free: [phone] Fax: [phone]

## 2017-01-04 ENCOUNTER — Other Ambulatory Visit: Payer: Self-pay | Admitting: Family Medicine

## 2017-01-04 ENCOUNTER — Encounter: Payer: Self-pay | Admitting: Family Medicine

## 2017-01-04 NOTE — Telephone Encounter (Signed)
Please contact pharmacy and patient and confirm dosing. Thanks.

## 2017-01-04 NOTE — Telephone Encounter (Signed)
Last OV with DR.Cook 08/08/15 last filled 12/04/15 90 3rf Last filled by Tanya 10 mg citalopram 10/16/16 90 1rf but mychart message requested 20mg 

## 2017-01-05 ENCOUNTER — Encounter: Payer: Self-pay | Admitting: Family

## 2017-01-05 ENCOUNTER — Other Ambulatory Visit: Payer: Self-pay | Admitting: Family Medicine

## 2017-01-05 MED ORDER — CITALOPRAM HYDROBROMIDE 20 MG PO TABS: ORAL_TABLET | ORAL | 0 refills | Status: DC

## 2017-01-05 MED ORDER — CITALOPRAM HYDROBROMIDE 10 MG PO TABS: 10.0000 mg | ORAL_TABLET | Freq: Every day | ORAL | 0 refills | Status: DC

## 2017-01-05 NOTE — Addendum Note (Signed)
Addended by: Johna Sheriff on: 01/05/2017 04:08 PM   Modules accepted: Orders

## 2017-01-05 NOTE — Telephone Encounter (Signed)
Per verbal orders from Dr Derrel Nip ok to fill citalopram until next appointment . Script sent in . Larena Glassman advised patients son script sent in.

## 2017-01-05 NOTE — Telephone Encounter (Signed)
Copied from [place] #[id number]. Topic: Quick Communication - See Telephone Encounter >> Jan 05, 2017 11:04 AM Robina Ade, Helene Kelp D wrote: CRM for notification. See Telephone encounter for: 01/05/17. Patient son called and said that patient needs refill on her citalopram (CELEXA) 20 MG tablet and citalopram (CELEXA) 10 MG tablet since patient takes 30 mg and sent it to her pharmacy.

## 2017-01-05 NOTE — Telephone Encounter (Deleted)
Pt's son says to call him back with any other questions

## 2017-01-05 NOTE — Telephone Encounter (Signed)
Next office visit 04/20/17 Katherine Olson  Last office visit 08/08/15

## 2017-01-05 NOTE — Telephone Encounter (Signed)
Please advise 

## 2017-01-05 NOTE — Addendum Note (Signed)
Addended by: Geanie Cooley C on: 01/05/2017 04:00 PM   Modules accepted: Orders

## 2017-01-05 NOTE — Telephone Encounter (Addendum)
Pt's son has contacted the pharmacy and there was no refills left so pharmacy has reached out to office to refill that medicine. Pt's son said to call back if you have anymore questions.

## 2017-01-05 NOTE — Telephone Encounter (Signed)
Left message for pt to call office back regarding if the pt's son reached out to the pharmacy for the refill of Celexa.

## 2017-01-06 NOTE — Telephone Encounter (Signed)
Noted.  It appears Cyril Mourning ordered this.  Thanks.

## 2017-01-06 NOTE — Telephone Encounter (Signed)
Per pharmacy, patient is taking citalopram 10 mg and citalopram 20 mg  A total of 30 mg daily. Patient son states he has already received refill, given by Dr.Tullo

## 2017-01-07 ENCOUNTER — Encounter: Payer: Self-pay | Admitting: Family

## 2017-01-25 ENCOUNTER — Other Ambulatory Visit: Payer: Self-pay | Admitting: Family Medicine

## 2017-01-26 ENCOUNTER — Telehealth: Payer: Self-pay | Admitting: Internal Medicine

## 2017-01-26 NOTE — Telephone Encounter (Signed)
90-day supply sent in.  Patient needs follow-up scheduled in that timeframe.  Patient additionally needs a CMP and lipid panel fasting in the next several weeks if possible.

## 2017-01-26 NOTE — Telephone Encounter (Signed)
Pt's son is calling and wanting an update on the refill. He needs it sent back today to the pharmacy because she is almost out. He said she can have the generic also.

## 2017-01-26 NOTE — Telephone Encounter (Signed)
Medication refill request for Zetia. / It doesn't appear that the patient has had a recent appointment.  A lot of cancelled office visits noted.  Patient may be seeing another primary car provider /

## 2017-01-26 NOTE — Telephone Encounter (Signed)
Copied from [place]. Topic: Quick Communication - See Telephone Encounter >> Jan 26, 2017  9:35 AM Cleaster Corin, NT wrote: CRM for notification. See Telephone encounter for:   01/26/17. Pt. Needs refill on her medication Zetia. [facility] called and said they have tried to fax but not getting through.    [facility], [place], [place] [address] Phone: [phone] Fax: [phone] Not a 24 hour pharmacy; exact hours not known

## 2017-01-26 NOTE — Telephone Encounter (Signed)
Please advise to refill Zetia, No labs since 08/2015 and no OV since 08/2015

## 2017-01-27 NOTE — Telephone Encounter (Signed)
There will be no requests made fr refill due to patient not being seen within a year and a half. Patient needs to schedule appointment.

## 2017-01-27 NOTE — Telephone Encounter (Signed)
Please advise 

## 2017-01-28 ENCOUNTER — Ambulatory Visit: Payer: Self-pay

## 2017-01-28 ENCOUNTER — Encounter: Payer: Self-pay | Admitting: Family

## 2017-02-09 ENCOUNTER — Other Ambulatory Visit: Payer: Self-pay | Admitting: Family

## 2017-02-09 NOTE — Telephone Encounter (Signed)
Copied from [place] [id number]. Topic: Quick Communication - See Telephone Encounter >> Feb 09, 2017  9:33 AM Ahmed Prima L wrote: CRM for notification. See Telephone encounter for:   02/09/17. Pt's son called and stated he is not happy because he said his mother has been on Citalopram 10mg  & 20mg , her former PCP was Lacinda Axon & she will be seeing Dr. Vidal Schwalbe in April. He said she never has refills on her meds and she has been on this med for a very long time. He needs her to have more than one refill on these meds so she doesn't have to wait on a new refill be approved. His call back Is [phone]. Pharmacy is [facility]. Please Advise.

## 2017-02-10 ENCOUNTER — Other Ambulatory Visit: Payer: Self-pay

## 2017-02-10 NOTE — Telephone Encounter (Signed)
Checked with Tice for son - pt. Has refills in both of her Celexa orders.

## 2017-02-15 ENCOUNTER — Telehealth: Payer: Self-pay

## 2017-02-15 ENCOUNTER — Other Ambulatory Visit: Payer: Self-pay | Admitting: Internal Medicine

## 2017-02-15 DIAGNOSIS — L603 Nail dystrophy: Secondary | ICD-10-CM

## 2017-02-15 NOTE — Telephone Encounter (Signed)
Son aware.

## 2017-02-15 NOTE — Telephone Encounter (Signed)
done

## 2017-02-15 NOTE — Telephone Encounter (Signed)
Can we place referral to podiatry?   Copied from [place] #[id number]. Topic: Referral - Request >> Feb 15, 2017  9:35 AM Lennox Solders wrote: Reason for CRM: pt son is requesting a referral to [facility] for his mother their phone [phone]. Pt son is requesting the referral to having his mother toenails cut. Pt is not dm. Pt has [employer]. Pt has an appt with arnett in April 2019

## 2017-02-24 ENCOUNTER — Encounter: Payer: Self-pay | Admitting: *Deleted

## 2017-02-24 ENCOUNTER — Other Ambulatory Visit: Payer: Self-pay | Admitting: Family Medicine

## 2017-03-03 NOTE — Telephone Encounter (Signed)
Reported this message to practice administrator on 02/26/17

## 2017-03-08 ENCOUNTER — Ambulatory Visit: Payer: Commercial Managed Care - HMO | Admitting: Podiatry

## 2017-03-08 ENCOUNTER — Encounter: Payer: Self-pay | Admitting: Podiatry

## 2017-03-08 DIAGNOSIS — B351 Tinea unguium: Secondary | ICD-10-CM

## 2017-03-08 DIAGNOSIS — L84 Corns and callosities: Secondary | ICD-10-CM

## 2017-03-08 DIAGNOSIS — M79675 Pain in left toe(s): Secondary | ICD-10-CM | POA: Diagnosis not present

## 2017-03-08 DIAGNOSIS — M79674 Pain in right toe(s): Secondary | ICD-10-CM | POA: Diagnosis not present

## 2017-03-08 NOTE — Progress Notes (Signed)
   Subjective:    Patient ID: Katherine Olson, female    DOB: 1926-05-17, 84 y.o.   MRN: [id number]  HPIthis patient presents the office with chief complaint of long thick painful nails.  Patient states that nails are painful walking and wearing her shoes.  She also has a painful corn on the fifth toe of the right foot.  Patient is unable to self treat.  Patient presents the office today for an evaluation and treatment of her painful feet.  She was referred to the office by her medical doctor    Review of Systems  Musculoskeletal: Positive for back pain and myalgias.  All other systems reviewed and are negative.      Objective:   Physical Exam General Appearance  Alert, conversant and in no acute stress.  Vascular  Dorsalis pedis and posterior pulses are palpable  bilaterally.  Capillary return is within normal limits  bilaterally. Temperature is within normal limits  Bilaterally.  Neurologic  Senn-Weinstein monofilament wire test within normal limits  bilaterally. Muscle power within normal limits bilaterally.  Nails Thick disfigured discolored nails with subungual debris bilaterally from hallux to fifth toes bilaterally. No evidence of bacterial infection or drainage bilaterally.  Orthopedic  No limitations of motion of motion feet bilaterally.  No crepitus or effusions noted.  HAV  B/L.  Adducto-varus fifth toe right foot.  Skin  normotropic skin with no porokeratosis noted bilaterally.  No signs of infections or ulcers noted. Corn on plantar aspect fifth toe right foot         Assessment & Plan:  Onychomycosis  B/L  Corn secondary hammer toe   IE  Debride nails.  Debride corn fifth toe  RTC 3 months.   Gardiner Barefoot DPM

## 2017-03-17 ENCOUNTER — Encounter: Payer: Self-pay | Admitting: Internal Medicine

## 2017-03-17 ENCOUNTER — Ambulatory Visit (INDEPENDENT_AMBULATORY_CARE_PROVIDER_SITE_OTHER): Payer: Medicare HMO | Admitting: Internal Medicine

## 2017-03-17 VITALS — BP 130/82 | HR 60 | Temp 98.0°F | Ht 66.5 in | Wt 154.2 lb

## 2017-03-17 DIAGNOSIS — I10 Essential (primary) hypertension: Secondary | ICD-10-CM | POA: Diagnosis not present

## 2017-03-17 DIAGNOSIS — G301 Alzheimer's disease with late onset: Secondary | ICD-10-CM

## 2017-03-17 DIAGNOSIS — Z23 Encounter for immunization: Secondary | ICD-10-CM

## 2017-03-17 DIAGNOSIS — F419 Anxiety disorder, unspecified: Secondary | ICD-10-CM | POA: Diagnosis not present

## 2017-03-17 DIAGNOSIS — F0280 Dementia in other diseases classified elsewhere without behavioral disturbance: Secondary | ICD-10-CM | POA: Diagnosis not present

## 2017-03-17 DIAGNOSIS — Z1329 Encounter for screening for other suspected endocrine disorder: Secondary | ICD-10-CM | POA: Diagnosis not present

## 2017-03-17 DIAGNOSIS — E785 Hyperlipidemia, unspecified: Secondary | ICD-10-CM

## 2017-03-17 DIAGNOSIS — M8580 Other specified disorders of bone density and structure, unspecified site: Secondary | ICD-10-CM | POA: Diagnosis not present

## 2017-03-17 DIAGNOSIS — E559 Vitamin D deficiency, unspecified: Secondary | ICD-10-CM | POA: Diagnosis not present

## 2017-03-17 LAB — CBC WITH DIFFERENTIAL/PLATELET
Basophils Absolute: 0.1 10*3/uL (ref 0.0–0.1)
Basophils Relative: 1 % (ref 0.0–3.0)
EOS ABS: 0.1 10*3/uL (ref 0.0–0.7)
Eosinophils Relative: 1.1 % (ref 0.0–5.0)
HEMATOCRIT: 39.2 % (ref 36.0–46.0)
Hemoglobin: 13 g/dL (ref 12.0–15.0)
LYMPHS PCT: 29.8 % (ref 12.0–46.0)
Lymphs Abs: 2.2 10*3/uL (ref 0.7–4.0)
MCHC: 33.3 g/dL (ref 30.0–36.0)
MCV: 93 fl (ref 78.0–100.0)
MONO ABS: 0.6 10*3/uL (ref 0.1–1.0)
Monocytes Relative: 7.9 % (ref 3.0–12.0)
Neutro Abs: 4.5 10*3/uL (ref 1.4–7.7)
Neutrophils Relative %: 60.2 % (ref 43.0–77.0)
Platelets: 220 10*3/uL (ref 150.0–400.0)
RBC: 4.22 Mil/uL (ref 3.87–5.11)
RDW: 13.9 % (ref 11.5–15.5)
WBC: 7.5 10*3/uL (ref 4.0–10.5)

## 2017-03-17 LAB — COMPREHENSIVE METABOLIC PANEL
ALBUMIN: 4.1 g/dL (ref 3.5–5.2)
ALK PHOS: 54 U/L (ref 39–117)
ALT: 7 U/L (ref 0–35)
AST: 14 U/L (ref 0–37)
BUN: 19 mg/dL (ref 6–23)
CALCIUM: 9.9 mg/dL (ref 8.4–10.5)
CO2: 28 mEq/L (ref 19–32)
CREATININE: 1.02 mg/dL (ref 0.40–1.20)
Chloride: 104 mEq/L (ref 96–112)
GFR: 65.66 mL/min (ref >60.00)
Glucose, Bld: 94 mg/dL (ref 70–99)
POTASSIUM: 4 meq/L (ref 3.5–5.1)
Sodium: 139 mEq/L (ref 135–145)
TOTAL PROTEIN: 7.2 g/dL (ref 6.0–8.3)
Total Bilirubin: 0.4 mg/dL (ref 0.2–1.2)

## 2017-03-17 LAB — URINALYSIS, ROUTINE W REFLEX MICROSCOPIC
BILIRUBIN URINE: NEGATIVE
KETONES UR: NEGATIVE
Leukocytes, UA: NEGATIVE
NITRITE: NEGATIVE
RBC / HPF: NONE SEEN (ref 0–?)
Specific Gravity, Urine: 1.02 (ref 1.000–1.030)
TOTAL PROTEIN, URINE-UPE24: NEGATIVE
URINE GLUCOSE: NEGATIVE
Urobilinogen, UA: 0.2 (ref 0.0–1.0)
pH: 6 (ref 5.0–8.0)

## 2017-03-17 LAB — LIPID PANEL
CHOL/HDL RATIO: 3
CHOLESTEROL: 207 mg/dL — AB (ref 0–200)
HDL: 65.9 mg/dL (ref >39.00)
LDL Cholesterol: 126 mg/dL — ABNORMAL HIGH (ref 0–99)
NonHDL: 141.03
TRIGLYCERIDES: 73 mg/dL (ref 0.0–149.0)
VLDL: 14.6 mg/dL (ref 0.0–40.0)

## 2017-03-17 LAB — VITAMIN D 25 HYDROXY (VIT D DEFICIENCY, FRACTURES): VITD: 36.04 ng/mL (ref 30.00–100.00)

## 2017-03-17 LAB — TSH: TSH: 3.02 u[IU]/mL (ref 0.35–4.50)

## 2017-03-17 MED ORDER — EZETIMIBE 10 MG PO TABS: 10.0000 mg | ORAL_TABLET | Freq: Every day | ORAL | 1 refills | Status: DC

## 2017-03-17 MED ORDER — CITALOPRAM HYDROBROMIDE 20 MG PO TABS: 20.0000 mg | ORAL_TABLET | Freq: Every day | ORAL | 1 refills | Status: DC

## 2017-03-17 MED ORDER — LOSARTAN POTASSIUM-HCTZ 50-12.5 MG PO TABS: 1.0000 | ORAL_TABLET | Freq: Every day | ORAL | 1 refills | Status: DC

## 2017-03-17 MED ORDER — CITALOPRAM HYDROBROMIDE 10 MG PO TABS: 10.0000 mg | ORAL_TABLET | Freq: Every day | ORAL | 1 refills | Status: DC

## 2017-03-17 MED ORDER — AMLODIPINE BESYLATE 5 MG PO TABS: 5.0000 mg | ORAL_TABLET | Freq: Every day | ORAL | 1 refills | Status: DC

## 2017-03-17 NOTE — Patient Instructions (Signed)
Please take vitamin D3 1000 IU daily with calcium carbonate 600 mg 2x per day for bone health Take care Blood pressure looks great  We will do the pneumonia 23 vaccine at follow up  Consider shingrix vaccine when available at your pharmacy for shingles prevention.    Hypertension Hypertension, commonly called high blood pressure, is when the force of blood pumping through the arteries is too strong. The arteries are the blood vessels that carry blood from the heart throughout the body. Hypertension forces the heart to work harder to pump blood and may cause arteries to become narrow or stiff. Having untreated or uncontrolled hypertension can cause heart attacks, strokes, kidney disease, and other problems. A blood pressure reading consists of a higher number over a lower number. Ideally, your blood pressure should be below 120/80. The first ("top") number is called the systolic pressure. It is a measure of the pressure in your arteries as your heart beats. The second ("bottom") number is called the diastolic pressure. It is a measure of the pressure in your arteries as the heart relaxes. What are the causes? The cause of this condition is not known. What increases the risk? Some risk factors for high blood pressure are under your control. Others are not. Factors you can change  Smoking.  Having type 2 diabetes mellitus, high cholesterol, or both.  Not getting enough exercise or physical activity.  Being overweight.  Having too much fat, sugar, calories, or salt (sodium) in your diet.  Drinking too much alcohol. Factors that are difficult or impossible to change  Having chronic kidney disease.  Having a family history of high blood pressure.  Age. Risk increases with age.  Race. You may be at higher risk if you are African-American.  Gender. Men are at higher risk than women before age 10. After age 74, women are at higher risk than men.  Having obstructive sleep  apnea.  Stress. What are the signs or symptoms? Extremely high blood pressure (hypertensive crisis) may cause:  Headache.  Anxiety.  Shortness of breath.  Nosebleed.  Nausea and vomiting.  Severe chest pain.  Jerky movements you cannot control (seizures).  How is this diagnosed? This condition is diagnosed by measuring your blood pressure while you are seated, with your arm resting on a surface. The cuff of the blood pressure monitor will be placed directly against the skin of your upper arm at the level of your heart. It should be measured at least twice using the same arm. Certain conditions can cause a difference in blood pressure between your right and left arms. Certain factors can cause blood pressure readings to be lower or higher than normal (elevated) for a short period of time:  When your blood pressure is higher when you are in a health care provider's office than when you are at home, this is called white coat hypertension. Most people with this condition do not need medicines.  When your blood pressure is higher at home than when you are in a health care provider's office, this is called masked hypertension. Most people with this condition may need medicines to control blood pressure.  If you have a high blood pressure reading during one visit or you have normal blood pressure with other risk factors:  You may be asked to return on a different day to have your blood pressure checked again.  You may be asked to monitor your blood pressure at home for 1 week or longer.  If you are  diagnosed with hypertension, you may have other blood or imaging tests to help your health care provider understand your overall risk for other conditions. How is this treated? This condition is treated by making healthy lifestyle changes, such as eating healthy foods, exercising more, and reducing your alcohol intake. Your health care provider may prescribe medicine if lifestyle changes are  not enough to get your blood pressure under control, and if:  Your systolic blood pressure is above 130.  Your diastolic blood pressure is above 80.  Your personal target blood pressure may vary depending on your medical conditions, your age, and other factors. Follow these instructions at home: Eating and drinking  Eat a diet that is high in fiber and potassium, and low in sodium, added sugar, and fat. An example eating plan is called the DASH (Dietary Approaches to Stop Hypertension) diet. To eat this way: ? Eat plenty of fresh fruits and vegetables. Try to fill half of your plate at each meal with fruits and vegetables. ? Eat whole grains, such as whole wheat pasta, brown rice, or whole grain bread. Fill about one quarter of your plate with whole grains. ? Eat or drink low-fat dairy products, such as skim milk or low-fat yogurt. ? Avoid fatty cuts of meat, processed or cured meats, and poultry with skin. Fill about one quarter of your plate with lean proteins, such as fish, chicken without skin, beans, eggs, and tofu. ? Avoid premade and processed foods. These tend to be higher in sodium, added sugar, and fat.  Reduce your daily sodium intake. Most people with hypertension should eat less than 1,500 mg of sodium a day.  Limit alcohol intake to no more than 1 drink a day for nonpregnant women and 2 drinks a day for men. One drink equals 12 oz of beer, 5 oz of wine, or 1 oz of hard liquor. Lifestyle  Work with your health care provider to maintain a healthy body weight or to lose weight. Ask what an ideal weight is for you.  Get at least 30 minutes of exercise that causes your heart to beat faster (aerobic exercise) most days of the week. Activities may include walking, swimming, or biking.  Include exercise to strengthen your muscles (resistance exercise), such as pilates or lifting weights, as part of your weekly exercise routine. Try to do these types of exercises for 30 minutes at  least 3 days a week.  Do not use any products that contain nicotine or tobacco, such as cigarettes and e-cigarettes. If you need help quitting, ask your health care provider.  Monitor your blood pressure at home as told by your health care provider.  Keep all follow-up visits as told by your health care provider. This is important. Medicines  Take over-the-counter and prescription medicines only as told by your health care provider. Follow directions carefully. Blood pressure medicines must be taken as prescribed.  Do not skip doses of blood pressure medicine. Doing this puts you at risk for problems and can make the medicine less effective.  Ask your health care provider about side effects or reactions to medicines that you should watch for. Contact a health care provider if:  You think you are having a reaction to a medicine you are taking.  You have headaches that keep coming back (recurring).  You feel dizzy.  You have swelling in your ankles.  You have trouble with your vision. Get help right away if:  You develop a severe headache or confusion.  You have unusual weakness or numbness.  You feel faint.  You have severe pain in your chest or abdomen.  You vomit repeatedly.  You have trouble breathing. Summary  Hypertension is when the force of blood pumping through your arteries is too strong. If this condition is not controlled, it may put you at risk for serious complications.  Your personal target blood pressure may vary depending on your medical conditions, your age, and other factors. For most people, a normal blood pressure is less than 120/80.  Hypertension is treated with lifestyle changes, medicines, or a combination of both. Lifestyle changes include weight loss, eating a healthy, low-sodium diet, exercising more, and limiting alcohol. This information is not intended to replace advice given to you by your health care provider. Make sure you discuss any  questions you have with your health care provider. Document Released: 02/16/2005 Document Revised: 01/15/2016 Document Reviewed: 01/15/2016 Elsevier Interactive Patient Education  [employer].

## 2017-03-17 NOTE — Addendum Note (Signed)
Addended by: Leeanne Rio on: 03/17/2017 10:15 AM   Modules accepted: Orders

## 2017-03-17 NOTE — Addendum Note (Signed)
Addended by: Arby Barrette on: 03/17/2017 11:04 AM   Modules accepted: Orders

## 2017-03-17 NOTE — Progress Notes (Signed)
Chief Complaint  Patient presents with  . Follow-up   F/u with son Katherine Olson pt doing well no complaints  1. HTN BP controlled on medications needs refills  2. Mood is great on Celexa 30 mg total qd would like refill    Review of Systems  Constitutional: Negative for weight loss.  HENT: Negative for hearing loss.   Eyes:       Wears glasses   Respiratory: Negative for shortness of breath.   Cardiovascular: Negative for chest pain.  Gastrointestinal: Positive for constipation. Negative for abdominal pain.       Appetite normal   Genitourinary:       No urine sx's   Musculoskeletal: Negative for falls.  Skin: Negative for rash.  Neurological: Negative for headaches.  Psychiatric/Behavioral: Positive for memory loss. Negative for depression.   Past Medical History:  Diagnosis Date  . Depression   . Hyperlipidemia   . Hypertension    Past Surgical History:  Procedure Laterality Date  . ABDOMINAL HYSTERECTOMY     partial  . JOINT REPLACEMENT Right 1991   hip   Family History  Problem Relation Age of Onset  . Alzheimer's disease Brother   . Heart disease Sister   . Cancer Sister        kidney  . Cancer Mother        liver  . Cancer Father        prostate   Social History   Socioeconomic History  . Marital status: Married    Spouse name: Not on file  . Number of children: Not on file  . Years of education: Not on file  . Highest education level: Not on file  Social Needs  . Financial resource strain: Not on file  . Food insecurity - worry: Not on file  . Food insecurity - inability: Not on file  . Transportation needs - medical: Not on file  . Transportation needs - non-medical: Not on file  Occupational History    Employer: RETIRED    Comment: housewife  Tobacco Use  . Smoking status: Never Smoker  . Smokeless tobacco: Never Used  Substance and Sexual Activity  . Alcohol use: No  . Drug use: No  . Sexual activity: Not Currently    Birth  control/protection: Surgical  Other Topics Concern  . Not on file  Social History Narrative   Her son lives with her and cooks her meals   Her son also does her finances   Current Meds  Medication Sig  . amLODipine (NORVASC) 5 MG tablet Take 1 tablet (5 mg total) by mouth daily. In am  . citalopram (CELEXA) 20 MG tablet Take 1 tablet (20 mg total) by mouth daily. In am. With 10 mg to equal 30 mg daily in am  . Coenzyme Q-10 200 MG CAPS Take by mouth.  . ezetimibe (ZETIA) 10 MG tablet Take 1 tablet (10 mg total) by mouth daily.  Marland Kitchen losartan-hydrochlorothiazide (HYZAAR) 50-12.5 MG tablet Take 1 tablet by mouth daily. In am  . Multiple Vitamins-Minerals (MULTIVITAMIN WITH MINERALS) tablet Take 1 tablet by mouth daily.  . Omega-3 Fatty Acids (FISH OIL PO) Take by mouth.  . [DISCONTINUED] amLODipine (NORVASC) 5 MG tablet Take 5 mg by mouth.  . [DISCONTINUED] citalopram (CELEXA) 20 MG tablet Take 20 mg by mouth.   . [DISCONTINUED] ezetimibe (ZETIA) 10 MG tablet TAKE ONE TABLET BY MOUTH EVERY DAY  . [DISCONTINUED] losartan-hydrochlorothiazide (HYZAAR) 50-12.5 MG tablet Take by mouth.  Allergies  Allergen Reactions  . Zocor [Simvastatin] Other (See Comments)    Stiffness in joints   No results found for this or any previous visit (from the past 2160 hour(s)). Objective  Body mass index is 24.52 kg/m. Wt Readings from Last 3 Encounters:  03/17/17 154 lb 4 oz (70 kg)  09/05/15 170 lb 1.6 oz (77.2 kg)  08/08/15 169 lb (76.7 kg)   Temp Readings from Last 3 Encounters:  03/17/17 98 F (36.7 C) (Oral)  08/08/15 98.1 F (36.7 C) (Oral)  07/30/15 98.6 F (37 C)   BP Readings from Last 3 Encounters:  03/17/17 130/82  09/05/15 (!) 178/77  08/08/15 (!) 148/72   Pulse Readings from Last 3 Encounters:  03/17/17 60  09/05/15 67  08/08/15 63   O2 sat 93% room air  Physical Exam  Constitutional: She is oriented to person, place, and time and well-developed, well-nourished, and in no  distress. Vital signs are normal.  HENT:  Head: Normocephalic and atraumatic.  Mouth/Throat: Oropharynx is clear and moist and mucous membranes are normal. She has dentures.  Eyes: Conjunctivae are normal. Pupils are equal, round, and reactive to light.  Cardiovascular: Normal rate, regular rhythm and normal heart sounds.  Pulmonary/Chest: Effort normal and breath sounds normal. She has no wheezes.  Abdominal: Soft. Bowel sounds are normal. There is no tenderness.  Neurological: She is alert and oriented to person, place, and time. Gait normal. Gait normal.  Skin: Skin is warm, dry and intact.  Psychiatric: Mood, memory, affect and judgment normal.  Nursing note and vitals reviewed.   Assessment   1.HTN/HLD 2. Dementia limited iadls some cooking/cleaning no driving  3. HM Plan  1 Cont meds refilled today  Check labs CMET, CBC, lipid, TSH, UA, vit D today  F/u 05/2017  2. Lives with son Katherine Olson who helps No falls  Continue celexa 30 mg qam for anxiety/mood Consider MMSE at f/u to see if could benefit from [place] vs other tx opts    3. Given flu shot today  UTD tdap, prevanar Consider shingrix  Will give pna 23 at f/u been >5 years   DEXA 10/11/14 osteopenia rec calcium 600 mg bid with meals and vit D at least 1000 IU qd  Out of age window mammo, pap, colonoscopy last had 02/27/14   Provider: Dr. Olivia Mackie McLean-Scocuzza-Internal Medicine

## 2017-03-18 ENCOUNTER — Telehealth: Payer: Self-pay

## 2017-03-18 NOTE — Telephone Encounter (Signed)
Copied from [place]. Topic: General - Other >> Mar 18, 2017  8:47 AM Marin Olp L wrote: Reason for CRM: Son was instructed to buy Vitamin D3 1000IU w/ Calcium Carbonate 600mg  for mom but the phams dont make that combination. They do have the D3  1000IU by itself but not w/ carbonate. They also have D3 800IU w/ Carbonate 600mg . Please call back to advise him on what to get.

## 2017-03-18 NOTE — Telephone Encounter (Signed)
Patient's son has been informed

## 2017-03-18 NOTE — Telephone Encounter (Signed)
Either choice  is fine since her last vitamin d level was ok.  .  If his mom prefers to take one pill,  She can use the one with 800 Ius of D3 and 8600 mg calcium.

## 2017-03-18 NOTE — Telephone Encounter (Signed)
Please advise 

## 2017-03-20 ENCOUNTER — Encounter: Payer: Self-pay | Admitting: Family

## 2017-03-21 ENCOUNTER — Encounter: Payer: Self-pay | Admitting: Family

## 2017-03-22 ENCOUNTER — Encounter: Payer: Self-pay | Admitting: Family

## 2017-03-22 ENCOUNTER — Other Ambulatory Visit: Payer: Self-pay | Admitting: Internal Medicine

## 2017-03-22 DIAGNOSIS — R319 Hematuria, unspecified: Secondary | ICD-10-CM

## 2017-03-22 NOTE — Telephone Encounter (Signed)
Son called - dale - he would like to have abx called in since there was blood and mucus found in mothers urine.  He states he thinks he should not have to make an appt since pt was just seen, that is how the blood was found.  Please call or mychart son back ( whichever is faster) cb# is [phone]

## 2017-03-24 DIAGNOSIS — K8044 Calculus of bile duct with chronic cholecystitis without obstruction: Secondary | ICD-10-CM | POA: Diagnosis not present

## 2017-03-24 DIAGNOSIS — R1110 Vomiting, unspecified: Secondary | ICD-10-CM | POA: Diagnosis not present

## 2017-03-24 DIAGNOSIS — R1011 Right upper quadrant pain: Secondary | ICD-10-CM | POA: Diagnosis not present

## 2017-03-24 DIAGNOSIS — N179 Acute kidney failure, unspecified: Secondary | ICD-10-CM | POA: Diagnosis not present

## 2017-03-24 DIAGNOSIS — K8050 Calculus of bile duct without cholangitis or cholecystitis without obstruction: Secondary | ICD-10-CM | POA: Diagnosis not present

## 2017-03-24 DIAGNOSIS — Z66 Do not resuscitate: Secondary | ICD-10-CM | POA: Diagnosis not present

## 2017-03-24 DIAGNOSIS — F0280 Dementia in other diseases classified elsewhere without behavioral disturbance: Secondary | ICD-10-CM | POA: Diagnosis not present

## 2017-03-24 DIAGNOSIS — K851 Biliary acute pancreatitis without necrosis or infection: Secondary | ICD-10-CM | POA: Diagnosis not present

## 2017-03-24 DIAGNOSIS — K859 Acute pancreatitis without necrosis or infection, unspecified: Secondary | ICD-10-CM | POA: Diagnosis not present

## 2017-03-24 DIAGNOSIS — I2510 Atherosclerotic heart disease of native coronary artery without angina pectoris: Secondary | ICD-10-CM | POA: Diagnosis not present

## 2017-03-24 DIAGNOSIS — I10 Essential (primary) hypertension: Secondary | ICD-10-CM | POA: Diagnosis not present

## 2017-03-24 DIAGNOSIS — G309 Alzheimer's disease, unspecified: Secondary | ICD-10-CM | POA: Diagnosis not present

## 2017-03-24 DIAGNOSIS — E785 Hyperlipidemia, unspecified: Secondary | ICD-10-CM | POA: Diagnosis not present

## 2017-03-24 DIAGNOSIS — R188 Other ascites: Secondary | ICD-10-CM | POA: Diagnosis not present

## 2017-03-24 DIAGNOSIS — R8290 Unspecified abnormal findings in urine: Secondary | ICD-10-CM | POA: Diagnosis not present

## 2017-03-24 DIAGNOSIS — F0390 Unspecified dementia without behavioral disturbance: Secondary | ICD-10-CM | POA: Diagnosis not present

## 2017-03-24 DIAGNOSIS — R1033 Periumbilical pain: Secondary | ICD-10-CM | POA: Diagnosis not present

## 2017-03-24 DIAGNOSIS — Z4659 Encounter for fitting and adjustment of other gastrointestinal appliance and device: Secondary | ICD-10-CM | POA: Diagnosis not present

## 2017-03-24 DIAGNOSIS — N2889 Other specified disorders of kidney and ureter: Secondary | ICD-10-CM | POA: Diagnosis not present

## 2017-03-24 DIAGNOSIS — K838 Other specified diseases of biliary tract: Secondary | ICD-10-CM | POA: Diagnosis not present

## 2017-03-24 DIAGNOSIS — K8689 Other specified diseases of pancreas: Secondary | ICD-10-CM | POA: Diagnosis not present

## 2017-04-01 ENCOUNTER — Other Ambulatory Visit: Payer: Self-pay | Admitting: *Deleted

## 2017-04-01 NOTE — Patient Outreach (Signed)
[facility]) Care Management  04/01/2017  Katherine Olson September 28, 1926 [id number]  Received referral via [place]; member discharged from inpatient admission  [facility] 03/28/2017.  Telephone call to patient; caregiver/son answered call and was advised of reason for call & [facility] care management services.   He stated patient was fine and did not need case management services.  Caregiver/son refused "TOC" assessment & other [facility] care management services.   Plan: Send to care management assistant for case closure.  Katherine Daisy, RN BSN [place] Management Coordinator [facility] Care Management  [phone]

## 2017-04-05 ENCOUNTER — Ambulatory Visit: Payer: Self-pay | Admitting: Internal Medicine

## 2017-04-06 DIAGNOSIS — H16223 Keratoconjunctivitis sicca, not specified as Sjogren's, bilateral: Secondary | ICD-10-CM | POA: Diagnosis not present

## 2017-04-06 DIAGNOSIS — H2511 Age-related nuclear cataract, right eye: Secondary | ICD-10-CM | POA: Diagnosis not present

## 2017-04-19 ENCOUNTER — Encounter: Payer: Self-pay | Admitting: Family

## 2017-04-20 ENCOUNTER — Encounter: Payer: Self-pay | Admitting: Family

## 2017-05-03 ENCOUNTER — Encounter: Payer: Self-pay | Admitting: Family

## 2017-06-07 ENCOUNTER — Ambulatory Visit: Payer: Medicare HMO | Admitting: Podiatry

## 2017-06-07 ENCOUNTER — Encounter: Payer: Self-pay | Admitting: Family

## 2017-06-07 ENCOUNTER — Encounter: Payer: Self-pay | Admitting: Podiatry

## 2017-06-07 DIAGNOSIS — M79674 Pain in right toe(s): Secondary | ICD-10-CM

## 2017-06-07 DIAGNOSIS — B351 Tinea unguium: Secondary | ICD-10-CM

## 2017-06-07 DIAGNOSIS — L84 Corns and callosities: Secondary | ICD-10-CM

## 2017-06-07 DIAGNOSIS — M79675 Pain in left toe(s): Secondary | ICD-10-CM

## 2017-06-07 NOTE — Progress Notes (Signed)
Complaint:  Visit Type: Patient returns to my office for continued preventative foot care services. Complaint: Patient states" my nails have grown long and thick and become painful to walk and wear shoes" Patient has painful corn fifth toe right foot. The patient presents for preventative foot care services. No changes to ROS  Podiatric Exam: Vascular: dorsalis pedis and posterior tibial pulses are palpable bilateral. Capillary return is immediate. Temperature gradient is WNL. Skin turgor WNL  Sensorium: Normal Semmes Weinstein monofilament test. Normal tactile sensation bilaterally. Nail Exam: Pt has thick disfigured discolored nails with subungual debris noted bilateral entire nail hallux through fifth toenails Ulcer Exam: There is no evidence of ulcer or pre-ulcerative changes or infection. Orthopedic Exam: Muscle tone and strength are WNL. No limitations in general ROM. No crepitus or effusions noted. Foot type and digits show no abnormalities. HAV  B/L.Marland Kitchen Hammer toe 2,5 lefrt foot. Skin: No Porokeratosis. No infection or ulcers  Callus noted fifth toe right foot.  Diagnosis:  Onychomycosis, , Pain in right toe, pain in left toes  Treatment & Plan Procedures and Treatment: Consent by patient was obtained for treatment procedures.   Debridement of mycotic and hypertrophic toenails, 1 through 5 bilateral and clearing of subungual debris. No ulceration, no infection noted.  Return Visit-Office Procedure: Patient instructed to return to the office for a follow up visit 3 months for continued evaluation and treatment.    Gardiner Barefoot DPM

## 2017-06-08 ENCOUNTER — Ambulatory Visit: Payer: Self-pay

## 2017-06-08 ENCOUNTER — Encounter: Payer: Self-pay | Admitting: Family

## 2017-06-11 ENCOUNTER — Ambulatory Visit: Payer: Self-pay

## 2017-06-11 ENCOUNTER — Ambulatory Visit (INDEPENDENT_AMBULATORY_CARE_PROVIDER_SITE_OTHER): Payer: Medicare HMO | Admitting: Family

## 2017-06-11 ENCOUNTER — Encounter: Payer: Self-pay | Admitting: Family

## 2017-06-11 VITALS — BP 144/86 | HR 65 | Temp 98.1°F | Resp 16 | Ht 67.0 in | Wt 156.5 lb

## 2017-06-11 DIAGNOSIS — I10 Essential (primary) hypertension: Secondary | ICD-10-CM

## 2017-06-11 DIAGNOSIS — Z0000 Encounter for general adult medical examination without abnormal findings: Secondary | ICD-10-CM

## 2017-06-11 LAB — LIPID PANEL
CHOL/HDL RATIO: 2
Cholesterol: 195 mg/dL (ref 0–200)
HDL: 78.5 mg/dL (ref >39.00)
LDL CALC: 104 mg/dL — AB (ref 0–99)
NonHDL: 116.41
TRIGLYCERIDES: 60 mg/dL (ref 0.0–149.0)
VLDL: 12 mg/dL (ref 0.0–40.0)

## 2017-06-11 LAB — COMPREHENSIVE METABOLIC PANEL
ALT: 10 U/L (ref 0–35)
AST: 15 U/L (ref 0–37)
Albumin: 4.1 g/dL (ref 3.5–5.2)
Alkaline Phosphatase: 51 U/L (ref 39–117)
BUN: 15 mg/dL (ref 6–23)
CALCIUM: 9.9 mg/dL (ref 8.4–10.5)
CHLORIDE: 104 meq/L (ref 96–112)
CO2: 28 meq/L (ref 19–32)
Creatinine, Ser: 0.96 mg/dL (ref 0.40–1.20)
GFR: 70.38 mL/min (ref >60.00)
GLUCOSE: 91 mg/dL (ref 70–99)
POTASSIUM: 4.3 meq/L (ref 3.5–5.1)
Sodium: 138 mEq/L (ref 135–145)
Total Bilirubin: 0.4 mg/dL (ref 0.2–1.2)
Total Protein: 7.2 g/dL (ref 6.0–8.3)

## 2017-06-11 LAB — VITAMIN D 25 HYDROXY (VIT D DEFICIENCY, FRACTURES): VITD: 39.5 ng/mL (ref 30.00–100.00)

## 2017-06-11 LAB — CBC WITH DIFFERENTIAL/PLATELET
BASOS ABS: 0.1 10*3/uL (ref 0.0–0.1)
BASOS PCT: 0.8 % (ref 0.0–3.0)
Eosinophils Absolute: 0.1 10*3/uL (ref 0.0–0.7)
Eosinophils Relative: 0.9 % (ref 0.0–5.0)
HEMATOCRIT: 38.3 % (ref 36.0–46.0)
Hemoglobin: 13 g/dL (ref 12.0–15.0)
LYMPHS ABS: 2 10*3/uL (ref 0.7–4.0)
Lymphocytes Relative: 26.2 % (ref 12.0–46.0)
MCHC: 33.9 g/dL (ref 30.0–36.0)
MCV: 91.4 fl (ref 78.0–100.0)
MONOS PCT: 6.7 % (ref 3.0–12.0)
Monocytes Absolute: 0.5 10*3/uL (ref 0.1–1.0)
NEUTROS ABS: 5.1 10*3/uL (ref 1.4–7.7)
NEUTROS PCT: 65.4 % (ref 43.0–77.0)
PLATELETS: 227 10*3/uL (ref 150.0–400.0)
RBC: 4.19 Mil/uL (ref 3.87–5.11)
RDW: 14 % (ref 11.5–15.5)
WBC: 7.8 10*3/uL (ref 4.0–10.5)

## 2017-06-11 LAB — TSH: TSH: 2.49 u[IU]/mL (ref 0.35–4.50)

## 2017-06-11 LAB — HEMOGLOBIN A1C: Hgb A1c MFr Bld: 5.8 % (ref 4.6–6.5)

## 2017-06-11 NOTE — Progress Notes (Signed)
Subjective:    Patient ID: Katherine Olson, female    DOB: [birth year], 84 y.o.   MRN: [id number]  CC: SYANA DEGRAFFENREID is a 84 y.o. female who presents today for physical exam.    HPI: Son lives with her.  No wandering, leaving stove on. No falls.   States she is doing well and has no complaints today.  H/o UTI. No hospitalization for UTI.  Patient has no dysuria, urinary frequency, flank pain.  Doing well on Celexa 30mg .     Stents removed from gallbladder 03/2017; these were placed after cholecystitis in 2018,  as suspect stents were causing N, V, pain per son.  Patient denies abdominal pain.   Colorectal Cancer Screening: UTD 2015.  Breast Cancer Screening: NO longer screens.  Cervical Cancer Screening: No vaginal bleeding, pelvic pain.  Bone Health screening/DEXA for 65+: due; declines.  Lung Cancer Screening: Doesn't have 30 year pack year history and age > 37 years       Tetanus - UTD        Pneumococcal - Candidate for, done.  Labs: Screening labs done prior 03/2017. Exercise: Gets regular exercise.  Alcohol use: none Smoking/tobacco use: Nonsmoker.  Regular dental exams: Has dentures, doesn't follow with dentist.  Wears seat belt: Yes. Skin: no skin changes; no h/o skin cancer  HISTORY:  Past Medical History:  Diagnosis Date  . Depression   . Hyperlipidemia   . Hypertension     Past Surgical History:  Procedure Laterality Date  . ABDOMINAL HYSTERECTOMY     partial  . JOINT REPLACEMENT Right 1991   hip   Family History  Problem Relation Age of Onset  . Alzheimer's disease Brother   . Heart disease Sister   . Cancer Sister        kidney  . Cancer Mother        liver  . Cancer Father        prostate      ALLERGIES: Simvastatin  Current Outpatient Medications on File Prior to Visit  Medication Sig Dispense Refill  . amLODipine (NORVASC) 5 MG tablet Take 1 tablet (5 mg total) by mouth daily. In am 90 tablet 1  . citalopram (CELEXA) 10 MG tablet  Take 1 tablet (10 mg total) by mouth daily. Take with 20 mg in am to equal (30 mg total) per day 90 tablet 1  . citalopram (CELEXA) 20 MG tablet Take 1 tablet (20 mg total) by mouth daily. In am. With 10 mg to equal 30 mg daily in am 90 tablet 1  . Coenzyme Q-10 200 MG CAPS Take by mouth.    . DOCOSAHEXAENOIC ACID PO Take 1,200 mg by mouth.    . ezetimibe (ZETIA) 10 MG tablet Take 1 tablet (10 mg total) by mouth daily. 90 tablet 1  . losartan-hydrochlorothiazide (HYZAAR) 50-12.5 MG tablet Take 1 tablet by mouth daily. In am 90 tablet 1  . Multiple Vitamins-Minerals (MULTIVITAMIN WITH MINERALS) tablet Take 1 tablet by mouth daily.    . Omega-3 Fatty Acids (FISH OIL PO) Take by mouth.     No current facility-administered medications on file prior to visit.     Social History   Tobacco Use  . Smoking status: Never Smoker  . Smokeless tobacco: Never Used  Substance Use Topics  . Alcohol use: No  . Drug use: No    Review of Systems    Objective:    BP (!) 144/86 (BP Location: Left Arm, Patient  Position: Sitting, Cuff Size: Normal)   Pulse 65   Temp 98.1 F (36.7 C) (Oral)   Resp 16   Ht 5\' 7"  (1.702 m)   Wt 156 lb 8 oz (71 kg)   SpO2 98%   BMI 24.51 kg/m   BP Readings from Last 3 Encounters:  06/11/17 (!) 144/86  03/17/17 130/82  09/05/15 (!) 178/77   Wt Readings from Last 3 Encounters:  06/11/17 156 lb 8 oz (71 kg)  03/17/17 154 lb 4 oz (70 kg)  09/05/15 170 lb 1.6 oz (77.2 kg)    Physical Exam     Assessment & Plan:   Problem List Items Addressed This Visit      Cardiovascular and Mediastinum   Essential hypertension - Primary   Relevant Orders   CBC with Differential/Platelet   Comprehensive metabolic panel   Hemoglobin A1c   Lipid panel   TSH   VITAMIN D 25 Hydroxy (Vit-D Deficiency, Fractures)     Other   Routine physical examination    Long discussion with patient and son regarding preventative screening for patient.  At this time time, patient  politely declines mammogram, DEXA scan.  Clinical breast exam performed.  In the absence of any pelvic complaints today, defer pelvic exam.          I am having Katherine Olson maintain her multivitamin with minerals, DOCOSAHEXAENOIC ACID PO, Omega-3 Fatty Acids (FISH OIL PO), Coenzyme Q-10, amLODipine, citalopram, ezetimibe, losartan-hydrochlorothiazide, and citalopram.   No orders of the defined types were placed in this encounter.   Return precautions given.   Risks, benefits, and alternatives of the medications and treatment plan prescribed today were discussed, and patient expressed understanding.   Education regarding symptom management and diagnosis given to patient on AVS.   Continue to follow with Burnard Hawthorne, FNP for routine health maintenance.   Katherine Olson and I agreed with plan.   Mable Paris, FNP

## 2017-06-11 NOTE — Patient Instructions (Addendum)
Shingrex - new shingles vaccine.    You look great.   Labs today   Health Maintenance for Postmenopausal Women Menopause is a normal process in which your reproductive ability comes to an end. This process happens gradually over a span of months to years, usually between the ages of 79 and 52. Menopause is complete when you have missed 12 consecutive menstrual periods. It is important to talk with your health care provider about some of the most common conditions that affect postmenopausal women, such as heart disease, cancer, and bone loss (osteoporosis). Adopting a healthy lifestyle and getting preventive care can help to promote your health and wellness. Those actions can also lower your chances of developing some of these common conditions. What should I know about menopause? During menopause, you may experience a number of symptoms, such as:  Moderate-to-severe hot flashes.  Night sweats.  Decrease in sex drive.  Mood swings.  Headaches.  Tiredness.  Irritability.  Memory problems.  Insomnia.  Choosing to treat or not to treat menopausal changes is an individual decision that you make with your health care provider. What should I know about hormone replacement therapy and supplements? Hormone therapy products are effective for treating symptoms that are associated with menopause, such as hot flashes and night sweats. Hormone replacement carries certain risks, especially as you become older. If you are thinking about using estrogen or estrogen with progestin treatments, discuss the benefits and risks with your health care provider. What should I know about heart disease and stroke? Heart disease, heart attack, and stroke become more likely as you age. This may be due, in part, to the hormonal changes that your body experiences during menopause. These can affect how your body processes dietary fats, triglycerides, and cholesterol. Heart attack and stroke are both medical  emergencies. There are many things that you can do to help prevent heart disease and stroke:  Have your blood pressure checked at least every 1-2 years. High blood pressure causes heart disease and increases the risk of stroke.  If you are 34-23 years old, ask your health care provider if you should take aspirin to prevent a heart attack or a stroke.  Do not use any tobacco products, including cigarettes, chewing tobacco, or electronic cigarettes. If you need help quitting, ask your health care provider.  It is important to eat a healthy diet and maintain a healthy weight. ? Be sure to include plenty of vegetables, fruits, low-fat dairy products, and lean protein. ? Avoid eating foods that are high in solid fats, added sugars, or salt (sodium).  Get regular exercise. This is one of the most important things that you can do for your health. ? Try to exercise for at least 150 minutes each week. The type of exercise that you do should increase your heart rate and make you sweat. This is known as moderate-intensity exercise. ? Try to do strengthening exercises at least twice each week. Do these in addition to the moderate-intensity exercise.  Know your numbers.Ask your health care provider to check your cholesterol and your blood glucose. Continue to have your blood tested as directed by your health care provider.  What should I know about cancer screening? There are several types of cancer. Take the following steps to reduce your risk and to catch any cancer development as early as possible. Breast Cancer  Practice breast self-awareness. ? This means understanding how your breasts normally appear and feel. ? It also means doing regular breast self-exams. Let  your health care provider know about any changes, no matter how small.  If you are 54 or older, have a clinician do a breast exam (clinical breast exam or CBE) every year. Depending on your age, family history, and medical history, it  may be recommended that you also have a yearly breast X-ray (mammogram).  If you have a family history of breast cancer, talk with your health care provider about genetic screening.  If you are at high risk for breast cancer, talk with your health care provider about having an MRI and a mammogram every year.  Breast cancer (BRCA) gene test is recommended for women who have family members with BRCA-related cancers. Results of the assessment will determine the need for genetic counseling and BRCA1 and for BRCA2 testing. BRCA-related cancers include these types: ? Breast. This occurs in males or females. ? Ovarian. ? Tubal. This may also be called fallopian tube cancer. ? Cancer of the abdominal or pelvic lining (peritoneal cancer). ? Prostate. ? Pancreatic.  Cervical, Uterine, and Ovarian Cancer Your health care provider may recommend that you be screened regularly for cancer of the pelvic organs. These include your ovaries, uterus, and vagina. This screening involves a pelvic exam, which includes checking for microscopic changes to the surface of your cervix (Pap test).  For women ages 21-65, health care providers may recommend a pelvic exam and a Pap test every three years. For women ages 17-65, they may recommend the Pap test and pelvic exam, combined with testing for human papilloma virus (HPV), every five years. Some types of HPV increase your risk of cervical cancer. Testing for HPV may also be done on women of any age who have unclear Pap test results.  Other health care providers may not recommend any screening for nonpregnant women who are considered low risk for pelvic cancer and have no symptoms. Ask your health care provider if a screening pelvic exam is right for you.  If you have had past treatment for cervical cancer or a condition that could lead to cancer, you need Pap tests and screening for cancer for at least 20 years after your treatment. If Pap tests have been discontinued  for you, your risk factors (such as having a new sexual partner) need to be reassessed to determine if you should start having screenings again. Some women have medical problems that increase the chance of getting cervical cancer. In these cases, your health care provider may recommend that you have screening and Pap tests more often.  If you have a family history of uterine cancer or ovarian cancer, talk with your health care provider about genetic screening.  If you have vaginal bleeding after reaching menopause, tell your health care provider.  There are currently no reliable tests available to screen for ovarian cancer.  Lung Cancer Lung cancer screening is recommended for adults 68-76 years old who are at high risk for lung cancer because of a history of smoking. A yearly low-dose CT scan of the lungs is recommended if you:  Currently smoke.  Have a history of at least 30 pack-years of smoking and you currently smoke or have quit within the past 15 years. A pack-year is smoking an average of one pack of cigarettes per day for one year.  Yearly screening should:  Continue until it has been 15 years since you quit.  Stop if you develop a health problem that would prevent you from having lung cancer treatment.  Colorectal Cancer  This type of  cancer can be detected and can often be prevented.  Routine colorectal cancer screening usually begins at age 49 and continues through age 61.  If you have risk factors for colon cancer, your health care provider may recommend that you be screened at an earlier age.  If you have a family history of colorectal cancer, talk with your health care provider about genetic screening.  Your health care provider may also recommend using home test kits to check for hidden blood in your stool.  A small camera at the end of a tube can be used to examine your colon directly (sigmoidoscopy or colonoscopy). This is done to check for the earliest forms of  colorectal cancer.  Direct examination of the colon should be repeated every 5-10 years until age 11. However, if early forms of precancerous polyps or small growths are found or if you have a family history or genetic risk for colorectal cancer, you may need to be screened more often.  Skin Cancer  Check your skin from head to toe regularly.  Monitor any moles. Be sure to tell your health care provider: ? About any new moles or changes in moles, especially if there is a change in a mole's shape or color. ? If you have a mole that is larger than the size of a pencil eraser.  If any of your family members has a history of skin cancer, especially at a young age, talk with your health care provider about genetic screening.  Always use sunscreen. Apply sunscreen liberally and repeatedly throughout the day.  Whenever you are outside, protect yourself by wearing long sleeves, pants, a wide-brimmed hat, and sunglasses.  What should I know about osteoporosis? Osteoporosis is a condition in which bone destruction happens more quickly than new bone creation. After menopause, you may be at an increased risk for osteoporosis. To help prevent osteoporosis or the bone fractures that can happen because of osteoporosis, the following is recommended:  If you are 110-65 years old, get at least 1,000 mg of calcium and at least 600 mg of vitamin D per day.  If you are older than age 17 but younger than age 66, get at least 1,200 mg of calcium and at least 600 mg of vitamin D per day.  If you are older than age 14, get at least 1,200 mg of calcium and at least 800 mg of vitamin D per day.  Smoking and excessive alcohol intake increase the risk of osteoporosis. Eat foods that are rich in calcium and vitamin D, and do weight-bearing exercises several times each week as directed by your health care provider. What should I know about how menopause affects my mental health? Depression may occur at any age, but it  is more common as you become older. Common symptoms of depression include:  Low or sad mood.  Changes in sleep patterns.  Changes in appetite or eating patterns.  Feeling an overall lack of motivation or enjoyment of activities that you previously enjoyed.  Frequent crying spells.  Talk with your health care provider if you think that you are experiencing depression. What should I know about immunizations? It is important that you get and maintain your immunizations. These include:  Tetanus, diphtheria, and pertussis (Tdap) booster vaccine.  Influenza every year before the flu season begins.  Pneumonia vaccine.  Shingles vaccine.  Your health care provider may also recommend other immunizations. This information is not intended to replace advice given to you by your health care  provider. Make sure you discuss any questions you have with your health care provider. Document Released: 04/10/2005 Document Revised: 09/06/2015 Document Reviewed: 11/20/2014 Elsevier Interactive Patient Education  2018 [employer].

## 2017-06-11 NOTE — Assessment & Plan Note (Signed)
Long discussion with patient and son regarding preventative screening for patient.  At this time time, patient politely declines mammogram, DEXA scan.  Clinical breast exam performed.  In the absence of any pelvic complaints today, defer pelvic exam.

## 2017-06-18 ENCOUNTER — Encounter: Payer: Self-pay | Admitting: Family

## 2017-06-21 ENCOUNTER — Encounter: Payer: Self-pay | Admitting: Family

## 2017-06-30 ENCOUNTER — Encounter: Payer: Self-pay | Admitting: Family

## 2017-06-30 ENCOUNTER — Telehealth: Payer: Self-pay | Admitting: Radiology

## 2017-06-30 NOTE — Telephone Encounter (Signed)
Spoke with son cancelled lab appointment due to recent labs.   He will contact pharmacy regarding blood pressure medication to see if her medication is on recall .

## 2017-06-30 NOTE — Telephone Encounter (Signed)
Spoke with son appointment cancelled

## 2017-06-30 NOTE — Telephone Encounter (Signed)
Pt coming in on Friday for labs, please place future orders. Thank you.

## 2017-06-30 NOTE — Telephone Encounter (Signed)
Call pt's son [name]  Please call for clarification.  I am a little unsure of patient is coming in for labs on Friday. We did labs 05/2017.   Please asked patient what labs they were referring to?  In addition, please ensure that patient has called pharmacy to be sure that her losartan blood pressure medication has not been recalled.  There have been many recalls based on manufacturer.  And I want to make sure that she is spoken with her pharmacist regarding her particular losartan.

## 2017-07-01 NOTE — Telephone Encounter (Signed)
Note: lab appt has not been cancelled. Pt is still on the lab scheduled for tomorrow (07/02/17 @ 10:30am).  Please cancel appt if no longer needed.  Thanks

## 2017-07-02 ENCOUNTER — Other Ambulatory Visit: Payer: Self-pay

## 2017-07-14 NOTE — Progress Notes (Signed)
Already in chart.

## 2017-08-16 ENCOUNTER — Ambulatory Visit: Payer: Self-pay | Admitting: Podiatry

## 2017-10-05 ENCOUNTER — Telehealth: Payer: Self-pay | Admitting: Family

## 2017-10-05 ENCOUNTER — Other Ambulatory Visit: Payer: Self-pay

## 2017-10-05 DIAGNOSIS — F419 Anxiety disorder, unspecified: Secondary | ICD-10-CM

## 2017-10-05 DIAGNOSIS — E785 Hyperlipidemia, unspecified: Secondary | ICD-10-CM

## 2017-10-05 MED ORDER — CITALOPRAM HYDROBROMIDE 20 MG PO TABS: 20.0000 mg | ORAL_TABLET | Freq: Every day | ORAL | 1 refills | Status: DC

## 2017-10-05 MED ORDER — EZETIMIBE 10 MG PO TABS: 10.0000 mg | ORAL_TABLET | Freq: Every day | ORAL | 1 refills | Status: DC

## 2017-10-05 MED ORDER — CITALOPRAM HYDROBROMIDE 10 MG PO TABS: 10.0000 mg | ORAL_TABLET | Freq: Every day | ORAL | 1 refills | Status: DC

## 2017-10-05 NOTE — Telephone Encounter (Unsigned)
Copied from [place] [phone]. Topic: Quick Communication - See Telephone Encounter >> Oct 05, 2017 12:34 PM Hewitt Shorts wrote: Pt son is calling to check on the status of the refill request for 5 mg celexa and 10 mg celexa and ezetimibe -Son states that she will be out of these meds today and that the pharmacy has requested refills on all her meds but she needs these three STAT   Best number to call is [phone]

## 2017-10-06 ENCOUNTER — Other Ambulatory Visit: Payer: Self-pay

## 2017-10-06 DIAGNOSIS — I10 Essential (primary) hypertension: Secondary | ICD-10-CM

## 2017-10-06 MED ORDER — LOSARTAN POTASSIUM-HCTZ 50-12.5 MG PO TABS: 1.0000 | ORAL_TABLET | Freq: Every day | ORAL | 1 refills | Status: DC

## 2017-10-06 MED ORDER — AMLODIPINE BESYLATE 5 MG PO TABS: 5.0000 mg | ORAL_TABLET | Freq: Every day | ORAL | 1 refills | Status: DC

## 2017-12-05 DIAGNOSIS — R112 Nausea with vomiting, unspecified: Secondary | ICD-10-CM | POA: Diagnosis not present

## 2017-12-05 DIAGNOSIS — E785 Hyperlipidemia, unspecified: Secondary | ICD-10-CM | POA: Diagnosis not present

## 2017-12-05 DIAGNOSIS — G309 Alzheimer's disease, unspecified: Secondary | ICD-10-CM | POA: Diagnosis not present

## 2017-12-05 DIAGNOSIS — I10 Essential (primary) hypertension: Secondary | ICD-10-CM | POA: Diagnosis not present

## 2017-12-05 DIAGNOSIS — R42 Dizziness and giddiness: Secondary | ICD-10-CM | POA: Diagnosis not present

## 2017-12-05 DIAGNOSIS — F0280 Dementia in other diseases classified elsewhere without behavioral disturbance: Secondary | ICD-10-CM | POA: Diagnosis not present

## 2017-12-06 DIAGNOSIS — H8110 Benign paroxysmal vertigo, unspecified ear: Secondary | ICD-10-CM | POA: Diagnosis not present

## 2017-12-06 DIAGNOSIS — E785 Hyperlipidemia, unspecified: Secondary | ICD-10-CM | POA: Diagnosis not present

## 2017-12-06 DIAGNOSIS — I10 Essential (primary) hypertension: Secondary | ICD-10-CM | POA: Diagnosis not present

## 2017-12-06 DIAGNOSIS — R55 Syncope and collapse: Secondary | ICD-10-CM | POA: Diagnosis not present

## 2017-12-06 DIAGNOSIS — R918 Other nonspecific abnormal finding of lung field: Secondary | ICD-10-CM | POA: Diagnosis not present

## 2017-12-06 DIAGNOSIS — R001 Bradycardia, unspecified: Secondary | ICD-10-CM | POA: Diagnosis not present

## 2017-12-06 DIAGNOSIS — I493 Ventricular premature depolarization: Secondary | ICD-10-CM | POA: Diagnosis not present

## 2017-12-06 DIAGNOSIS — R42 Dizziness and giddiness: Secondary | ICD-10-CM | POA: Diagnosis not present

## 2017-12-06 DIAGNOSIS — M6281 Muscle weakness (generalized): Secondary | ICD-10-CM | POA: Diagnosis not present

## 2017-12-06 DIAGNOSIS — R112 Nausea with vomiting, unspecified: Secondary | ICD-10-CM | POA: Diagnosis not present

## 2017-12-06 DIAGNOSIS — K811 Chronic cholecystitis: Secondary | ICD-10-CM | POA: Diagnosis not present

## 2017-12-06 DIAGNOSIS — R197 Diarrhea, unspecified: Secondary | ICD-10-CM | POA: Diagnosis not present

## 2017-12-06 DIAGNOSIS — S0990XA Unspecified injury of head, initial encounter: Secondary | ICD-10-CM | POA: Diagnosis not present

## 2017-12-06 DIAGNOSIS — H8111 Benign paroxysmal vertigo, right ear: Secondary | ICD-10-CM | POA: Diagnosis not present

## 2017-12-06 DIAGNOSIS — S0083XA Contusion of other part of head, initial encounter: Secondary | ICD-10-CM | POA: Diagnosis not present

## 2017-12-06 DIAGNOSIS — F329 Major depressive disorder, single episode, unspecified: Secondary | ICD-10-CM | POA: Diagnosis not present

## 2017-12-06 DIAGNOSIS — F0390 Unspecified dementia without behavioral disturbance: Secondary | ICD-10-CM | POA: Diagnosis not present

## 2017-12-07 ENCOUNTER — Encounter: Payer: Self-pay | Admitting: Family

## 2017-12-07 NOTE — Telephone Encounter (Signed)
Pt also wants to know if it would be ok to give his mother some Meclizine in the mean time for her vertigo?

## 2017-12-07 NOTE — Telephone Encounter (Signed)
Spoke with son [name] , he states he bought some OTC meclizine for his mother.  Advised that you were out of office today.

## 2017-12-07 NOTE — Telephone Encounter (Signed)
Pts son called back inquiring if NP Arnett has received orders for BBPV therapy for vertigo from [facility] and how pt needs to go about scheduling that therapy. Please advise.

## 2017-12-08 NOTE — Telephone Encounter (Signed)
Copied from [place] [phone]. Topic: Referral - Request >> Dec 08, 2017  1:47 PM Lennox Solders wrote: Reason for CRM: dale pt son is calling and his mother would like physical therapy for vertigo at [place] phone number [phone] and fax [phone]. Pt has [employer]

## 2017-12-08 NOTE — Telephone Encounter (Signed)
Patients son [name] called in and wanted to know if anyone has called in and spoke with Dr Vidal Schwalbe regarding therapy orders. He is requesting a call back.  Cb# [phone]

## 2017-12-08 NOTE — Telephone Encounter (Signed)
Appointment scheduled 12/13/17 to follow up with you

## 2017-12-10 NOTE — Telephone Encounter (Signed)
Orders for vestibular rehab placed in your folder for signature . Appointment on 12/13/17

## 2017-12-13 ENCOUNTER — Encounter: Payer: Self-pay | Admitting: Family

## 2017-12-13 ENCOUNTER — Ambulatory Visit (INDEPENDENT_AMBULATORY_CARE_PROVIDER_SITE_OTHER): Payer: Medicare HMO | Admitting: Family

## 2017-12-13 ENCOUNTER — Telehealth: Payer: Self-pay | Admitting: Family

## 2017-12-13 VITALS — BP 140/80 | HR 60 | Temp 97.7°F | Resp 15 | Ht 67.0 in | Wt 159.5 lb

## 2017-12-13 DIAGNOSIS — F325 Major depressive disorder, single episode, in full remission: Secondary | ICD-10-CM | POA: Insufficient documentation

## 2017-12-13 DIAGNOSIS — R42 Dizziness and giddiness: Secondary | ICD-10-CM

## 2017-12-13 DIAGNOSIS — I10 Essential (primary) hypertension: Secondary | ICD-10-CM | POA: Diagnosis not present

## 2017-12-13 DIAGNOSIS — E785 Hyperlipidemia, unspecified: Secondary | ICD-10-CM

## 2017-12-13 DIAGNOSIS — H8110 Benign paroxysmal vertigo, unspecified ear: Secondary | ICD-10-CM | POA: Diagnosis not present

## 2017-12-13 DIAGNOSIS — F0280 Dementia in other diseases classified elsewhere without behavioral disturbance: Secondary | ICD-10-CM | POA: Diagnosis not present

## 2017-12-13 DIAGNOSIS — G301 Alzheimer's disease with late onset: Secondary | ICD-10-CM

## 2017-12-13 DIAGNOSIS — Z23 Encounter for immunization: Secondary | ICD-10-CM | POA: Diagnosis not present

## 2017-12-13 DIAGNOSIS — F419 Anxiety disorder, unspecified: Secondary | ICD-10-CM | POA: Insufficient documentation

## 2017-12-13 DIAGNOSIS — F32A Depression, unspecified: Secondary | ICD-10-CM | POA: Insufficient documentation

## 2017-12-13 MED ORDER — LOSARTAN POTASSIUM 50 MG PO TABS: 50.0000 mg | ORAL_TABLET | Freq: Every day | ORAL | 3 refills | Status: DC

## 2017-12-13 MED ORDER — PRAVASTATIN SODIUM 20 MG PO TABS: 20.0000 mg | ORAL_TABLET | Freq: Every day | ORAL | 3 refills | Status: DC

## 2017-12-13 NOTE — Telephone Encounter (Signed)
[facility] and advised Renee that Hyzaar was discontinued and changed to losartan per Joycelyn Schmid.

## 2017-12-13 NOTE — Assessment & Plan Note (Addendum)
Reassuring neurologic exam. Reviewed notes from ED. Unable to see CT head however per discharge notes and son, this was done and negative for acute process. Declines ENT audilogy testing for now. Signed paperwork for vestibular rehab however I suspect orthostatis a main contributor. Vertigo symptom appears less prevalent. Will follow

## 2017-12-13 NOTE — Assessment & Plan Note (Addendum)
Appears safe at home with son at this time. No formal evaluation. Of note: no brain MRI has been done. Pending neurology referral.

## 2017-12-13 NOTE — Telephone Encounter (Signed)
Copied from [place] [phone]. Topic: Quick Communication - Rx Refill/Question >> Dec 13, 2017 10:09 AM Burchel, Abbi R wrote: Medication: losartan (COZAAR) 50 MG tablet, losartan-hydrochlorothiazide (HYZAAR) 50-12.5 MG tablet  Renee, [facility]) called to get confirmation that pt's BP med was supposed to be changed from [facility] to [facility].  Please call and verify.   [place], [place], [place] - [address] [place] [address] Phone: [phone] Fax: [phone]

## 2017-12-13 NOTE — Assessment & Plan Note (Signed)
Orthostatic. Pending trial dc of HCTZ. May continue amlodipine, losartan with consideration of increasing losartan to 100mg  qd if not less than 140/90. Discussed 140/90 as patient's target ( versus 120/80) due to age , history of dizziness, fall. Will follow.

## 2017-12-13 NOTE — Progress Notes (Signed)
Subjective:    Patient ID: Katherine Olson, female    DOB: [birth year], 84 y.o.   MRN: [id number]  CC: Katherine Olson is a 84 y.o. female who presents today for follow up.   HPI: Accompanied by son today  Dizziness has improved, which son has also agreed with. No dizziness this morning.   HLD- zetia not working.   Depression - on 30mg  celexa. Seems happy.   Dementia- has been on medications in the past , states mom was disoriented, dizzy. Mother still cooks, dials phone numbers. States she is forgetful however memory hasnt worsened. Feels like she is safe at home. Never wandered or left stove on.   HTN- compliant with hyzaar, amlodipine. BP at home 140/90. No CP.   Son states that CT head was done in [facility] ER ( I cannot see this).   No ear pain, fever, sinus congestion, cough   Over past couple of years, hearing has worsned in left ear. No ringing. Describes that can have room spinning. No more falls since 7 days ago, at that time 'room was spinning.' Walked to bathroom. Son has since put walker beside bed so that she uses bedside toilet. Patient lives with son. No tripping. Declines home PT and balance.   Complains of 'dizziness'  More often , with position changes. States will become 'woozy.'   Doesn't drink very much water. No leg swelling.   Per son, vertigo was right side as told in ER.   12/05/17 dizziness- considered vertigo Troponin negative Blood in urine hgb slight low Elevated BUN EKG shows premature beats,  12/06/17 vertigo, fall to bathroom  admitted 10/7, discharged 10/7 for BPPV Given zofran, taken through [facility] x 2 NO Mri brain Held hyzaar in setting of fall ORTHOSTATICS     HISTORY:  Past Medical History:  Diagnosis Date  . Depression   . Hyperlipidemia   . Hypertension    Past Surgical History:  Procedure Laterality Date  . ABDOMINAL HYSTERECTOMY     partial  . JOINT REPLACEMENT Right 1991   hip   Family History  Problem Relation Age  of Onset  . Alzheimer's disease Brother   . Heart disease Sister   . Cancer Sister        kidney  . Cancer Mother        liver  . Cancer Father        prostate    Allergies: Simvastatin Current Outpatient Medications on File Prior to Visit  Medication Sig Dispense Refill  . amLODipine (NORVASC) 5 MG tablet Take 1 tablet (5 mg total) by mouth daily. In am 90 tablet 1  . citalopram (CELEXA) 10 MG tablet Take 1 tablet (10 mg total) by mouth daily. Take with 20 mg in am to equal (30 mg total) per day 90 tablet 1  . citalopram (CELEXA) 20 MG tablet Take 1 tablet (20 mg total) by mouth daily. In am. With 10 mg to equal 30 mg daily in am 90 tablet 1  . Coenzyme Q-10 200 MG CAPS Take by mouth.    . ezetimibe (ZETIA) 10 MG tablet Take 1 tablet (10 mg total) by mouth daily. 90 tablet 1  . Multiple Vitamins-Minerals (MULTIVITAMIN WITH MINERALS) tablet Take 1 tablet by mouth daily.    . Omega-3 Fatty Acids (FISH OIL PO) Take by mouth.     No current facility-administered medications on file prior to visit.     Social History   Tobacco Use  .  Smoking status: Never Smoker  . Smokeless tobacco: Never Used  Substance Use Topics  . Alcohol use: No  . Drug use: No    Review of Systems  Constitutional: Negative for chills and fever.  HENT: Positive for hearing loss (left). Negative for congestion, sinus pressure, sore throat and tinnitus.   Respiratory: Negative for cough and shortness of breath.   Cardiovascular: Negative for chest pain, palpitations and leg swelling.  Gastrointestinal: Negative for nausea and vomiting.  Neurological: Positive for dizziness. Negative for syncope and headaches.  Psychiatric/Behavioral: Negative for confusion.      Objective:    BP 140/80 (BP Location: Left Arm, Patient Position: Sitting, Cuff Size: Normal)   Pulse 60   Temp 97.7 F (36.5 C) (Oral)   Resp 15   Ht 5\' 7"  (1.702 m)   Wt 159 lb 8 oz (72.3 kg)   SpO2 96%   BMI 24.98 kg/m  BP Readings  from Last 3 Encounters:  12/13/17 140/80  06/11/17 (!) 144/86  03/17/17 130/82   Wt Readings from Last 3 Encounters:  12/13/17 159 lb 8 oz (72.3 kg)  06/11/17 156 lb 8 oz (71 kg)  03/17/17 154 lb 4 oz (70 kg)    Physical Exam  Constitutional: She appears well-developed and well-nourished.  HENT:  Head: Normocephalic and atraumatic.  Right Ear: Hearing, tympanic membrane, external ear and ear canal normal. No drainage, swelling or tenderness. No foreign bodies. Tympanic membrane is not erythematous and not bulging. No middle ear effusion. No decreased hearing is noted.  Left Ear: Hearing, tympanic membrane, external ear and ear canal normal. No drainage, swelling or tenderness. No foreign bodies. Tympanic membrane is not erythematous and not bulging.  No middle ear effusion. No decreased hearing is noted.  Nose: Nose normal. No rhinorrhea. Right sinus exhibits no maxillary sinus tenderness and no frontal sinus tenderness. Left sinus exhibits no maxillary sinus tenderness and no frontal sinus tenderness.  Mouth/Throat: Uvula is midline, oropharynx is clear and moist and mucous membranes are normal. No oropharyngeal exudate, posterior oropharyngeal edema, posterior oropharyngeal erythema or tonsillar abscesses.  Eyes: Pupils are equal, round, and reactive to light. Conjunctivae and EOM are normal.  Fundus normal bilaterally.   Neck: Carotid bruit is not present.  Cardiovascular: Normal rate, regular rhythm, normal heart sounds and normal pulses.  No LE edema.   Pulmonary/Chest: Effort normal and breath sounds normal. She has no wheezes. She has no rhonchi. She has no rales.  Lymphadenopathy:       Head (right side): No submental, no submandibular, no tonsillar, no preauricular, no posterior auricular and no occipital adenopathy present.       Head (left side): No submental, no submandibular, no tonsillar, no preauricular, no posterior auricular and no occipital adenopathy present.    She has  no cervical adenopathy.  Neurological: She is alert. She has normal strength. No cranial nerve deficit or sensory deficit. She displays a negative Romberg sign.  Reflex Scores:      Bicep reflexes are 2+ on the right side and 2+ on the left side.      Patellar reflexes are 2+ on the right side and 2+ on the left side. Grip equal and strong bilateral upper extremities. No assistance device.  Gait slow and steady as I watched her walk across room and ascend table. She is cautious, careful, slow. Stated 'stiff' when getting out of chair. No dizziness while getting on table and laying down. . Able to perform rapid alternating movement  without difficulty.     Skin: Skin is warm and dry.  Psychiatric: She has a normal mood and affect. Her speech is normal and behavior is normal. Thought content normal.  Vitals reviewed.      Assessment & Plan:   Problem List Items Addressed This Visit      Cardiovascular and Mediastinum   Essential hypertension    Orthostatic. Pending trial dc of HCTZ. May continue amlodipine, losartan with consideration of increasing losartan to 100mg  qd if not less than 140/90. Discussed 140/90 as patient's target ( versus 120/80) due to age , history of dizziness, fall. Will follow.       Relevant Medications   pravastatin (PRAVACHOL) 20 MG tablet   losartan (COZAAR) 50 MG tablet     Nervous and Auditory   Alzheimer's disease ([place])    Appears safe at home with son at this time. No formal evaluation. Of note: no brain MRI has been done. Pending neurology referral.       Relevant Orders   Ambulatory referral to Neurology   BPPV (benign paroxysmal positional vertigo)    Reassuring neurologic exam. Reviewed notes from ED. Unable to see CT head however per discharge notes and son, this was done and negative for acute process. Declines ENT audilogy testing for now. Signed paperwork for vestibular rehab however I suspect orthostatis a main contributor. Vertigo symptom  appears less prevalent. Will follow         Other   Hyperlipidemia    Trial of once weekly statin. Will stay on zetia for now.       Relevant Medications   pravastatin (PRAVACHOL) 20 MG tablet   losartan (COZAAR) 50 MG tablet   Depression, major, single episode, complete remission (HCC)    Stable. Will continue current regimen.        Other Visit Diagnoses    Dizziness    -  Primary   Relevant Orders   US Carotid Duplex Bilateral       I have discontinued [employer] "Concetta Lubin"'s DOCOSAHEXAENOIC ACID PO and losartan-hydrochlorothiazide. I am also having her start on pravastatin and losartan. Additionally, I am having her maintain her multivitamin with minerals, Omega-3 Fatty Acids (FISH OIL PO), Coenzyme Q-10, citalopram, citalopram, ezetimibe, and amLODipine.   Meds ordered this encounter  Medications  . pravastatin (PRAVACHOL) 20 MG tablet    Sig: Take 1 tablet (20 mg total) by mouth daily.    Dispense:  90 tablet    Refill:  3    Order Specific Question:   Supervising Provider    Answer:   Deborra Medina L [[id number]]  . losartan (COZAAR) 50 MG tablet    Sig: Take 1 tablet (50 mg total) by mouth daily.    Dispense:  90 tablet    Refill:  3    Order Specific Question:   Supervising Provider    Answer:   Crecencio Mc [[id number]]    Return precautions given.   Risks, benefits, and alternatives of the medications and treatment plan prescribed today were discussed, and patient expressed understanding.   Education regarding symptom management and diagnosis given to patient on AVS.  Continue to follow with Burnard Hawthorne, FNP for routine health maintenance.   Katherine Olson and I agreed with plan.   Mable Paris, FNP

## 2017-12-13 NOTE — Assessment & Plan Note (Signed)
Trial of once weekly statin. Will stay on zetia for now.

## 2017-12-13 NOTE — Addendum Note (Signed)
Addended by: Johna Sheriff on: 12/13/2017 10:38 AM   Modules accepted: Orders

## 2017-12-13 NOTE — Telephone Encounter (Signed)
This is correct  I dced the HCTZ component due to dizziness

## 2017-12-13 NOTE — Patient Instructions (Addendum)
Encourage more water.   Shingrex at local pharmacy.   Start pravastatin once per week  You do look orthostatic when you change positions. Very important to be careful with position changes.   Trial stop of HCTZ however we must watch blood pressure very carefully  You may stay on amlodipine 5mg  once daily and now losartan 50mg  once per day. If blood pressure not well controlled, we can increase the losartan to 100mg  once per day - we will need to check her labs 5 days after making this change however so please do not make this change without letting me know.   Monitor blood pressure,  Goal is less than 140/80 based on her age; if persistently higher, please make sooner follow up appointment so we can recheck you blood pressure and manage medications   Today we discussed referrals, orders. Neurology, ultrasound of carotid   I have placed these orders in the system for you.  Please be sure to give [place] a call if you have not heard from our office regarding this. We should hear from [place] within ONE week with information regarding your appointment. If not, please let me know immediately.         Orthostatic Hypotension Orthostatic hypotension is a sudden drop in blood pressure that happens when you quickly change positions, such as when you get up from a seated or lying position. Blood pressure is a measurement of how strongly, or weakly, your blood is pressing against the walls of your arteries. Arteries are blood vessels that carry blood from your heart throughout your body. When blood pressure is too low, you may not get enough blood to your brain or to the rest of your organs. This can cause weakness, light-headedness, rapid heartbeat, and fainting. This can last for just a few seconds or for up to a few minutes. Orthostatic hypotension is usually not a serious problem. However, if it happens frequently or gets worse, it may be a sign of something more serious. What are the causes? This  condition may be caused by:  Sudden changes in posture, such as standing up quickly after you have been sitting or lying down.  Blood loss.  Loss of body fluids (dehydration).  Heart problems.  Hormone (endocrine) problems.  Pregnancy.  Severe infection.  Lack of certain nutrients.  Severe allergic reactions (anaphylaxis).  Certain medicines, such as blood pressure medicine or medicines that make the body lose excess fluids (diuretics). Sometimes, this condition can be caused by not taking medicine as directed, such as taking too much of a certain medicine.  What increases the risk? Certain factors can make you more likely to develop orthostatic hypotension, including:  Age. Risk increases as you get older.  Conditions that affect the heart or the central nervous system.  Taking certain medicines, such as blood pressure medicine or diuretics.  Being pregnant.  What are the signs or symptoms? Symptoms of this condition may include:  Weakness.  Light-headedness.  Dizziness.  Blurred vision.  Fatigue.  Rapid heartbeat.  Fainting, in severe cases.  How is this diagnosed? This condition is diagnosed based on:  Your medical history.  Your symptoms.  Your blood pressure measurement. Your health care provider will check your blood pressure when you are: ? Lying down. ? Sitting. ? Standing.  A blood pressure reading is recorded as two numbers, such as "120 over 80" (or 120/80). The first ("top") number is called the systolic pressure. It is a measure of the pressure in  your arteries as your heart beats. The second ("bottom") number is called the diastolic pressure. It is a measure of the pressure in your arteries when your heart relaxes between beats. Blood pressure is measured in a unit called mm Hg. Healthy blood pressure for adults is 120/80. If your blood pressure is below 90/60, you may be diagnosed with hypotension. Other information or tests that may be  used to diagnose orthostatic hypotension include:  Your other vital signs, such as your heart rate and temperature.  Blood tests.  Tilt table test. For this test, you will be safely secured to a table that moves you from a lying position to an upright position. Your heart rhythm and blood pressure will be monitored during the test.  How is this treated? Treatment for this condition may include:  Changing your diet. This may involve eating more salt (sodium) or drinking more water.  Taking medicines to raise your blood pressure.  Changing the dosage of certain medicines you are taking that might be lowering your blood pressure.  Wearing compression stockings. These stockings help to prevent blood clots and reduce swelling in your legs.  In some cases, you may need to go to the hospital for:  Fluid replacement. This means you will receive fluids through an IV tube.  Blood replacement. This means you will receive donated blood through an IV tube (transfusion).  Treating an infection or heart problems, if this applies.  Monitoring. You may need to be monitored while medicines that you are taking wear off.  Follow these instructions at home: Eating and drinking   Drink enough fluid to keep your urine clear or pale yellow.  Eat a healthy diet and follow instructions from your health care provider about eating or drinking restrictions. A healthy diet includes: ? Fresh fruits and vegetables. ? Whole grains. ? Lean meats. ? Low-fat dairy products.  Eat extra salt only as directed. Do not add extra salt to your diet unless your health care provider told you to do that.  Eat frequent, small meals.  Avoid standing up suddenly after eating. Medicines  Take over-the-counter and prescription medicines only as told by your health care provider. ? Follow instructions from your health care provider about changing the dosage of your current medicines, if this applies. ? Do not stop or  adjust any of your medicines on your own. General instructions  Wear compression stockings as told by your health care provider.  Get up slowly from lying down or sitting positions. This gives your blood pressure a chance to adjust.  Avoid hot showers and excessive heat as directed by your health care provider.  Return to your normal activities as told by your health care provider. Ask your health care provider what activities are safe for you.  Do not use any products that contain nicotine or tobacco, such as cigarettes and e-cigarettes. If you need help quitting, ask your health care provider.  Keep all follow-up visits as told by your health care provider. This is important. Contact a health care provider if:  You vomit.  You have diarrhea.  You have a fever for more than 2-3 days.  You feel more thirsty than usual.  You feel weak and tired. Get help right away if:  You have chest pain.  You have a fast or irregular heartbeat.  You develop numbness in any part of your body.  You cannot move your arms or your legs.  You have trouble speaking.  You become sweaty  or feel lightheaded.  You faint.  You feel short of breath.  You have trouble staying awake.  You feel confused. This information is not intended to replace advice given to you by your health care provider. Make sure you discuss any questions you have with your health care provider. Document Released: 02/06/2002 Document Revised: 11/05/2015 Document Reviewed: 08/09/2015 Elsevier Interactive Patient Education  2018 [employer].

## 2017-12-13 NOTE — Assessment & Plan Note (Addendum)
Stable. Will continue current regimen.

## 2017-12-14 ENCOUNTER — Encounter: Payer: Self-pay | Admitting: Family

## 2017-12-20 ENCOUNTER — Other Ambulatory Visit: Payer: Self-pay

## 2017-12-22 ENCOUNTER — Encounter: Payer: Self-pay | Admitting: Family

## 2017-12-22 ENCOUNTER — Telehealth: Payer: Self-pay | Admitting: Family

## 2017-12-22 NOTE — Telephone Encounter (Signed)
Sent mychart

## 2017-12-22 NOTE — Telephone Encounter (Signed)
FYI, I called pt to schedule pt for her [place] order that was for 12/13/17. Pt son stated that pt doesnot need to the [place] being that pt had tests done at [facility] which came back stating that pt had vertigo per pt son. Please advise and Thank you!

## 2017-12-28 ENCOUNTER — Other Ambulatory Visit: Payer: Self-pay

## 2017-12-28 ENCOUNTER — Encounter: Payer: Self-pay | Admitting: Physical Therapy

## 2017-12-28 ENCOUNTER — Ambulatory Visit: Payer: Medicare HMO | Attending: Family | Admitting: Physical Therapy

## 2017-12-28 DIAGNOSIS — R42 Dizziness and giddiness: Secondary | ICD-10-CM

## 2017-12-28 NOTE — Therapy (Signed)
[place] MAIN [facility] SERVICES [address] [place], [place], [address] Phone: [phone]   Fax:  [phone]  Physical Therapy Evaluation  Patient Details  Name: Katherine Olson MRN: [id number] Date of Birth: 09-02-1926 Referring Provider (PT): Dr. Nena Polio   Encounter Date: 12/28/2017  PT End of Session - 12/30/17 1749    Visit Number  1    Number of Visits  9    Date for PT Re-Evaluation  02/22/18    Authorization Type  1/10    PT Start Time  1255    PT Stop Time  1415    PT Time Calculation (min)  80 min    Equipment Utilized During Treatment  Gait belt    Activity Tolerance  Patient tolerated treatment well    Behavior During Therapy  [facility] for tasks assessed/performed       Past Medical History:  Diagnosis Date  . Depression   . Hyperlipidemia   . Hypertension     Past Surgical History:  Procedure Laterality Date  . ABDOMINAL HYSTERECTOMY     partial  . JOINT REPLACEMENT Right 1991   hip    There were no vitals filed for this visit.   Subjective Assessment - 12/31/17 0832    Subjective  Patient reports that she had a little dizziness this morning.     Pertinent History  Patient and her son report that she first started getting dizziness a few months ago and it got bad on 12/05/2017. Patient seen at the ED on 106/2019 due to acute onset vertigo with nausea and vomiting and felt faint upon standing. Patient returned home and on 12/06/2017 patient got up to use the restroom at 3 am and became vertiginous, lightheaded and suffered an unwitnessed fall in her bathroom. Patient's son called EMS and she returned to the ED where it was determined that she had BPPV as leftward beating nystagmus with rightward gaze and positive right Dix-Hallpike test were observed in the ED. Patient was treated by physical therapy with two Epley maneuvers. Head CT revealed no acute intracranial abnormalities per MR. Patient returned home. Patient has had  dizziness almost every day until about 4 days ago, but reports it has not been as severe. Patient reports vertigo, "whoozy" and unsteadiness. Patient reports sitting down, getting up, quick movements, looking up, bending over. Patient reports sitting still and staying calm helps the dizziness go away. Patient lives with her son. Patient uses the rolling walker in the yard and inside at times like when her back is hurting. Patient ambulates in the community without AD and without holding onto her son's arm until this most recent episode of dizziness.     Diagnostic tests  brain MRI 12/06/2017 revealed No acute intracranial abnormality per mr.     Patient Stated Goals  Patient would like to have reduced dizziness when she moves her head and bends over.     Currently in Pain?  No/denies         [facility] PT Assessment - 12/30/17 1751      Assessment   Medical Diagnosis  vertigo and BPPV    Referring Provider (PT)  Dr. Nena Polio    Prior Therapy  none      Precautions   Precautions  Fall      Restrictions   Weight Bearing Restrictions  No      Home Environment   Living Environment  Private residence    Available Help at Discharge  Family   son   Type of [place] to enter    Entrance Stairs-Number of Steps  2    Entrance Stairs-Rails  Left;Right;Can reach both    Home Layout  One level    [place] - 2 wheels;Tub bench;Bedside commode      Prior Function   Level of Independence  Independent with community mobility without device      Cognition   Overall Cognitive Status  --   Alzheimers disease per MR     Standardized Balance Assessment   Standardized Balance Assessment  Dynamic Gait Index      Dynamic Gait Index   Level Surface  Mild Impairment    Change in Gait Speed  Moderate Impairment    Gait with Horizontal Head Turns  Normal    Gait with Vertical Head Turns  Normal    Gait and Pivot Turn  Mild Impairment    Step Over Obstacle  Mild  Impairment    Step Around Obstacles  Normal    Steps  Moderate Impairment    Total Score  17          VESTIBULAR AND BALANCE EVALUATION  Subjective history of current problem:  Patient and her son report that she first started getting dizziness a few months ago and it got bad on 12/05/2017. Patient seen at the ED on 106/2019 due to acute onset vertigo with nausea and vomiting and felt faint upon standing. Patient returned home and on 12/06/2017 patient got up to use the restroom at 3 am and became vertiginous, lightheaded and suffered an unwitnessed fall in her bathroom. Patient's son called EMS and she returned to the ED where it was determined that she had BPPV as leftward beating nystagmus with rightward gaze and positive right Dix-Hallpike test were observed in the ED. Patient was treated by physical therapy with two Epley maneuvers. Head CT revealed no acute intracranial abnormalities per MR. Patient returned home. Patient has had dizziness almost every day until about 4 days ago, but reports it has not been as severe. Patient reports vertigo, "whoozy" and unsteadiness. Patient reports sitting down, getting up, quick movements, looking up, bending over. Patient reports sitting still and staying calm helps the dizziness go away. Patient lives with her son. Patient uses the rolling walker in the yard and inside at times like when her back is hurting. Patient ambulates in the community without AD and without holding onto her son's arm until this most recent episode of dizziness.   Description of dizziness: vertigo, unsteadiness, lightheadedness, general unsteadiness, whooziness Frequency: Patient has not had dizziness in past 4 days until this morning she had dizziness that lasted about a minute.  Duration: minutes  Symptom nature: motion provoked, positional, variable, intermittent Progression of symptoms: better History of similar episodes: a long time ago complained of whooziness  Falls  (yes/no): yes  Number of falls in past 6 months: 1, no near misses   Auditory complaints (tinnitus, pain, drainage): denies  Vision (last eye exam, diplopia, recent changes): last eye exam was about year ago, recommended getting an annual exam; denies recent changes  Current Symptoms: (dysarthria, dysphagia, drop attacks, bowel and bladder changes, recent weight loss/gain) Review of systems negative for red flags.   EXAMINATION   SOMATOSENSORY:  Sensation Intact Diminished Absent  Light touch Normal UEs and LEs    Any N & T in extremities or weakness: denies   COORDINATION:  Finger to Nose: Normal  Past Pointing: Normal   MUSCULOSKELETAL SCREEN:  Cervical Spine ROM: Cervical AROM WNL without pain   Gait: Patient arrives ambulating without AD, but with support of her son's arm initially.  Patient ambulates with slow cadence with decreased step length.   Balance:  Patient is challenged by single leg stance, difficulty changing gait speed and activities with head turns.   OCULOMOTOR / VESTIBULAR TESTING:  Oculomotor Exam- Room Light   Normal Abnormal Comments  Ocular Alignment N    Ocular ROM N    Spontaneous Nystagmus N    End-Gaze Nystagmus N    Smooth Pursuit N    Saccades  Abn Left field hypometric saccades noted; denies dizziness  VOR N    VOR Cancellation N    Left Head Thrust N    Right Head Thrust N     BPPV TESTS:   Symptoms Duration Intensity Nystagmus  L Dix-Hallpike None   None observed  R Dix-Hallpike None   None observed  R Sidelying test None   None observed   FUNCTIONAL OUTCOME MEASURES:  Results Comments  DHI 14/100 low perception of handicap; in need of intervention  DGI 17/24 Falls risk; in need of intervention    Neuromuscular Re-education:  Firm Surface:  On firm surface, patient performed feet together progressions and semi-tandem progressions with alternating lead leg with and without horizontal and vertical head turns.  Issued for HEP.  Discussed safety with HEP.    PT Education - 12/28/17 1252    Education Details  Reviewed Plan of care, issued standing feet together and semi-tandem stance progressions with head turns   Person(s) Educated  Patient and her son   Methods  Explanation, demonstrated, verbal cuing, hand out   Comprehension  Demonstrated understanding       PT Short Term Goals - 12/30/17 1750      PT SHORT TERM GOAL #1   Title  Patient will be able to perform home program with assistance of her family for self-management.    Time  4    Period  Weeks    Status  New    Target Date  01/25/18        PT Long Term Goals - 12/30/17 1750      PT LONG TERM GOAL #1   Title  Patient will demonstrate reduced falls risk as evidenced by Dynamic Gait Index (DGI) 20/24 or greater.    Baseline  scored 17/24 on 12/28/2017    Time  8    Period  Weeks    Status  New    Target Date  02/22/18      PT LONG TERM GOAL #2   Title  Patient reports no vertigo with provoking motions or positions.    Time  8    Period  Weeks    Status  New    Target Date  02/22/18      PT LONG TERM GOAL #3   Title  Patient will report 50% or greater improvement in her symptoms of dizziness and imbalance with provoking motions or positions by 02/22/2018 to improve her quality of life.     Time  8    Period  Weeks    Status  New    Target Date  02/22/18             Plan - 12/30/17 1749    Clinical Impression Statement  Per MR, patient had BPPV which was treated with two  Epley maneuvers in the ED on 12/06/2017. Patient reports that she has had improvements in her symptoms since that time but that she is still experiencing some dizziness and imbalance. Patient with negative Dix-Hallpike testing this date, but she is having sensitivity with activities with head turns and bending. Will plan on repeating Dix-hallpike tests next session for confirmatio of clearance of particles. Patient would benefit from PT services to address her  symptoms and deficits and to help decrease her falls risk.     History and Personal Factors relevant to plan of care:  age, comorbidities-HTN, Alzheimer's Disease    Clinical Presentation  Evolving    Clinical Decision Making  Low    Rehab Potential  Good    PT Frequency  1x / week    PT Duration  8 weeks    PT Treatment/Interventions  Vestibular;Canalith Repostioning;Gait training;Stair training;Therapeutic exercise;Balance training;Neuromuscular re-education;Patient/family education    PT Next Visit Plan  retest DIx-Hallpike tests. Review HEP, try VOR in standing    PT Home Exercise Plan  seni-tandem and feet together progressions with horizontal and vertical head turns on firm surface    Consulted and Agree with Plan of Care  Patient       Patient will benefit from skilled therapeutic intervention in order to improve the following deficits and impairments:     Visit Diagnosis: Dizziness and giddiness     Problem List Patient Active Problem List   Diagnosis Date Noted  . BPPV (benign paroxysmal positional vertigo) 12/13/2017  . Depression, major, single episode, complete remission ([place]) 12/13/2017  . Osteopenia 03/17/2017  . Nausea & vomiting 08/02/2016  . Atrophic vaginitis 08/08/2015  . Vulvar cysts 08/06/2015  . Routine physical examination 08/19/2014  . Cataract 04/06/2014  . Alzheimer's disease ([place]) 04/06/2014  . Essential hypertension 04/06/2014  . Urge incontinence 11/02/2013  . Memory loss 11/02/2013  . Hyperlipidemia    Lady Deutscher PT, DPT [phone] Lady Deutscher 12/28/2017, 12:56 PM  [place] MAIN [facility] SERVICES [address] [place], [place], [address] Phone: [phone]   Fax:  [phone]  Name: RAVENNE WAYMENT MRN: [id number] Date of Birth: Sep 15, 1926

## 2018-01-02 ENCOUNTER — Encounter: Payer: Self-pay | Admitting: Family

## 2018-01-04 ENCOUNTER — Ambulatory Visit: Payer: Medicare HMO | Attending: Family | Admitting: Physical Therapy

## 2018-01-04 ENCOUNTER — Encounter: Payer: Self-pay | Admitting: Physical Therapy

## 2018-01-04 DIAGNOSIS — R42 Dizziness and giddiness: Secondary | ICD-10-CM | POA: Insufficient documentation

## 2018-01-04 NOTE — Therapy (Signed)
[place] MAIN [facility] SERVICES [address] [place], [place], [address] Phone: [phone]   Fax:  [phone]  Physical Therapy Treatment  Patient Details  Name: Katherine Olson MRN: [id number] Date of Birth: September 19, 1926 Referring Provider (PT): Dr. Nena Polio   Encounter Date: 01/04/2018  PT End of Session - 01/04/18 1052    Visit Number  2    Number of Visits  9    Date for PT Re-Evaluation  02/22/18    Authorization Type  2/10    PT Start Time  1055    PT Stop Time  1153    PT Time Calculation (min)  58 min    Equipment Utilized During Treatment  Gait belt    Activity Tolerance  Patient tolerated treatment well    Behavior During Therapy  [facility] for tasks assessed/performed       Past Medical History:  Diagnosis Date  . Depression   . Hyperlipidemia   . Hypertension     Past Surgical History:  Procedure Laterality Date  . ABDOMINAL HYSTERECTOMY     partial  . JOINT REPLACEMENT Right 1991   hip    There were no vitals filed for this visit.  Subjective Assessment - 01/04/18 1051    Subjective  Patient reports that the dizziness is not that bad if I take my time. Patient states she has not had a spell since she was last seen in the clinic. Patient reports no vertigo. Patient said she was out in the yard this past week without difficulty, but states she did not do any stooping over. Patient states she has not had any dizziness since last session.    Pertinent History  Patient and her son report that she first started getting dizziness a few months ago and it got bad on 12/05/2017. Patient seen at the ED on 106/2019 due to acute onset vertigo with nausea and vomiting and felt faint upon standing. Patient returned home and on 12/06/2017 patient got up to use the restroom at 3 am and became vertiginous, lightheaded and suffered an unwitnessed fall in her bathroom. Patient's son called EMS and she returned to the ED where it was determined that  she had BPPV as leftward beating nystagmus with rightward gaze and positive right Dix-Hallpike test were observed in the ED. Patient was treated by physical therapy with two Epley maneuvers. Head CT revealed no acute intracranial abnormalities per MR. Patient returned home. Patient has had dizziness almost every day until about 4 days ago, but reports it has not been as severe. Patient reports vertigo, "whoozy" and unsteadiness. Patient reports sitting down, getting up, quick movements, looking up, bending over. Patient reports sitting still and staying calm helps the dizziness go away. Patient lives with her son. Patient uses the rolling walker in the yard and inside at times Olson when her back is hurting. Patient ambulates in the community without AD and without holding onto her son's arm until this most recent episode of dizziness.     Diagnostic tests  brain MRI 12/06/2017 revealed No acute intracranial abnormality per mr.     Patient Stated Goals  Patient would Olson to have reduced dizziness when she moves her head and bends over.     Currently in Pain?  Yes    Pain Score  --   "not a lot but I can tell"   Pain Location  [place]    Pain Orientation  Upper    Pain Descriptors /  Indicators  Sore   stiff   Pain Type  Chronic pain        Neuromuscular Re-education:  Dix-Hallpike: On inverted mat table with assistance of rehab technician, performed left and right Dix-Hallpike tests and both were negative with patient denying vertigo and no nystagmus observed.  Orthostatic Strategies: Orthostatic blood pressure readings were taken this date as follows: Supine 176/ 87 mmHg  Sitting  154/88 mm Hg  Standing 137/ 92 mmHg   Patient with 39 mmHg systolic pressure drop with transitioning from sup to sitting. This could potentially be contributing the patient's complaints of "wooziness" when getting up quickly and walking.  Patient reports episodes of "wooziness" when moving quickly during  transitional movements such as sit to stand. Discussed and demonstrated strategies to try to help minimize dizziness from position changes including maintaining adequate hydration, moving more slowly when transitioning and before standing performing sitting marches or LAQ and upon standing performing side to side sways or marching in place. Patient performed 10 reps seated hip flexion and 10 reps seated LAQ. Patient with history of Alzheimer's disease and when asked to repeat back the above, patient was unable to recall any of the strategies or exercises. Re-educated patient and her son and [employer] handout provided as well as emailed this information to her son.  Patient reports she does not use an assistive device when she gets up to use the restroom at night. Patient's son reports that they have a BSC at bedside. Encouraged patient to use BSC at night for safety.    Non-compliant/ Firm Surface: Reviewed HEP. Patient unable to recall her exercises. On firm surface, patient performed feet together progressions (progressed to feet together) and semi-tandem progressions with alternating lead leg with and without horizontal and vertical head turns multiple 30 second reps of each with verbal cuing for technique and foot placement.  Patient reports mild increase in dizziness/ "woozy" with these activities.     PT Education - 01/04/18 1051    Education Details  reviewed HEP and discussed strategies to help reduce dizziness with transitional changes as patient had a drop in systolic blood pressure of greater than 20 mmHg with orthostatic blood pressures this date.     Person(s) Educated  Patient;Child(ren)   son, who is patient's caregiver   Methods  Explanation;Demonstration;Handout;Verbal cues    Comprehension  Verbalized understanding;Returned demonstration;Verbal cues required       PT Short Term Goals - 12/30/17 1750      PT SHORT TERM GOAL #1   Title  Patient will be able to perform home  program with assistance of her family for self-management.    Time  4    Period  Weeks    Status  New    Target Date  01/25/18        PT Long Term Goals - 12/30/17 1750      PT LONG TERM GOAL #1   Title  Patient will demonstrate reduced falls risk as evidenced by Dynamic Gait Index (DGI) 20/24 or greater.    Baseline  scored 17/24 on 12/28/2017    Time  8    Period  Weeks    Status  New    Target Date  02/22/18      PT LONG TERM GOAL #2   Title  Patient reports no vertigo with provoking motions or positions.    Time  8    Period  Weeks    Status  New    Target Date  02/22/18      PT LONG TERM GOAL #3   Title  Patient will report 50% or greater improvement in her symptoms of dizziness and imbalance with provoking motions or positions by 02/22/2018 to improve her quality of life.     Time  8    Period  Weeks    Status  New    Target Date  02/22/18            Plan - 01/04/18 1052    Clinical Impression Statement  Patient reports that she has not had any further episodes of vertigo but patient's son reports that patient had one episode of "wooziness" lasting about 1 minute this past week, but no further episodes of vertigo. Patient reports that she has not done her home exercise program. Reinforced, reviewed and re-educated as to HEP. Patient with negative bilateral Dix-Hallpike tests this date with no nystagmus and patient denied vertigo. Assess orthostatic blood pressure readings this date, patient had a drop of 39 mmHg in systolic pressure when transitioning from sup to standing with patient reporting mild reproduction of "woozy" feeling upon standing. Discussed and demonstrated strategies to try to help minimize dizziness from position changes including maintaining adequate hydration, moving more slowly during transitional movements and performing seated marches and standing stepping in place prior to getting up and reviewed this information with patient and her son.  Encouraged patient to try to do her home exercise program. Patient would benefit from continued PT services to try to further address goals as set on plan of care.     Rehab Potential  Good    PT Frequency  1x / week    PT Duration  8 weeks    PT Treatment/Interventions  Vestibular;Canalith Repostioning;Gait training;Stair training;Therapeutic exercise;Balance training;Neuromuscular re-education;Patient/family education    PT Next Visit Plan  Review HEP, try VOR in standing    PT Home Exercise Plan  seni-tandem and feet together progressions with horizontal and vertical head turns on firm surface    Consulted and Agree with Plan of Care  Patient       Patient will benefit from skilled therapeutic intervention in order to improve the following deficits and impairments:  Decreased balance, Difficulty walking, Dizziness  Visit Diagnosis: Dizziness and giddiness     Problem List Patient Active Problem List   Diagnosis Date Noted  . BPPV (benign paroxysmal positional vertigo) 12/13/2017  . Depression, major, single episode, complete remission ([place]) 12/13/2017  . Osteopenia 03/17/2017  . Nausea & vomiting 08/02/2016  . Atrophic vaginitis 08/08/2015  . Vulvar cysts 08/06/2015  . Routine physical examination 08/19/2014  . Cataract 04/06/2014  . Alzheimer's disease ([place]) 04/06/2014  . Essential hypertension 04/06/2014  . Urge incontinence 11/02/2013  . Memory loss 11/02/2013  . Hyperlipidemia    Lady Deutscher PT, DPT [phone] Lady Deutscher 01/05/2018, 11:08 AM  [place] MAIN [facility] SERVICES [address] [place], [place], [address] Phone: [phone]   Fax:  [phone]  Name: MASHAL SLAVICK MRN: [id number] Date of Birth: 11-16-1926

## 2018-01-04 NOTE — Patient Instructions (Signed)
Access Code: [email or web address]  URL: [email or web address]  Date: 01/04/2018  Prepared by: Lady Deutscher   Exercises  Seated March - 10 reps - 1 sets - 1x daily - 7x weekly  Supine Bridge - 10 reps - 3 sets - 1x daily - 7x weekly  Supine Bridge - 10 reps - 3 sets - 1x daily - 7x weekly  Supine Bridge - 10 reps - 3 sets - 1x daily - 7x weekly  Patient Education  Postural Hypotension

## 2018-01-07 ENCOUNTER — Telehealth: Payer: Self-pay

## 2018-01-07 ENCOUNTER — Encounter: Payer: Self-pay | Admitting: Family

## 2018-01-07 NOTE — Telephone Encounter (Signed)
Copied from [place] [phone]. Topic: General - Inquiry >> Jan 07, 2018  7:34 AM Conception Chancy, NT wrote: Reason for CRM: patient son is calling and states he is waiting for Melissa to contact him to schedule his mothers ultrasound.    Please advise?

## 2018-01-07 NOTE — Telephone Encounter (Signed)
[name], patient's son calling to relay he has scheduled the [place] himself and it's on 01/11/2018 @10am . No need to call him back.

## 2018-01-07 NOTE — Telephone Encounter (Signed)
Just a FYI

## 2018-01-11 ENCOUNTER — Ambulatory Visit: Payer: Medicare HMO | Admitting: Physical Therapy

## 2018-01-11 ENCOUNTER — Ambulatory Visit
Admission: RE | Admit: 2018-01-11 | Discharge: 2018-01-11 | Disposition: A | Discharge status: Home / Self Care | Payer: Medicare HMO | Source: Ambulatory Visit | Attending: Family | Admitting: Family

## 2018-01-11 ENCOUNTER — Encounter: Payer: Self-pay | Admitting: Physical Therapy

## 2018-01-11 ENCOUNTER — Encounter: Payer: Self-pay | Admitting: Family

## 2018-01-11 DIAGNOSIS — I6523 Occlusion and stenosis of bilateral carotid arteries: Secondary | ICD-10-CM | POA: Insufficient documentation

## 2018-01-11 DIAGNOSIS — R42 Dizziness and giddiness: Secondary | ICD-10-CM

## 2018-01-11 NOTE — Therapy (Signed)
[place] MAIN [facility] SERVICES [address] [place], [place], [address] Phone: [phone]   Fax:  [phone]  Physical Therapy Treatment/Discharge Summary   Visits from Start of Care: 3 Dates of Service: 12/28/2017-01/11/2018   Patient Details  Name: Katherine Olson MRN: [id number] Date of Birth: 02/23/1927 Referring Provider (PT): Dr. Nena Polio   Encounter Date: 01/11/2018  PT End of Session - 01/11/18 1039    Visit Number  3    Number of Visits  9    Date for PT Re-Evaluation  02/22/18    Authorization Type  3/10    PT Start Time  1039    PT Stop Time  1120  (Pended)     PT Time Calculation (min)  41 min  (Pended)     Equipment Utilized During Treatment  Gait belt    Activity Tolerance  Patient tolerated treatment well    Behavior During Therapy  [facility] for tasks assessed/performed       Past Medical History:  Diagnosis Date  . Depression   . Hyperlipidemia   . Hypertension     Past Surgical History:  Procedure Laterality Date  . ABDOMINAL HYSTERECTOMY     partial  . JOINT REPLACEMENT Right 1991   hip    There were no vitals filed for this visit.  Subjective Assessment - 01/11/18 1039    Subjective  Patient states "I'm doing stable and the dizziness has gotten better". Patient states "the vertigo is better too, not like it used to be".     Pertinent History  Patient and her son report that she first started getting dizziness a few months ago and it got bad on 12/05/2017. Patient seen at the ED on 106/2019 due to acute onset vertigo with nausea and vomiting and felt faint upon standing. Patient returned home and on 12/06/2017 patient got up to use the restroom at 3 am and became vertiginous, lightheaded and suffered an unwitnessed fall in her bathroom. Patient's son called EMS and she returned to the ED where it was determined that she had BPPV as leftward beating nystagmus with rightward gaze and positive right Dix-Hallpike test  were observed in the ED. Patient was treated by physical therapy with two Epley maneuvers. Head CT revealed no acute intracranial abnormalities per MR. Patient returned home. Patient has had dizziness almost every day until about 4 days ago, but reports it has not been as severe. Patient reports vertigo, "whoozy" and unsteadiness. Patient reports sitting down, getting up, quick movements, looking up, bending over. Patient reports sitting still and staying calm helps the dizziness go away. Patient lives with her son. Patient uses the rolling walker in the yard and inside at times like when her back is hurting. Patient ambulates in the community without AD and without holding onto her son's arm until this most recent episode of dizziness.     Diagnostic tests  brain MRI 12/06/2017 revealed No acute intracranial abnormality per mr.     Patient Stated Goals  Patient would like to have reduced dizziness when she moves her head and bends over.     Currently in Pain?  No/denies   states she is stiff in bilateral hips        [facility] PT Assessment - 01/11/18 1054      Dynamic Gait Index   Level Surface  Mild Impairment    Change in Gait Speed  Moderate Impairment    Gait with Horizontal Head Turns  Normal  Gait with Vertical Head Turns  Normal    Gait and Pivot Turn  Normal    Step Over Obstacle  Mild Impairment    Step Around Obstacles  Normal    Steps  Moderate Impairment    Total Score  18        Neuromuscular Re-education:  Reviewed home exercise program with patient. Patient able to recall seated marching exercise and she states she does this when she sits up on the side of the bed and she was able to recall moving more slowly with transitional movements. Patient did not remember performing feet together or semi-tandem progressions with head turns at home, but when talked with her son, he states that he has been doing these exercises with his mother. Patient has history of Alzheimer's disease  and is not able to recall her exercises. Patient lives with her son, who is her caregiver. Patient's son states he does not have any questions or concerns about the exercise program.  Patient performed 10 reps each leg seated marching. On firm surface, patient performed feet together progressions and semi-tandem progressions with alternating lead leg with and without horizontal and vertical head turns.  Multiple 30 second and 60 second reps of each with CGA.  Patient reached for // bar for support a few times with semi-tandem stance position.   Discussed progress towards goals and functional outcome testing and compared to prior testing.     PT Education - 01/11/18 1039    Education Details  discussed progress towards goals and discharge plans with patient and her son. reviewed home exercise program    Person(s) Educated  Patient;Child(ren)    Methods  Explanation    Comprehension  Verbalized understanding;Returned demonstration       PT Short Term Goals - 01/11/18 1545      PT SHORT TERM GOAL #1   Title  Patient will be able to perform home program with assistance of her family for self-management.    Baseline  Patient reports she has not been doing her exercises, but patient's son reports that he has been helping his mom do the exercises and he states he has no questions about the exercises.     Time  4    Period  Weeks    Status  Achieved        PT Long Term Goals - 01/11/18 1052      PT LONG TERM GOAL #1   Title  Patient will demonstrate reduced falls risk as evidenced by Dynamic Gait Index (DGI) 20/24 or greater.    Baseline  scored 17/24 on 12/28/2017; scored 18/24 on 01/11/18    Time  8    Period  Weeks    Status  Partially Met      PT LONG TERM GOAL #2   Title  Patient reports no vertigo with provoking motions or positions.    Baseline  patient reports she has not had any episodes of vertigo.    Time  8    Period  Weeks    Status  Achieved      PT LONG TERM GOAL  #3   Title  Patient will report 50% or greater improvement in her symptoms of dizziness and imbalance with provoking motions or positions by 02/22/2018 to improve her quality of life.     Baseline  Patient reports that she has not had a spell  and that she is "between 75-100% better"    Time  8  Period  Weeks    Status  Achieved            Plan - 01/11/18 1039    Clinical Impression Statement  Patient and her son report that patient has not had any further episodes of vertigo. Patient's son states that the patient does have occasional episodes of brief dizziness lasting a few seconds. Tested orthostatic blood pressure readings last visit and patient had a drop of 39 mmHg in systolic pressure when transitioning from supine to standing and educated patient and her son as to strategies to help minimize dizziness with position changes and patient was able to recall seated marching and moving more slowly during transitions this date. Patient unable to recall her home exercise program but patient's son reports that he has been doing the EHP with his mom and that he has no questions or concerns. Patient is reporting significant improvement in her dizziness symptoms overall and pateint and her son are in agreement with discharge from PT services at this time. Patient has met 1/1 STG and 2/3 LTGs and partially met remaining LTG. Patient reports no further episodes of vertigo and reports 75-100% improvements in her overall symptoms since starting vestibular therapy. Patient is able to perform her HEP wtih assistance of family. Will discharge patient from PT services at this time.     Rehab Potential  Good    PT Frequency  1x / week    PT Duration  8 weeks    PT Treatment/Interventions  Vestibular;Canalith Repostioning;Gait training;Stair training;Therapeutic exercise;Balance training;Neuromuscular re-education;Patient/family education    PT Next Visit Plan  --    PT Home Exercise Plan  seni-tandem and  feet together progressions with horizontal and vertical head turns on firm surface; seated marching strategy to help with orthostatic symptoms     Consulted and Agree with Plan of Care  Patient       Patient will benefit from skilled therapeutic intervention in order to improve the following deficits and impairments:  Decreased balance, Difficulty walking, Dizziness  Visit Diagnosis: Dizziness and giddiness     Problem List Patient Active Problem List   Diagnosis Date Noted  . BPPV (benign paroxysmal positional vertigo) 12/13/2017  . Depression, major, single episode, complete remission ([place]) 12/13/2017  . Osteopenia 03/17/2017  . Nausea & vomiting 08/02/2016  . Atrophic vaginitis 08/08/2015  . Vulvar cysts 08/06/2015  . Routine physical examination 08/19/2014  . Cataract 04/06/2014  . Alzheimer's disease ([place]) 04/06/2014  . Essential hypertension 04/06/2014  . Urge incontinence 11/02/2013  . Memory loss 11/02/2013  . Hyperlipidemia    Lady Deutscher PT, DPT [phone] Lady Deutscher 01/11/2018, 4:10 PM  [place] MAIN [facility] SERVICES [address] [place], [place], [address] Phone: [phone]   Fax:  [phone]  Name: Katherine Olson MRN: [id number] Date of Birth: [birth year]

## 2018-01-18 ENCOUNTER — Ambulatory Visit: Payer: Medicare HMO | Admitting: Physical Therapy

## 2018-01-25 ENCOUNTER — Ambulatory Visit: Payer: Medicare HMO | Admitting: Physical Therapy

## 2018-02-01 ENCOUNTER — Ambulatory Visit: Payer: Medicare HMO | Admitting: Physical Therapy

## 2018-02-08 ENCOUNTER — Ambulatory Visit: Payer: Medicare HMO | Admitting: Physical Therapy

## 2018-02-14 ENCOUNTER — Ambulatory Visit: Payer: Self-pay | Admitting: Family

## 2018-03-07 ENCOUNTER — Encounter: Payer: Self-pay | Admitting: Family

## 2018-03-09 ENCOUNTER — Other Ambulatory Visit: Payer: Self-pay

## 2018-03-09 MED ORDER — ZOSTER VAC RECOMB ADJUVANTED 50 MCG/0.5ML IM SUSR: 0.5000 mL | Freq: Once | INTRAMUSCULAR | 1 refills | Status: AC

## 2018-03-14 NOTE — Progress Notes (Unsigned)
Pts son wanting to know if the shringrex rx was still going to be mailed to him? Pt scheduled for an appt on 04/15/2018.

## 2018-04-06 ENCOUNTER — Other Ambulatory Visit: Payer: Self-pay | Admitting: Family

## 2018-04-06 DIAGNOSIS — I10 Essential (primary) hypertension: Secondary | ICD-10-CM

## 2018-04-08 ENCOUNTER — Other Ambulatory Visit: Payer: Self-pay

## 2018-04-08 DIAGNOSIS — F419 Anxiety disorder, unspecified: Secondary | ICD-10-CM

## 2018-04-08 MED ORDER — CITALOPRAM HYDROBROMIDE 20 MG PO TABS: 20.0000 mg | ORAL_TABLET | Freq: Every day | ORAL | 1 refills | Status: DC

## 2018-04-12 ENCOUNTER — Other Ambulatory Visit: Payer: Self-pay

## 2018-04-12 DIAGNOSIS — I10 Essential (primary) hypertension: Secondary | ICD-10-CM

## 2018-04-12 MED ORDER — AMLODIPINE BESYLATE 5 MG PO TABS: 5.0000 mg | ORAL_TABLET | Freq: Every morning | ORAL | 1 refills | Status: DC

## 2018-04-15 ENCOUNTER — Other Ambulatory Visit: Payer: Self-pay | Admitting: Family

## 2018-04-15 ENCOUNTER — Encounter: Payer: Self-pay | Admitting: Family

## 2018-04-15 ENCOUNTER — Ambulatory Visit (INDEPENDENT_AMBULATORY_CARE_PROVIDER_SITE_OTHER): Payer: Medicare HMO | Admitting: Family

## 2018-04-15 VITALS — BP 148/64 | HR 62 | Temp 97.7°F | Wt 162.0 lb

## 2018-04-15 DIAGNOSIS — I10 Essential (primary) hypertension: Secondary | ICD-10-CM | POA: Diagnosis not present

## 2018-04-15 DIAGNOSIS — F325 Major depressive disorder, single episode, in full remission: Secondary | ICD-10-CM

## 2018-04-15 DIAGNOSIS — E785 Hyperlipidemia, unspecified: Secondary | ICD-10-CM

## 2018-04-15 DIAGNOSIS — R7303 Prediabetes: Secondary | ICD-10-CM

## 2018-04-15 LAB — COMPREHENSIVE METABOLIC PANEL
ALT: 11 U/L (ref 0–35)
AST: 16 U/L (ref 0–37)
Albumin: 4.3 g/dL (ref 3.5–5.2)
Alkaline Phosphatase: 50 U/L (ref 39–117)
BUN: 17 mg/dL (ref 6–23)
CHLORIDE: 106 meq/L (ref 96–112)
CO2: 26 mEq/L (ref 19–32)
Calcium: 10.3 mg/dL (ref 8.4–10.5)
Creatinine, Ser: 1.14 mg/dL (ref 0.40–1.20)
GFR: 54.2 mL/min — ABNORMAL LOW (ref >60.00)
Glucose, Bld: 86 mg/dL (ref 70–99)
Potassium: 4.4 mEq/L (ref 3.5–5.1)
Sodium: 140 mEq/L (ref 135–145)
Total Bilirubin: 0.4 mg/dL (ref 0.2–1.2)
Total Protein: 7.2 g/dL (ref 6.0–8.3)

## 2018-04-15 LAB — LIPID PANEL
Cholesterol: 188 mg/dL (ref 0–200)
HDL: 72.2 mg/dL (ref >39.00)
LDL Cholesterol: 104 mg/dL — ABNORMAL HIGH (ref 0–99)
NonHDL: 116.18
Total CHOL/HDL Ratio: 3
Triglycerides: 60 mg/dL (ref 0.0–149.0)
VLDL: 12 mg/dL (ref 0.0–40.0)

## 2018-04-15 LAB — HEMOGLOBIN A1C: Hgb A1c MFr Bld: 5.7 % (ref 4.6–6.5)

## 2018-04-15 NOTE — Assessment & Plan Note (Signed)
Stable.continue regimen

## 2018-04-15 NOTE — Assessment & Plan Note (Signed)
Pending labs

## 2018-04-15 NOTE — Progress Notes (Signed)
Subjective:    Patient ID: Katherine Olson, female    DOB: 20-Aug-1926, 85 y.o.   MRN: [id number]  CC: SOLARIS KRAM is a 85 y.o. female who presents today for follow up.   HPI: Accompanied by son today   HLD- taking pravachol once per week. Unsure if she needs zetia; taking zetia daily.   HTN- at home, 136/72, 134/75. No cp.   Depression- doing well on celexa 30 mg. Sleeping well.   Vertigo has resolved.   No longer doing mammogram.  Declines bone density scan.      HISTORY:  Past Medical History:  Diagnosis Date  . Depression   . Hyperlipidemia   . Hypertension    Past Surgical History:  Procedure Laterality Date  . ABDOMINAL HYSTERECTOMY     partial  . JOINT REPLACEMENT Right 1991   hip   Family History  Problem Relation Age of Onset  . Alzheimer's disease Brother   . Heart disease Sister   . Cancer Sister        kidney  . Cancer Mother        liver  . Cancer Father        prostate    Allergies: Simvastatin Current Outpatient Medications on File Prior to Visit  Medication Sig Dispense Refill  . amLODipine (NORVASC) 5 MG tablet Take 1 tablet (5 mg total) by mouth every morning. 90 tablet 1  . citalopram (CELEXA) 10 MG tablet Take 1 tablet (10 mg total) by mouth daily. Take with 20 mg in am to equal (30 mg total) per day 90 tablet 1  . citalopram (CELEXA) 20 MG tablet Take 1 tablet (20 mg total) by mouth daily. In am. With 10 mg to equal 30 mg daily in am 90 tablet 1  . Coenzyme Q-10 200 MG CAPS Take by mouth.    . ezetimibe (ZETIA) 10 MG tablet Take 1 tablet (10 mg total) by mouth daily. 90 tablet 1  . losartan (COZAAR) 50 MG tablet Take 1 tablet (50 mg total) by mouth daily. 90 tablet 3  . Multiple Vitamins-Minerals (MULTIVITAMIN WITH MINERALS) tablet Take 1 tablet by mouth daily.    . Omega-3 Fatty Acids (FISH OIL PO) Take by mouth.    . pravastatin (PRAVACHOL) 20 MG tablet Take 1 tablet (20 mg total) by mouth daily. 90 tablet 3   No current  facility-administered medications on file prior to visit.     Social History   Tobacco Use  . Smoking status: Never Smoker  . Smokeless tobacco: Never Used  Substance Use Topics  . Alcohol use: No  . Drug use: No    Review of Systems  Constitutional: Negative for chills and fever.  Respiratory: Negative for cough and shortness of breath.   Cardiovascular: Negative for chest pain, palpitations and leg swelling.  Gastrointestinal: Negative for nausea and vomiting.      Objective:    BP (!) 148/64 (BP Location: Right Arm, Cuff Size: Large)   Pulse 62   Temp 97.7 F (36.5 C)   Wt 162 lb (73.5 kg)   SpO2 95%   BMI 25.37 kg/m  BP Readings from Last 3 Encounters:  04/15/18 (!) 148/64  12/13/17 140/80  06/11/17 (!) 144/86   Wt Readings from Last 3 Encounters:  04/15/18 162 lb (73.5 kg)  12/13/17 159 lb 8 oz (72.3 kg)  06/11/17 156 lb 8 oz (71 kg)    Physical Exam Vitals signs reviewed.  Constitutional:  Appearance: She is well-developed.  Eyes:     Conjunctiva/sclera: Conjunctivae normal.  Cardiovascular:     Rate and Rhythm: Normal rate and regular rhythm.     Pulses: Normal pulses.     Heart sounds: Normal heart sounds.  Pulmonary:     Effort: Pulmonary effort is normal.     Breath sounds: Normal breath sounds. No wheezing, rhonchi or rales.  Musculoskeletal:     Right lower leg: No edema.     Left lower leg: No edema.  Skin:    General: Skin is warm and dry.  Neurological:     Mental Status: She is alert.  Psychiatric:        Speech: Speech normal.        Behavior: Behavior normal.        Thought Content: Thought content normal.        Assessment & Plan:   Problem List Items Addressed This Visit      Cardiovascular and Mediastinum   Essential hypertension - Primary    Pleased with home readings. Advised son to continue to monitor, if persistently high at home, we will adjust medication.       Relevant Orders   Comprehensive metabolic panel    Lipid panel     Other   Hyperlipidemia    Pending labs      Relevant Orders   Lipid panel   Depression, major, single episode, complete remission (H[facility])    Stable.continue regimen       Other Visit Diagnoses    Prediabetes       Relevant Orders   Hemoglobin A1c       I am having Katherine Olson "Katherine Olson" maintain her multivitamin with minerals, Omega-3 Fatty Acids (FISH OIL PO), Coenzyme Q-10, citalopram, ezetimibe, pravastatin, losartan, citalopram, and amLODipine.   No orders of the defined types were placed in this encounter.   Return precautions given.   Risks, benefits, and alternatives of the medications and treatment plan prescribed today were discussed, and patient expressed understanding.   Education regarding symptom management and diagnosis given to patient on AVS.  Continue to follow with Burnard Hawthorne, FNP for routine health maintenance.   Katherine Olson and I agreed with plan.   Mable Paris, FNP

## 2018-04-15 NOTE — Patient Instructions (Signed)
Always a pleasure seeing you.  Let us consider a bone density scan in the future.

## 2018-04-15 NOTE — Assessment & Plan Note (Signed)
Pleased with home readings. Advised son to continue to monitor, if persistently high at home, we will adjust medication.

## 2018-04-18 ENCOUNTER — Other Ambulatory Visit: Payer: Self-pay | Admitting: Family

## 2018-05-10 ENCOUNTER — Other Ambulatory Visit: Payer: Self-pay

## 2018-05-10 ENCOUNTER — Telehealth: Payer: Self-pay | Admitting: Family

## 2018-05-10 MED ORDER — EZETIMIBE 10 MG PO TABS: 10.0000 mg | ORAL_TABLET | Freq: Every day | ORAL | 3 refills | Status: DC

## 2018-05-10 NOTE — Telephone Encounter (Signed)
Copied from [place] [id number]. Topic: General - Inquiry >> May 10, 2018  2:06 PM [name] wrote: Reason for CRM: Pt's son [name] stated that at the last OV on 04/15/18 his understanding was that pt would stop taking the ezetimibe (ZETIA) 10 MG tablet and start taking pravastatin (PRAVACHOL) 20 MG tablet however pt's son stated a new rx for Zetia was sent to pharmacy today. Pt's son would like a callback as soon as possible with clarification on which medication pt should be taking. Please advise. CB#[phone]

## 2018-05-11 ENCOUNTER — Other Ambulatory Visit: Payer: Self-pay

## 2018-05-11 NOTE — Telephone Encounter (Signed)
Please call son Yes we discontinued the Zetia.  We thought that the pravastatin would be more effective.  Please advise son that he is correct.  She is not on Zetia.

## 2018-05-11 NOTE — Telephone Encounter (Signed)
Son following up on message.  Erik Obey out of office yesterday, but in today and she will get the message. Son verbalized understanding.

## 2018-05-11 NOTE — Telephone Encounter (Signed)
I called [facility] to cancel prescription for Zetia. I have called patient son to let him know patient is not be taking this medication & it was cancelled.

## 2018-07-26 ENCOUNTER — Telehealth: Payer: Self-pay | Admitting: Family

## 2018-07-26 DIAGNOSIS — I10 Essential (primary) hypertension: Secondary | ICD-10-CM

## 2018-07-26 DIAGNOSIS — F419 Anxiety disorder, unspecified: Secondary | ICD-10-CM

## 2018-07-26 MED ORDER — CITALOPRAM HYDROBROMIDE 20 MG PO TABS: 20.0000 mg | ORAL_TABLET | Freq: Every day | ORAL | 1 refills | Status: DC

## 2018-07-26 MED ORDER — CITALOPRAM HYDROBROMIDE 10 MG PO TABS: 10.0000 mg | ORAL_TABLET | Freq: Every day | ORAL | 1 refills | Status: DC

## 2018-07-26 MED ORDER — AMLODIPINE BESYLATE 5 MG PO TABS: 5.0000 mg | ORAL_TABLET | Freq: Every morning | ORAL | 1 refills | Status: DC

## 2018-07-26 NOTE — Telephone Encounter (Signed)
Copied from [place] [id number]. Topic: Quick Communication - Rx Refill/Question >> Jul 26, 2018  9:46 AM Sheppard Coil, [employer] L wrote: Medication:  citalopram (CELEXA) 10 MG tablet citalopram (CELEXA) 20 MG tablet amLODipine (NORVASC) 5 MG tablet  Has the patient contacted their pharmacy? Pharmacy calling (Agent: If no, request that the patient contact the pharmacy for the refill.) (Agent: If yes, when and what did the pharmacy advise?)  Preferred Pharmacy (with phone number or street name): [place], [place] - [place] [phone] (Phone) [phone] (Fax)  Agent: Please be advised that RX refills may take up to 3 business days. We ask that you follow-up with your pharmacy.

## 2018-07-26 NOTE — Telephone Encounter (Signed)
Refill sent.

## 2018-08-01 ENCOUNTER — Telehealth: Payer: Self-pay | Admitting: *Deleted

## 2018-08-01 NOTE — Telephone Encounter (Signed)
Copied from [place] [phone]. Topic: General - Other >> Aug 01, 2018  9:35 AM Yvette Rack wrote: Reason for CRM: Pt son [name] called in stating the pharmacy informed them that they are not doing any vaccinations and pt will be due for the second Shingrix vaccine soon. [name] requests a call back. Cb# [phone]

## 2018-08-01 NOTE — Telephone Encounter (Signed)
I spoke with patient's son & let him now that side effects from this vaccine may present like COVID-19. Due to this the pharmacies just aren't giving this vaccine at this time. He verbalized understanding & just wanted to hear this from providers office.

## 2018-08-03 ENCOUNTER — Encounter: Payer: Self-pay | Admitting: Family

## 2018-08-31 ENCOUNTER — Ambulatory Visit: Payer: Medicare HMO | Admitting: Family

## 2018-10-10 ENCOUNTER — Encounter: Payer: Self-pay | Admitting: Family

## 2018-10-14 ENCOUNTER — Ambulatory Visit: Payer: Self-pay | Admitting: Family

## 2018-12-04 ENCOUNTER — Other Ambulatory Visit: Payer: Self-pay | Admitting: Family

## 2018-12-04 DIAGNOSIS — I10 Essential (primary) hypertension: Secondary | ICD-10-CM

## 2018-12-05 ENCOUNTER — Telehealth: Payer: Self-pay | Admitting: Family

## 2018-12-05 ENCOUNTER — Other Ambulatory Visit: Payer: Self-pay

## 2018-12-05 DIAGNOSIS — F419 Anxiety disorder, unspecified: Secondary | ICD-10-CM

## 2018-12-05 DIAGNOSIS — I10 Essential (primary) hypertension: Secondary | ICD-10-CM

## 2018-12-05 MED ORDER — LOSARTAN POTASSIUM 50 MG PO TABS: 50.0000 mg | ORAL_TABLET | Freq: Every day | ORAL | 3 refills | Status: DC

## 2018-12-05 MED ORDER — CITALOPRAM HYDROBROMIDE 10 MG PO TABS: 10.0000 mg | ORAL_TABLET | Freq: Every day | ORAL | 1 refills | Status: DC

## 2018-12-05 NOTE — Telephone Encounter (Addendum)
Pharmacist requesting losartan (COZAAR) 50 MG tablet and citalopram (CELEXA) 10 MG tablet, informed please allow 48 to 72 hour turn around time, pharmacist states been awaiting 1 month for a response, please advise    [place], [place], [place]  [place] [address]  Phone: [phone] Fax: [phone]  Not a 24 hour pharmacy; exact hours not known.

## 2018-12-05 NOTE — Telephone Encounter (Signed)
I have refilled & sent to [facility].

## 2018-12-08 ENCOUNTER — Other Ambulatory Visit: Payer: Self-pay

## 2018-12-23 ENCOUNTER — Other Ambulatory Visit: Payer: Self-pay | Admitting: Family

## 2019-01-09 ENCOUNTER — Other Ambulatory Visit: Payer: Self-pay

## 2019-01-09 ENCOUNTER — Other Ambulatory Visit: Payer: Self-pay | Admitting: Family

## 2019-01-09 DIAGNOSIS — E785 Hyperlipidemia, unspecified: Secondary | ICD-10-CM

## 2019-01-09 MED ORDER — PRAVASTATIN SODIUM 20 MG PO TABS: 20.0000 mg | ORAL_TABLET | Freq: Every day | ORAL | 1 refills | Status: DC

## 2019-01-09 NOTE — Telephone Encounter (Signed)
Medication Refill - Medication:  pravastatin (PRAVACHOL) 20 MG tablet  Has the patient contacted their pharmacy? Yes advised to call office.   Preferred Pharmacy (with phone number or street name):  [place], [place] - [place] [phone] (Phone) [phone] (Fax)     Agent: Please be advised that RX refills may take up to 3 business days. We ask that you follow-up with your pharmacy.

## 2019-04-05 ENCOUNTER — Other Ambulatory Visit: Payer: Self-pay | Admitting: Family

## 2019-04-05 DIAGNOSIS — F419 Anxiety disorder, unspecified: Secondary | ICD-10-CM

## 2019-04-07 ENCOUNTER — Ambulatory Visit (INDEPENDENT_AMBULATORY_CARE_PROVIDER_SITE_OTHER): Payer: Medicare HMO | Admitting: Family

## 2019-04-07 ENCOUNTER — Other Ambulatory Visit: Payer: Self-pay

## 2019-04-07 ENCOUNTER — Ambulatory Visit: Payer: Medicare HMO | Admitting: Family

## 2019-04-07 ENCOUNTER — Encounter: Payer: Self-pay | Admitting: Family

## 2019-04-07 VITALS — BP 139/85 | Ht 64.0 in | Wt 163.0 lb

## 2019-04-07 DIAGNOSIS — F419 Anxiety disorder, unspecified: Secondary | ICD-10-CM

## 2019-04-07 DIAGNOSIS — E785 Hyperlipidemia, unspecified: Secondary | ICD-10-CM | POA: Diagnosis not present

## 2019-04-07 DIAGNOSIS — F325 Major depressive disorder, single episode, in full remission: Secondary | ICD-10-CM

## 2019-04-07 DIAGNOSIS — I10 Essential (primary) hypertension: Secondary | ICD-10-CM

## 2019-04-07 MED ORDER — CITALOPRAM HYDROBROMIDE 10 MG PO TABS: 10.0000 mg | ORAL_TABLET | Freq: Every day | ORAL | 1 refills | Status: DC

## 2019-04-07 MED ORDER — AMLODIPINE BESYLATE 5 MG PO TABS: 5.0000 mg | ORAL_TABLET | Freq: Every morning | ORAL | 3 refills | Status: DC

## 2019-04-07 NOTE — Assessment & Plan Note (Addendum)
Doing well on celexa. Will continue.

## 2019-04-07 NOTE — Progress Notes (Signed)
Virtual Visit via Video Note  I connected with@  on 04/07/19 at 10:00 AM EST by a video enabled telemedicine application and verified that I am speaking with the correct person using two identifiers.  Location patient: home Location provider:work  Persons participating in the virtual visit: patient, provider  I discussed the limitations of evaluation and management by telemedicine and the availability of in person appointments. The patient expressed understanding and agreed to proceed.   HPI: Feels well, no complaints. Coping well.  Accompanied by son today.  Walking around house and NO CP. NO recent falls.  Working on getting covid vaccine.   HTN- 139/85 , HR 64. today however hasnt taken blood pressure medication yet. Typically 116/76.  Denies exertional chest pain or pressure, numbness or tingling radiating to left arm or jaw, palpitations, dizziness, frequent headaches, changes in vision, or shortness of breath.    HLD- compliant with medication. Taking pravachol every day.   Depression- doing well celexa 30mg . Sleeping well.    ROS: See pertinent positives and negatives per HPI.  Past Medical History:  Diagnosis Date  . Depression   . Hyperlipidemia   . Hypertension     Past Surgical History:  Procedure Laterality Date  . ABDOMINAL HYSTERECTOMY     partial  . JOINT REPLACEMENT Right 1991   hip    Family History  Problem Relation Age of Onset  . Alzheimer's disease Brother   . Heart disease Sister   . Cancer Sister        kidney  . Cancer Mother        liver  . Cancer Father        prostate    SOCIAL [id number] smoker   Current Outpatient Medications:  .  citalopram (CELEXA) 20 MG tablet, TAKE ONE TABLET BY MOUTH EVERY MORNING ALONG WITH 10 MG TO EQUAL 30 MG DAILY, Disp: 90 tablet, Rfl: 1 .  Coenzyme Q-10 200 MG CAPS, Take by mouth., Disp: , Rfl:  .  losartan (COZAAR) 50 MG tablet, Take 1 tablet (50 mg total) by mouth daily., Disp: 90 tablet, Rfl: 3 .   Multiple Vitamins-Minerals (MULTIVITAMIN WITH MINERALS) tablet, Take 1 tablet by mouth daily., Disp: , Rfl:  .  Omega-3 Fatty Acids (FISH OIL PO), Take by mouth., Disp: , Rfl:  .  pravastatin (PRAVACHOL) 20 MG tablet, Take 1 tablet (20 mg total) by mouth daily., Disp: 90 tablet, Rfl: 1 .  amLODipine (NORVASC) 5 MG tablet, Take 1 tablet (5 mg total) by mouth every morning., Disp: 90 tablet, Rfl: 3 .  citalopram (CELEXA) 10 MG tablet, Take 1 tablet (10 mg total) by mouth daily. Take with 20 mg in am to equal (30 mg total) per day, Disp: 90 tablet, Rfl: 1  EXAM:  VITALS per patient if applicable: Vitals:   [id number]  BP: 139/85    GENERAL: alert, oriented, appears well and in no acute distress  HEENT: atraumatic, conjunttiva clear, no obvious abnormalities on inspection of external nose and ears  NECK: normal movements of the head and neck  LUNGS: on inspection no signs of respiratory distress, breathing rate appears normal, no obvious gross SOB, gasping or wheezing  CV: no obvious cyanosis  MS: moves all visible extremities without noticeable abnormality  PSYCH/NEURO: pleasant and cooperative, no obvious depression or anxiety, speech and thought processing grossly intact  ASSESSMENT AND PLAN:  Discussed the following assessment and plan:  Essential hypertension - Plan: Comprehensive metabolic panel, Hemoglobin A1c  Hyperlipidemia, unspecified  hyperlipidemia type - Plan: Hemoglobin A1c, Lipid panel  Depression, major, single episode, complete remission ([place]) Problem List Items Addressed This Visit      Cardiovascular and Mediastinum   Essential hypertension - Primary    At goal. Continue current regimen      Relevant Orders   Comprehensive metabolic panel   Hemoglobin A1c     Other   Depression, major, single episode, complete remission ([place])    Doing well on celexa. Will continue.       Hyperlipidemia    Doing well on medication, taking pravachol QD.        Relevant Orders   Hemoglobin A1c   Lipid panel     Declines DEXA, mammogram.   -we discussed possible serious and likely etiologies, options for evaluation and workup, limitations of telemedicine visit vs in person visit, treatment, treatment risks and precautions. Pt prefers to treat via telemedicine empirically rather then risking or undertaking an in person visit at this moment. Patient agrees to seek prompt in person care if worsening, new symptoms arise, or if is not improving with treatment.   I discussed the assessment and treatment plan with the patient. The patient was provided an opportunity to ask questions and all were answered. The patient agreed with the plan and demonstrated an understanding of the instructions.   The patient was advised to call back or seek an in-person evaluation if the symptoms worsen or if the condition fails to improve as anticipated.   Mable Paris, FNP

## 2019-04-07 NOTE — Assessment & Plan Note (Signed)
At goal. Continue current regimen. 

## 2019-04-07 NOTE — Assessment & Plan Note (Signed)
Doing well on medication, taking pravachol QD.

## 2019-04-17 ENCOUNTER — Other Ambulatory Visit: Payer: Self-pay

## 2019-04-17 ENCOUNTER — Other Ambulatory Visit (INDEPENDENT_AMBULATORY_CARE_PROVIDER_SITE_OTHER): Payer: Medicare HMO

## 2019-04-17 ENCOUNTER — Other Ambulatory Visit: Payer: Medicare HMO

## 2019-04-17 DIAGNOSIS — E785 Hyperlipidemia, unspecified: Secondary | ICD-10-CM | POA: Diagnosis not present

## 2019-04-17 DIAGNOSIS — I10 Essential (primary) hypertension: Secondary | ICD-10-CM | POA: Diagnosis not present

## 2019-04-17 LAB — COMPREHENSIVE METABOLIC PANEL
ALT: 8 U/L (ref 0–35)
AST: 13 U/L (ref 0–37)
Albumin: 4.1 g/dL (ref 3.5–5.2)
Alkaline Phosphatase: 52 U/L (ref 39–117)
BUN: 12 mg/dL (ref 6–23)
CO2: 27 mEq/L (ref 19–32)
Calcium: 9.5 mg/dL (ref 8.4–10.5)
Chloride: 102 mEq/L (ref 96–112)
Creatinine, Ser: 0.96 mg/dL (ref 0.40–1.20)
GFR: 65.94 mL/min (ref >60.00)
Glucose, Bld: 75 mg/dL (ref 70–99)
Potassium: 4.3 mEq/L (ref 3.5–5.1)
Sodium: 137 mEq/L (ref 135–145)
Total Bilirubin: 0.5 mg/dL (ref 0.2–1.2)
Total Protein: 6.6 g/dL (ref 6.0–8.3)

## 2019-04-17 LAB — LIPID PANEL
Cholesterol: 179 mg/dL (ref 0–200)
HDL: 74 mg/dL (ref >39.00)
LDL Cholesterol: 93 mg/dL (ref 0–99)
NonHDL: 105.48
Total CHOL/HDL Ratio: 2
Triglycerides: 63 mg/dL (ref 0.0–149.0)
VLDL: 12.6 mg/dL (ref 0.0–40.0)

## 2019-04-17 LAB — HEMOGLOBIN A1C: Hgb A1c MFr Bld: 5.8 % (ref 4.6–6.5)

## 2019-04-21 ENCOUNTER — Ambulatory Visit: Payer: Self-pay

## 2019-04-23 ENCOUNTER — Ambulatory Visit: Payer: Medicare HMO | Attending: Internal Medicine

## 2019-04-23 DIAGNOSIS — Z23 Encounter for immunization: Secondary | ICD-10-CM | POA: Insufficient documentation

## 2019-04-23 NOTE — Progress Notes (Signed)
   Covid-19 Vaccination Clinic  Name:  Katherine Olson    MRN: [id number] DOB: January 05, 1927  04/23/2019  Katherine Olson was observed post Covid-19 immunization for 15 minutes without incidence. She was provided with Vaccine Information Sheet and instruction to access the V-Safe system.   Katherine Olson was instructed to call 911 with any severe reactions post vaccine: Marland Kitchen Difficulty breathing  . Swelling of your face and throat  . A fast heartbeat  . A bad rash all over your body  . Dizziness and weakness    Immunizations Administered    Name Date Dose VIS Date Route   Pfizer COVID-19 Vaccine 04/23/2019 10:02 AM 0.3 mL 02/10/2019 Intramuscular   Manufacturer: [place]   Lot: [id number]   [place]: [id number]

## 2019-04-30 ENCOUNTER — Encounter: Payer: Self-pay | Admitting: Family

## 2019-05-01 ENCOUNTER — Other Ambulatory Visit: Payer: Self-pay | Admitting: Family

## 2019-05-01 NOTE — Telephone Encounter (Signed)
Pt son called to check on the status of refill

## 2019-05-17 ENCOUNTER — Ambulatory Visit: Payer: Medicare HMO | Attending: Internal Medicine

## 2019-05-17 DIAGNOSIS — Z23 Encounter for immunization: Secondary | ICD-10-CM

## 2019-05-17 NOTE — Progress Notes (Signed)
   Covid-19 Vaccination Clinic  Name:  ZYKIRA MASTRANGELO    MRN: [id number] DOB: December 25, 1926  05/17/2019  Ms. Hagler was observed post Covid-19 immunization for 15 minutes without incident. She was provided with Vaccine Information Sheet and instruction to access the V-Safe system.   Ms. Thum was instructed to call 911 with any severe reactions post vaccine: Marland Kitchen Difficulty breathing  . Swelling of face and throat  . A fast heartbeat  . A bad rash all over body  . Dizziness and weakness   Immunizations Administered    Name Date Dose VIS Date Route   Pfizer COVID-19 Vaccine 05/17/2019  8:39 AM 0.3 mL 02/10/2019 Intramuscular   Manufacturer: [place]   Lot: [id number]   [place]: [id number]

## 2019-07-16 ENCOUNTER — Other Ambulatory Visit: Payer: Self-pay | Admitting: Family

## 2019-07-16 DIAGNOSIS — E785 Hyperlipidemia, unspecified: Secondary | ICD-10-CM

## 2019-08-10 ENCOUNTER — Ambulatory Visit: Payer: Medicare HMO | Admitting: Podiatry

## 2019-08-14 ENCOUNTER — Other Ambulatory Visit: Payer: Self-pay

## 2019-08-14 ENCOUNTER — Encounter: Payer: Self-pay | Admitting: Podiatry

## 2019-08-14 ENCOUNTER — Ambulatory Visit: Payer: Medicare HMO | Admitting: Podiatry

## 2019-08-14 DIAGNOSIS — M79675 Pain in left toe(s): Secondary | ICD-10-CM | POA: Insufficient documentation

## 2019-08-14 DIAGNOSIS — M79674 Pain in right toe(s): Secondary | ICD-10-CM

## 2019-08-14 DIAGNOSIS — B351 Tinea unguium: Secondary | ICD-10-CM

## 2019-08-14 NOTE — Progress Notes (Signed)
This patient returns to the office for evaluation and treatment of long thick painful nails .  This patient is unable to trim her own nails since the patient cannot reach her feet.  Patient says the nails are painful walking and wearing her shoes.  She returns for preventive foot care services. Patient has not been seen in almost 2 years.  General Appearance  Alert, conversant and in no acute stress.  Vascular  Dorsalis pedis and posterior tibial  pulses are palpable  bilaterally.  Capillary return is within normal limits  bilaterally. Temperature is within normal limits  bilaterally.  Neurologic  Senn-Weinstein monofilament wire test within normal limits  bilaterally. Muscle power within normal limits bilaterally.  Nails Thick disfigured discolored nails with subungual debris  from hallux to fifth toes bilaterally. No evidence of bacterial infection or drainage bilaterally.  Orthopedic  No limitations of motion  feet .  No crepitus or effusions noted.  No bony pathology or digital deformities noted.  Skin  normotropic skin with no porokeratosis noted bilaterally.  No signs of infections or ulcers noted.     Onychomycosis  Pain in toes right foot  Pain in toes left foot  Debridement  of nails  1-5  B/L with a nail nipper.  Nails were then filed using a dremel tool with no incidents.    RTC   prn   Gardiner Barefoot DPM

## 2019-09-21 ENCOUNTER — Other Ambulatory Visit: Payer: Self-pay | Admitting: Family

## 2019-09-21 DIAGNOSIS — F419 Anxiety disorder, unspecified: Secondary | ICD-10-CM

## 2019-10-10 ENCOUNTER — Ambulatory Visit: Payer: Medicare HMO | Admitting: Family

## 2019-12-07 ENCOUNTER — Other Ambulatory Visit: Payer: Self-pay | Admitting: Family

## 2019-12-07 DIAGNOSIS — E785 Hyperlipidemia, unspecified: Secondary | ICD-10-CM

## 2019-12-11 ENCOUNTER — Telehealth: Payer: Self-pay

## 2019-12-11 DIAGNOSIS — I10 Essential (primary) hypertension: Secondary | ICD-10-CM

## 2019-12-11 MED ORDER — LOSARTAN POTASSIUM 50 MG PO TABS: 50.0000 mg | ORAL_TABLET | Freq: Every day | ORAL | 3 refills | Status: DC

## 2019-12-11 NOTE — Telephone Encounter (Signed)
Pt said [facility] sent last week a request for losartan. Pt needs refill sent to them as soon as we can.

## 2020-01-05 ENCOUNTER — Other Ambulatory Visit: Payer: Self-pay | Admitting: Family

## 2020-01-05 DIAGNOSIS — F419 Anxiety disorder, unspecified: Secondary | ICD-10-CM

## 2020-01-08 ENCOUNTER — Other Ambulatory Visit: Payer: Self-pay

## 2020-01-08 ENCOUNTER — Ambulatory Visit: Payer: Medicare HMO | Admitting: Podiatry

## 2020-01-08 ENCOUNTER — Encounter: Payer: Self-pay | Admitting: Podiatry

## 2020-01-08 DIAGNOSIS — B351 Tinea unguium: Secondary | ICD-10-CM | POA: Diagnosis not present

## 2020-01-08 DIAGNOSIS — F0280 Dementia in other diseases classified elsewhere without behavioral disturbance: Secondary | ICD-10-CM

## 2020-01-08 DIAGNOSIS — M79674 Pain in right toe(s): Secondary | ICD-10-CM | POA: Diagnosis not present

## 2020-01-08 DIAGNOSIS — M79675 Pain in left toe(s): Secondary | ICD-10-CM

## 2020-01-08 DIAGNOSIS — G309 Alzheimer's disease, unspecified: Secondary | ICD-10-CM

## 2020-01-08 NOTE — Progress Notes (Signed)

## 2020-01-16 ENCOUNTER — Encounter: Payer: Self-pay | Admitting: Family

## 2020-01-16 ENCOUNTER — Telehealth (INDEPENDENT_AMBULATORY_CARE_PROVIDER_SITE_OTHER): Payer: Medicare HMO | Admitting: Family

## 2020-01-16 VITALS — BP 137/80 | HR 76 | Ht 64.0 in | Wt 154.0 lb

## 2020-01-16 DIAGNOSIS — F419 Anxiety disorder, unspecified: Secondary | ICD-10-CM

## 2020-01-16 DIAGNOSIS — I10 Essential (primary) hypertension: Secondary | ICD-10-CM | POA: Diagnosis not present

## 2020-01-16 DIAGNOSIS — E785 Hyperlipidemia, unspecified: Secondary | ICD-10-CM

## 2020-01-16 DIAGNOSIS — F32A Depression, unspecified: Secondary | ICD-10-CM

## 2020-01-16 DIAGNOSIS — F325 Major depressive disorder, single episode, in full remission: Secondary | ICD-10-CM | POA: Diagnosis not present

## 2020-01-16 MED ORDER — BUSPIRONE HCL 5 MG PO TABS: 5.0000 mg | ORAL_TABLET | Freq: Every evening | ORAL | 1 refills | Status: DC | PRN

## 2020-01-16 NOTE — Patient Instructions (Addendum)
  Trial of buspar   Decrease celexa to 20mg .

## 2020-01-16 NOTE — Assessment & Plan Note (Signed)
Stable. Continue pravachol 20mg 

## 2020-01-16 NOTE — Assessment & Plan Note (Signed)
Uncontrolled. Opted to decrease celexa to 20mg  as she is over 86 years old. Will adjunct with buspar 5mg  qhs prn for anxiety.

## 2020-01-16 NOTE — Assessment & Plan Note (Signed)
Stable. Continue norvasc 5mg , losartan 50mg 

## 2020-01-16 NOTE — Progress Notes (Signed)
Virtual Visit via Video Note  I connected with@  on 01/16/20 at 11:00 AM EST by a video enabled telemedicine application and verified that I am speaking with the correct person using two identifiers.  Location patient: home Location provider:work  Persons participating in the virtual visit: patient, provider  I discussed the limitations of evaluation and management by telemedicine and the availability of in person appointments. The patient expressed understanding and agreed to proceed.   HPI: Accompanied by son today, [name].  Son's only concern is that his mother get's something on her mind and she feels she cannot stop thinking about it. Worried about OCD and anxiety. He would like something to use prn at night.  Then may forget that she discussed a concern , an hour or two later may bring it back up. Checks door once to see lock. No repetitive behaviors. Depression well controlled. Sleeping well. Compliant with celexa 30mg  and thinks helpful for depression.   HLD - compliant with pravachol  HTN- compliant with losartan 50mg , amlodipine 5mg . Usually 128/80. No cp, sob.   ROS: See pertinent positives and negatives per HPI.    EXAM:  VITALS per patient if applicable: BP 409/81   Pulse 76   Ht 5\' 4"  (1.626 m)   Wt 154 lb (69.9 kg)   BMI 26.43 kg/m  BP Readings from Last 3 Encounters:  01/16/20 137/80  04/07/19 139/85  04/15/18 (!) 148/64   Wt Readings from Last 3 Encounters:  01/16/20 154 lb (69.9 kg)  04/07/19 163 lb (73.9 kg)  04/15/18 162 lb (73.5 kg)    GENERAL: alert, oriented, appears well and in no acute distress  HEENT: atraumatic, conjunttiva clear, no obvious abnormalities on inspection of external nose and ears  NECK: normal movements of the head and neck  LUNGS: on inspection no signs of respiratory distress, breathing rate appears normal, no obvious gross SOB, gasping or wheezing  CV: no obvious cyanosis  MS: moves all visible extremities without  noticeable abnormality  PSYCH/NEURO: pleasant and cooperative, no obvious depression or anxiety, speech and thought processing grossly intact  ASSESSMENT AND PLAN:  Discussed the following assessment and plan:  Problem List Items Addressed This Visit      Cardiovascular and Mediastinum   Essential hypertension    Stable. Continue norvasc 5mg , losartan 50mg       Relevant Orders   Comprehensive metabolic panel     Other   Anxiety and depression - Primary    Uncontrolled. Opted to decrease celexa to 20mg  as she is over 5 years old. Will adjunct with buspar 5mg  qhs prn for anxiety.        Relevant Medications   busPIRone (BUSPAR) 5 MG tablet   Hyperlipidemia    Stable. Continue pravachol 20mg          -we discussed possible serious and likely etiologies, options for evaluation and workup, limitations of telemedicine visit vs in person visit, treatment, treatment risks and precautions. Pt prefers to treat via telemedicine empirically rather then risking or undertaking an in person visit at this moment.  .   I discussed the assessment and treatment plan with the patient. The patient was provided an opportunity to ask questions and all were answered. The patient agreed with the plan and demonstrated an understanding of the instructions.   The patient was advised to call back or seek an in-person evaluation if the symptoms worsen or if the condition fails to improve as anticipated.   Mable Paris, FNP

## 2020-01-30 ENCOUNTER — Other Ambulatory Visit (INDEPENDENT_AMBULATORY_CARE_PROVIDER_SITE_OTHER): Payer: Medicare HMO

## 2020-01-30 ENCOUNTER — Other Ambulatory Visit: Payer: Self-pay

## 2020-01-30 DIAGNOSIS — I10 Essential (primary) hypertension: Secondary | ICD-10-CM

## 2020-01-30 LAB — COMPREHENSIVE METABOLIC PANEL
ALT: 8 U/L (ref 0–35)
AST: 14 U/L (ref 0–37)
Albumin: 4.3 g/dL (ref 3.5–5.2)
Alkaline Phosphatase: 51 U/L (ref 39–117)
BUN: 13 mg/dL (ref 6–23)
CO2: 27 mEq/L (ref 19–32)
Calcium: 9.9 mg/dL (ref 8.4–10.5)
Chloride: 104 mEq/L (ref 96–112)
Creatinine, Ser: 1.11 mg/dL (ref 0.40–1.20)
GFR: 43.41 mL/min — ABNORMAL LOW (ref >60.00)
Glucose, Bld: 92 mg/dL (ref 70–99)
Potassium: 4.2 mEq/L (ref 3.5–5.1)
Sodium: 137 mEq/L (ref 135–145)
Total Bilirubin: 0.5 mg/dL (ref 0.2–1.2)
Total Protein: 7.2 g/dL (ref 6.0–8.3)

## 2020-01-31 ENCOUNTER — Encounter: Payer: Self-pay | Admitting: Family

## 2020-02-01 ENCOUNTER — Telehealth: Payer: Self-pay

## 2020-02-01 NOTE — Telephone Encounter (Signed)
Pt's son called & was still concerned about his mom's eGFR? He wanted to know if the new GFR calculations for 2021 were possibly more accurate than in previous checks? He is also concerned about growth on the kidney that was being surveillance. The size hadn't change din so ling they hadn't continued to f/u. He wants advise on if this could be causing the drop in function?

## 2020-02-02 NOTE — Telephone Encounter (Signed)
Pt's son is so worried about patient & he did schedule a VV to discuss eGFR. Imagining a surveillance of renal cyst was done by [facility] & I can see in imaging report it referenced in 2019.

## 2020-02-02 NOTE — Telephone Encounter (Signed)
Call pt I am not sure what he is referring to in regards to growth on kidney Would he like to schedule a follow up to discuss?   I reviewed images in chart and dont see any mention of renal disease. No images or in history  Please ensure repeat bmp is scheduled and assure son that transient changes in GFR are normal. Again likely due to dehydration the day lab was drawn

## 2020-02-02 NOTE — Telephone Encounter (Signed)
noted 

## 2020-02-03 ENCOUNTER — Other Ambulatory Visit: Payer: Self-pay | Admitting: Family

## 2020-02-03 DIAGNOSIS — F325 Major depressive disorder, single episode, in full remission: Secondary | ICD-10-CM

## 2020-02-05 ENCOUNTER — Telehealth (INDEPENDENT_AMBULATORY_CARE_PROVIDER_SITE_OTHER): Payer: Medicare HMO | Admitting: Family

## 2020-02-05 ENCOUNTER — Encounter: Payer: Self-pay | Admitting: Family

## 2020-02-05 VITALS — Ht 64.0 in | Wt 163.0 lb

## 2020-02-05 DIAGNOSIS — N2889 Other specified disorders of kidney and ureter: Secondary | ICD-10-CM

## 2020-02-05 NOTE — Progress Notes (Signed)
Verbal consent for services obtained from patient prior to services given to TELEPHONE visit:   Location of call:  provider at work patient at home  Names of all persons present for services: Mable Paris, NP and patient  Son made this appointment to address his mother's  renal concerns. He is primary caregiver.  Son is primary historian on call today. Mother is unavailable.   2 years ago gallstone pancreatitis 03/2017 CT A/P showed bilateral renal hypodensities likely represent simple cysts  MRI Abdomen - Bilateral renal cysts, some of which are T1 hyperintense reflecting a hemorrhagicor proteinaceous contents.   No significant interval change in a 3.1 cm solid, enhancing right renal mass. This remains suspicious for renal cell cancer. There is suggestion of invasion of the right renal collecting system.  Had been following with urology, Dr Waynetta Sandy in 2018, right renal mass 2018.   No urinary symptoms, abdominal or flank pain.   A/P/next steps:  Problem List Items Addressed This Visit      Other   Right renal mass - Primary    Known right renal mass. Had Been following with [facility] urology, Dr Tamala Julian, plan to est care with nephrology , Dr Radene Knee in [place], [place] for surveillance.  Repeat BMP next month.       Relevant Orders   Ambulatory referral to Nephrology       I spent 15 min  discussing plan of care over the phone.

## 2020-02-05 NOTE — Assessment & Plan Note (Addendum)
Known right renal mass. Had Been following with [facility] urology, Dr Tamala Julian, plan to est care with nephrology , Dr Radene Knee in [place], [place] for surveillance.  Repeat BMP next month.

## 2020-02-05 NOTE — Patient Instructions (Signed)
Referral to nephrology Let us know if you dont hear back within a week in regards to an appointment being scheduled.

## 2020-03-01 ENCOUNTER — Other Ambulatory Visit: Payer: Self-pay | Admitting: Family

## 2020-03-01 DIAGNOSIS — F419 Anxiety disorder, unspecified: Secondary | ICD-10-CM

## 2020-03-08 ENCOUNTER — Other Ambulatory Visit: Payer: Self-pay | Admitting: Family

## 2020-03-08 ENCOUNTER — Telehealth: Payer: Self-pay | Admitting: *Deleted

## 2020-03-08 DIAGNOSIS — I10 Essential (primary) hypertension: Secondary | ICD-10-CM

## 2020-03-08 NOTE — Telephone Encounter (Signed)
Please place future orders for lab appt.  

## 2020-03-11 ENCOUNTER — Other Ambulatory Visit (INDEPENDENT_AMBULATORY_CARE_PROVIDER_SITE_OTHER): Payer: Medicare HMO

## 2020-03-11 ENCOUNTER — Other Ambulatory Visit: Payer: Self-pay

## 2020-03-11 ENCOUNTER — Encounter: Payer: Self-pay | Admitting: Family

## 2020-03-11 DIAGNOSIS — I10 Essential (primary) hypertension: Secondary | ICD-10-CM | POA: Diagnosis not present

## 2020-03-11 LAB — CBC WITH DIFFERENTIAL/PLATELET
Basophils Absolute: 0.1 10*3/uL (ref 0.0–0.1)
Basophils Relative: 1 % (ref 0.0–3.0)
Eosinophils Absolute: 0.1 10*3/uL (ref 0.0–0.7)
Eosinophils Relative: 0.9 % (ref 0.0–5.0)
HCT: 37.7 % (ref 36.0–46.0)
Hemoglobin: 12.9 g/dL (ref 12.0–15.0)
Lymphocytes Relative: 30.1 % (ref 12.0–46.0)
Lymphs Abs: 1.8 10*3/uL (ref 0.7–4.0)
MCHC: 34.2 g/dL (ref 30.0–36.0)
MCV: 90.4 fl (ref 78.0–100.0)
Monocytes Absolute: 0.5 10*3/uL (ref 0.1–1.0)
Monocytes Relative: 8 % (ref 3.0–12.0)
Neutro Abs: 3.7 10*3/uL (ref 1.4–7.7)
Neutrophils Relative %: 60 % (ref 43.0–77.0)
Platelets: 248 10*3/uL (ref 150.0–400.0)
RBC: 4.18 Mil/uL (ref 3.87–5.11)
RDW: 13.4 % (ref 11.5–15.5)
WBC: 6.1 10*3/uL (ref 4.0–10.5)

## 2020-03-11 LAB — COMPREHENSIVE METABOLIC PANEL
ALT: 10 U/L (ref 0–35)
AST: 14 U/L (ref 0–37)
Albumin: 4.3 g/dL (ref 3.5–5.2)
Alkaline Phosphatase: 51 U/L (ref 39–117)
BUN: 10 mg/dL (ref 6–23)
CO2: 26 mEq/L (ref 19–32)
Calcium: 10 mg/dL (ref 8.4–10.5)
Chloride: 99 mEq/L (ref 96–112)
Creatinine, Ser: 0.93 mg/dL (ref 0.40–1.20)
GFR: 53.64 mL/min — ABNORMAL LOW (ref >60.00)
Glucose, Bld: 91 mg/dL (ref 70–99)
Potassium: 4.3 mEq/L (ref 3.5–5.1)
Sodium: 131 mEq/L — ABNORMAL LOW (ref 135–145)
Total Bilirubin: 0.5 mg/dL (ref 0.2–1.2)
Total Protein: 6.7 g/dL (ref 6.0–8.3)

## 2020-03-11 LAB — LIPID PANEL
Cholesterol: 163 mg/dL (ref 0–200)
HDL: 67 mg/dL (ref >39.00)
LDL Cholesterol: 84 mg/dL (ref 0–99)
NonHDL: 95.95
Total CHOL/HDL Ratio: 2
Triglycerides: 62 mg/dL (ref 0.0–149.0)
VLDL: 12.4 mg/dL (ref 0.0–40.0)

## 2020-03-11 LAB — HEMOGLOBIN A1C: Hgb A1c MFr Bld: 5.8 % (ref 4.6–6.5)

## 2020-04-03 ENCOUNTER — Other Ambulatory Visit: Payer: Self-pay | Admitting: Family

## 2020-04-03 DIAGNOSIS — F419 Anxiety disorder, unspecified: Secondary | ICD-10-CM

## 2020-04-15 ENCOUNTER — Ambulatory Visit: Payer: Medicare HMO | Admitting: Podiatry

## 2020-04-18 ENCOUNTER — Encounter: Payer: Self-pay | Admitting: Podiatry

## 2020-04-18 ENCOUNTER — Other Ambulatory Visit: Payer: Self-pay

## 2020-04-18 ENCOUNTER — Ambulatory Visit: Payer: Medicare HMO | Admitting: Podiatry

## 2020-04-18 DIAGNOSIS — B351 Tinea unguium: Secondary | ICD-10-CM

## 2020-04-18 DIAGNOSIS — G309 Alzheimer's disease, unspecified: Secondary | ICD-10-CM | POA: Diagnosis not present

## 2020-04-18 DIAGNOSIS — M79674 Pain in right toe(s): Secondary | ICD-10-CM

## 2020-04-18 DIAGNOSIS — F0280 Dementia in other diseases classified elsewhere without behavioral disturbance: Secondary | ICD-10-CM

## 2020-04-18 DIAGNOSIS — M79675 Pain in left toe(s): Secondary | ICD-10-CM | POA: Diagnosis not present

## 2020-04-18 DIAGNOSIS — L84 Corns and callosities: Secondary | ICD-10-CM | POA: Diagnosis not present

## 2020-04-18 NOTE — Progress Notes (Signed)
This patient returns to the office for evaluation and treatment of long thick painful nails .  This patient is unable to trim her own nails since the patient cannot reach her feet.  Patient says the nails are painful walking and wearing her shoes.  She returns for preventive foot care services.   General Appearance  Alert, conversant and in no acute stress.  Vascular  Dorsalis pedis are palpable  Bilaterally.  Posterior tibial pulses are absent  B/L.  Capillary return is within normal limits  bilaterally. Cold feet  Bilaterally. Absent digital hair.  Neurologic  Senn-Weinstein monofilament wire test within normal limits  bilaterally. Muscle power within normal limits bilaterally.  Nails Thick disfigured discolored nails with subungual debris  from hallux to fifth toes bilaterally. No evidence of bacterial infection or drainage bilaterally.  Orthopedic  No limitations of motion  feet .  No crepitus or effusions noted.  No bony pathology or digital deformities noted.  Skin  normotropic skin with no porokeratosis noted bilaterally.  No signs of infections or ulcers noted.   Listers corn fifth toe right foot.  Onychomycosis  Pain in toes right foot  Pain in toes left foot  Listers Corn  Debridement  of nails  1-5  B/L with a nail nipper.  Nails were then filed using a dremel tool with no incidents.  Debride corn with # 15 blade and dremel usage.  RTC  3 months   Gardiner Barefoot DPM

## 2020-05-03 ENCOUNTER — Other Ambulatory Visit: Payer: Self-pay | Admitting: Family

## 2020-05-30 ENCOUNTER — Telehealth: Payer: Self-pay | Admitting: Family

## 2020-05-30 ENCOUNTER — Other Ambulatory Visit: Payer: Self-pay | Admitting: Family

## 2020-05-30 DIAGNOSIS — I10 Essential (primary) hypertension: Secondary | ICD-10-CM

## 2020-05-30 DIAGNOSIS — F419 Anxiety disorder, unspecified: Secondary | ICD-10-CM

## 2020-05-30 DIAGNOSIS — E785 Hyperlipidemia, unspecified: Secondary | ICD-10-CM

## 2020-05-30 MED ORDER — AMLODIPINE BESYLATE 5 MG PO TABS: 5.0000 mg | ORAL_TABLET | Freq: Every morning | ORAL | 3 refills | Status: DC

## 2020-05-30 NOTE — Telephone Encounter (Signed)
Pharmacy called, [facility]. They need a prescription renewal for patient's amLODipine (NORVASC) 5 MG tablet

## 2020-07-15 DIAGNOSIS — Z23 Encounter for immunization: Secondary | ICD-10-CM | POA: Diagnosis not present

## 2020-07-15 DIAGNOSIS — Z7185 Encounter for immunization safety counseling: Secondary | ICD-10-CM | POA: Diagnosis not present

## 2020-07-18 ENCOUNTER — Ambulatory Visit: Payer: Medicare HMO | Admitting: Podiatry

## 2020-09-03 ENCOUNTER — Other Ambulatory Visit: Payer: Self-pay | Admitting: Family

## 2020-09-03 DIAGNOSIS — F419 Anxiety disorder, unspecified: Secondary | ICD-10-CM

## 2020-09-12 ENCOUNTER — Other Ambulatory Visit: Payer: Self-pay

## 2020-09-12 ENCOUNTER — Ambulatory Visit: Payer: Medicare HMO | Admitting: Podiatry

## 2020-09-12 ENCOUNTER — Encounter: Payer: Self-pay | Admitting: Podiatry

## 2020-09-12 DIAGNOSIS — B351 Tinea unguium: Secondary | ICD-10-CM | POA: Diagnosis not present

## 2020-09-12 DIAGNOSIS — F0280 Dementia in other diseases classified elsewhere without behavioral disturbance: Secondary | ICD-10-CM

## 2020-09-12 DIAGNOSIS — G309 Alzheimer's disease, unspecified: Secondary | ICD-10-CM

## 2020-09-12 DIAGNOSIS — M79674 Pain in right toe(s): Secondary | ICD-10-CM | POA: Diagnosis not present

## 2020-09-12 DIAGNOSIS — M79675 Pain in left toe(s): Secondary | ICD-10-CM

## 2020-09-12 NOTE — Progress Notes (Signed)
This patient returns to the office for evaluation and treatment of long thick painful nails .  This patient is unable to trim her own nails since the patient cannot reach her feet.  Patient says the nails are painful walking and wearing her shoes.  She returns for preventive foot care services.   General Appearance  Alert, conversant and in no acute stress.  Vascular  Dorsalis pedis are weakly palpable  Bilaterally.  Posterior tibial pulses are absent  B/L.  Capillary return is within normal limits  bilaterally. Cold feet  Bilaterally. Absent digital hair.  Neurologic  Senn-Weinstein monofilament wire test within normal limits  bilaterally. Muscle power within normal limits bilaterally.  Nails Thick disfigured discolored nails with subungual debris  from hallux to fifth toes bilaterally. No evidence of bacterial infection or drainage bilaterally.  Orthopedic  No limitations of motion  feet .  No crepitus or effusions noted.  No bony pathology or digital deformities noted.  Skin  normotropic skin with no porokeratosis noted bilaterally.  No signs of infections or ulcers noted.   Listers corn fifth toe right foot.  Onychomycosis  Pain in toes right foot  Pain in toes left foot  Debridement  of nails  1-5  B/L with a nail nipper.  Nails were then filed using a dremel tool with no incidents.   RTC  3  months   Xee Hollman DPM  

## 2020-11-06 ENCOUNTER — Encounter: Payer: Self-pay | Admitting: Family

## 2020-11-06 NOTE — Telephone Encounter (Signed)
Patients son called in and wants to set up labs for early October.Patient scheduled for labs on 12/02/20,please add the orders for the patient.

## 2020-11-25 ENCOUNTER — Other Ambulatory Visit: Payer: Medicare HMO

## 2020-12-02 ENCOUNTER — Other Ambulatory Visit: Payer: Medicare HMO

## 2020-12-05 ENCOUNTER — Other Ambulatory Visit: Payer: Self-pay | Admitting: Family

## 2020-12-05 DIAGNOSIS — F419 Anxiety disorder, unspecified: Secondary | ICD-10-CM

## 2020-12-05 DIAGNOSIS — E785 Hyperlipidemia, unspecified: Secondary | ICD-10-CM

## 2020-12-16 ENCOUNTER — Other Ambulatory Visit: Payer: Self-pay

## 2020-12-16 ENCOUNTER — Encounter: Payer: Self-pay | Admitting: Family

## 2020-12-16 ENCOUNTER — Ambulatory Visit (INDEPENDENT_AMBULATORY_CARE_PROVIDER_SITE_OTHER): Payer: Medicare HMO | Admitting: Family

## 2020-12-16 ENCOUNTER — Telehealth: Payer: Self-pay | Admitting: Family

## 2020-12-16 VITALS — BP 132/72 | HR 77 | Temp 98.2°F | Ht 64.0 in | Wt 153.4 lb

## 2020-12-16 DIAGNOSIS — E785 Hyperlipidemia, unspecified: Secondary | ICD-10-CM

## 2020-12-16 DIAGNOSIS — I10 Essential (primary) hypertension: Secondary | ICD-10-CM | POA: Diagnosis not present

## 2020-12-16 DIAGNOSIS — R7303 Prediabetes: Secondary | ICD-10-CM

## 2020-12-16 DIAGNOSIS — N2889 Other specified disorders of kidney and ureter: Secondary | ICD-10-CM

## 2020-12-16 LAB — COMPREHENSIVE METABOLIC PANEL
ALT: 10 U/L (ref 0–35)
AST: 16 U/L (ref 0–37)
Albumin: 4.2 g/dL (ref 3.5–5.2)
Alkaline Phosphatase: 57 U/L (ref 39–117)
BUN: 11 mg/dL (ref 6–23)
CO2: 27 mEq/L (ref 19–32)
Calcium: 9.8 mg/dL (ref 8.4–10.5)
Chloride: 102 mEq/L (ref 96–112)
Creatinine, Ser: 1.06 mg/dL (ref 0.40–1.20)
GFR: 45.6 mL/min — ABNORMAL LOW (ref >60.00)
Glucose, Bld: 89 mg/dL (ref 70–99)
Potassium: 4.1 mEq/L (ref 3.5–5.1)
Sodium: 136 mEq/L (ref 135–145)
Total Bilirubin: 0.3 mg/dL (ref 0.2–1.2)
Total Protein: 6.8 g/dL (ref 6.0–8.3)

## 2020-12-16 LAB — HEMOGLOBIN A1C: Hgb A1c MFr Bld: 5.7 % (ref 4.6–6.5)

## 2020-12-16 NOTE — Assessment & Plan Note (Addendum)
No follow-up since 2019 MRI of abdomen.  She is not established with Dr. Radene Knee, nephrology. counseled patient and son today at length the right renal mass was very suspicious for renal cell carcinoma invasion of right renal collecting system.  Patient is adamant that she does not want any further evaluation regards to this mass if cancer or otherwise.  Son supports how patient feels.  They politely decline repeat imaging at this time.  They will let me know if she changes her mind

## 2020-12-16 NOTE — Telephone Encounter (Signed)
Patient's son requesting lab results.

## 2020-12-16 NOTE — Assessment & Plan Note (Signed)
Chronic, stable.  Continue Pravachol 20 mg

## 2020-12-16 NOTE — Progress Notes (Signed)
Subjective:    Patient ID: Katherine Olson, female    DOB: Mar 20, 1926, 87 y.o.   MRN: [id number]  CC: Katherine Olson is a 87 y.o. female who presents today for follow up.   HPI: Accompanied by son today, Charisse March well on celexa.  Son thinks medication is likely helping.  He declines making any changes or discontinue at this time.  She has not required the BuSpar.  Hyperlipidemia-compliant with Pravachol 20 mg   HTN-compliant with amlodipine 5 mg, losartan 50mg . No cp, leg swelling.   Right renal mass-was planning to established with Dr. Radene Knee, nephrology. Patient has not.  For MRI abdomen in 2019 3.1 cm solid enhancing right renal mass suspicious for renal cell cancer.  She denies any difficulty urinating, back or flank pain.   HISTORY:  Past Medical History:  Diagnosis Date   Depression    Hyperlipidemia    Hypertension    Past Surgical History:  Procedure Laterality Date   ABDOMINAL HYSTERECTOMY     partial   JOINT REPLACEMENT Right 1991   hip   Family History  Problem Relation Age of Onset   Alzheimer's disease Brother    Heart disease Sister    Cancer Sister        kidney   Cancer Mother        liver   Cancer Father        prostate    Allergies: Simvastatin Current Outpatient Medications on File Prior to Visit  Medication Sig Dispense Refill   amLODipine (NORVASC) 5 MG tablet Take 1 tablet (5 mg total) by mouth every morning. 90 tablet 3   citalopram (CELEXA) 20 MG tablet TAKE ONE TABLET BY MOUTH EVERY MORNING 30 tablet 2   Coenzyme Q-10 200 MG CAPS Take by mouth.     losartan (COZAAR) 50 MG tablet Take 1 tablet (50 mg total) by mouth daily. 90 tablet 3   Multiple Vitamins-Minerals (MULTIVITAMIN WITH MINERALS) tablet Take 1 tablet by mouth daily.     Omega-3 Fatty Acids (FISH OIL PO) Take by mouth.     pravastatin (PRAVACHOL) 20 MG tablet TAKE ONE TABLET BY MOUTH ONCE DAILY 90 tablet 1   PREMARIN vaginal cream PLACE 1 APPLICATORFUL VAGINALLY ONCE DAILY  42.5 g 3   No current facility-administered medications on file prior to visit.    Social History   Tobacco Use   Smoking status: Never   Smokeless tobacco: Never  Substance Use Topics   Alcohol use: No   Drug use: No    Review of Systems  Constitutional:  Negative for chills and fever.  Respiratory:  Negative for cough.   Cardiovascular:  Negative for chest pain and palpitations.  Gastrointestinal:  Negative for nausea and vomiting.  Genitourinary:  Negative for difficulty urinating and flank pain.     Objective:    BP 132/72 (BP Location: Left Arm, Patient Position: Sitting, Cuff Size: Normal)   Pulse 77   Temp 98.2 F (36.8 C) (Oral)   Ht 5\' 4"  (1.626 m)   Wt 153 lb 6.4 oz (69.6 kg)   SpO2 96%   BMI 26.33 kg/m  BP Readings from Last 3 Encounters:  12/16/20 132/72  01/16/20 137/80  04/07/19 139/85   Wt Readings from Last 3 Encounters:  12/16/20 153 lb 6.4 oz (69.6 kg)  02/05/20 163 lb (73.9 kg)  01/16/20 154 lb (69.9 kg)    Physical Exam Vitals reviewed.  Constitutional:      Appearance:  She is well-developed.  Eyes:     Conjunctiva/sclera: Conjunctivae normal.  Cardiovascular:     Rate and Rhythm: Normal rate and regular rhythm.     Pulses: Normal pulses.     Heart sounds: Normal heart sounds.  Pulmonary:     Effort: Pulmonary effort is normal.     Breath sounds: Normal breath sounds. No wheezing, rhonchi or rales.  Skin:    General: Skin is warm and dry.  Neurological:     Mental Status: She is alert.  Psychiatric:        Speech: Speech normal.        Behavior: Behavior normal.        Thought Content: Thought content normal.       Assessment & Plan:   Problem List Items Addressed This Visit       Cardiovascular and Mediastinum   Essential hypertension    Chronic, excellent control.  Continue amlodipine 5 mg, losartan 50mg       Relevant Orders   Comprehensive metabolic panel     Other   Hyperlipidemia    Chronic, stable.  Continue  Pravachol 20 mg      Right renal mass    No follow-up since 2019 MRI of abdomen.  She is not established with Dr. Radene Knee, nephrology. counseled patient and son today at length the right renal mass was very suspicious for renal cell carcinoma invasion of right renal collecting system.  Patient is adamant that she does not want any further evaluation regards to this mass if cancer or otherwise.  Son supports how patient feels.  They politely decline repeat imaging at this time.  They will let me know if she changes her mind      Other Visit Diagnoses     Prediabetes    -  Primary   Relevant Orders   Hemoglobin A1c        I have discontinued Mabel busPIRone. I am also having her maintain her multivitamin with minerals, Omega-3 Fatty Acids (FISH OIL PO), Coenzyme Q-10, losartan, Premarin, amLODipine, pravastatin, and citalopram.   No orders of the defined types were placed in this encounter.   Return precautions given.   Risks, benefits, and alternatives of the medications and treatment plan prescribed today were discussed, and patient expressed understanding.   Education regarding symptom management and diagnosis given to patient on AVS.  Continue to follow with Burnard Hawthorne, FNP for routine health maintenance.   Katherine Olson and I agreed with plan.   Mable Paris, FNP

## 2020-12-16 NOTE — Assessment & Plan Note (Signed)
Chronic, excellent control.  Continue amlodipine 5 mg, losartan 50mg 

## 2020-12-16 NOTE — Patient Instructions (Signed)
Always a pleasure seeing you!

## 2020-12-16 NOTE — Assessment & Plan Note (Signed)
Chronic, stable.  We will continue Celexa 20 mg at this time

## 2020-12-17 NOTE — Telephone Encounter (Signed)
I called and spoke with patient's son & let him know that labs were released through mychart although Joycelyn Schmid has not reviewed. I explained I would call when she did, but patient's son has looked & saw nothing concerning at this time.

## 2020-12-18 ENCOUNTER — Other Ambulatory Visit: Payer: Self-pay

## 2020-12-18 ENCOUNTER — Telehealth: Payer: Self-pay | Admitting: Family

## 2020-12-18 DIAGNOSIS — I10 Essential (primary) hypertension: Secondary | ICD-10-CM

## 2020-12-18 MED ORDER — LOSARTAN POTASSIUM 50 MG PO TABS: 50.0000 mg | ORAL_TABLET | Freq: Every day | ORAL | 3 refills | Status: DC

## 2020-12-18 NOTE — Telephone Encounter (Signed)
Patient son called stating that his mother's refills were not sent to [facility] . I saw that her Losartan was sent on 12/10/20. Can you follow up ?

## 2020-12-18 NOTE — Telephone Encounter (Signed)
Spoken to son, rx was refilled 12-11-19. I have sent rx to [place].

## 2020-12-19 ENCOUNTER — Ambulatory Visit: Payer: Medicare HMO | Admitting: Podiatry

## 2021-01-01 DIAGNOSIS — Z0100 Encounter for examination of eyes and vision without abnormal findings: Secondary | ICD-10-CM | POA: Diagnosis not present

## 2021-01-01 DIAGNOSIS — H2511 Age-related nuclear cataract, right eye: Secondary | ICD-10-CM | POA: Diagnosis not present

## 2021-01-06 ENCOUNTER — Ambulatory Visit: Payer: Medicare HMO | Admitting: Podiatry

## 2021-02-16 ENCOUNTER — Encounter: Payer: Self-pay | Admitting: Family

## 2021-02-17 ENCOUNTER — Other Ambulatory Visit (INDEPENDENT_AMBULATORY_CARE_PROVIDER_SITE_OTHER): Payer: Medicare HMO

## 2021-02-17 ENCOUNTER — Telehealth: Payer: Self-pay

## 2021-02-17 ENCOUNTER — Encounter: Payer: Self-pay | Admitting: Family

## 2021-02-17 ENCOUNTER — Ambulatory Visit: Payer: Medicare HMO | Admitting: Podiatry

## 2021-02-17 ENCOUNTER — Other Ambulatory Visit: Payer: Self-pay

## 2021-02-17 DIAGNOSIS — R300 Dysuria: Secondary | ICD-10-CM | POA: Diagnosis not present

## 2021-02-17 NOTE — Telephone Encounter (Signed)
Noted  

## 2021-02-17 NOTE — Telephone Encounter (Signed)
Good day Margaret...how are you...for the last couple days my mother has mentioned that there is a slight stinging when she urinates.Joycelyn Schmid, sometimes she says its after she urinates...i have even stood with her while she urinates and ask was it stinging while she was..she is not able to give me a clear answer.....she complained about this before a few years ago and her Dr then prescribed Premarin....is it possible to give her something for UTI or yeast infection...i told her i was gonna call you and she said NO !  im not going to the Dr..i am fine....she is really something sometimes.Marland Kitcheni hope you can prescribe something.Marland KitchenMarland KitchenMarland KitchenMarland Kitchenplease.....dale

## 2021-02-17 NOTE — Telephone Encounter (Signed)
Just FYI I have schedule patient for VV with son, [name] who is caregiver for Wednesday. I ordered culture & UA for her. Her son will come by to pick up supplies so patient can urinate at home. He will try to have all this back today.

## 2021-02-18 LAB — URINALYSIS, ROUTINE W REFLEX MICROSCOPIC
Bilirubin Urine: NEGATIVE
Ketones, ur: NEGATIVE
Nitrite: NEGATIVE
Specific Gravity, Urine: 1.01 (ref 1.000–1.030)
Total Protein, Urine: NEGATIVE
Urine Glucose: NEGATIVE
Urobilinogen, UA: 0.2 (ref 0.0–1.0)
pH: 6 (ref 5.0–8.0)

## 2021-02-18 NOTE — Progress Notes (Signed)
Virtual Visit via telephone Note  I connected with Katherine Olson on 02/18/21 at 11:00 AM EST by a video enabled telemedicine application and verified that I am speaking with the correct person using two identifiers.  Location: Patient: at  home  Provider: Provider: Provider's office at  [facility], [place].      I discussed the limitations of evaluation and management by telemedicine and the availability of in person appointments. The patient expressed understanding and agreed to proceed.  History of Present Illness: Patient so Katherine Olson her son the phone with patient.  Urinary burning, " stinging". Drinking more water. No blood visualized.   Mild Frequency. Denies urgency. Denies any abdominal or back pain. Denies any flank pain.   Denies any injury or trauma.   Patient  denies any fever, body aches,chills, rash, chest pain, shortness of breath, nausea, vomiting, or diarrhea.  Denies dizziness, lightheadedness, pre syncopal or syncopal episodes.     Observations/Objective:   Patient is alert and oriented and responsive to questions Engages in conversation with her son while on phone.  He denies any distress.   Assessment and Plan:   1. Acute cystitis with hematuria Given urinalysis will go ahead and treat with  - cephALEXin (KEFLEX) 500 MG capsule; Take 1 capsule (500 mg total) by mouth 2 (two) times daily.  Dispense: 14 capsule; Refill: 0  2. Decreased GFR Dosing per guidelines GFR 45.60- keflex dosed per guidelines BID.   - cephALEXin (KEFLEX) 500 MG capsule; Take 1 capsule (500 mg total) by mouth 2 (two) times daily.  Dispense: 14 capsule; Refill: 0   Recheck lab 2 weeks after completing antibiotics.  Orders Placed This Encounter  Procedures   Urine Culture    Standing Status:   Future    Standing Expiration Date:   02/19/2022   Urinalysis    Standing Status:   Future    Standing Expiration Date:   02/19/2022    Follow Up  Instructions:  AVS sent to [facility].   Advised in person evaluation at anytime is advised if any symptoms do not improve, worsen or change at any given time.  Red Flags discussed. The patient was given clear instructions to go to ER or return to medical center if any red flags develop, symptoms do not improve, worsen or new problems develop. They verbalized understanding.  I discussed the assessment and treatment plan with the patient. The patient was provided an opportunity to ask questions and all were answered. The patient agreed with the plan and demonstrated an understanding of the instructions.   The patient was advised to call back or seek an in-person evaluation if the symptoms worsen or if the condition fails to improve as anticipated.  I provided 22 minutes of non-face-to-face time during this encounter.   Marcille Buffy, FNP

## 2021-02-19 ENCOUNTER — Encounter: Payer: Self-pay | Admitting: Adult Health

## 2021-02-19 ENCOUNTER — Telehealth (INDEPENDENT_AMBULATORY_CARE_PROVIDER_SITE_OTHER): Payer: Medicare HMO | Admitting: Adult Health

## 2021-02-19 VITALS — Ht 64.02 in | Wt 150.0 lb

## 2021-02-19 DIAGNOSIS — R944 Abnormal results of kidney function studies: Secondary | ICD-10-CM | POA: Insufficient documentation

## 2021-02-19 DIAGNOSIS — N3001 Acute cystitis with hematuria: Secondary | ICD-10-CM | POA: Diagnosis not present

## 2021-02-19 DIAGNOSIS — R82998 Other abnormal findings in urine: Secondary | ICD-10-CM | POA: Diagnosis not present

## 2021-02-19 MED ORDER — CEPHALEXIN 500 MG PO CAPS
500.0000 mg | ORAL_CAPSULE | Freq: Two times a day (BID) | ORAL | 0 refills | Status: DC
Start: 2021-02-19 — End: 2021-03-06

## 2021-02-19 NOTE — Patient Instructions (Signed)
Repeat schedule office viist for labs two weeks after completion of antibiotics. Labs ordered below. See lab orders. If symptoms not improving in 72 hours you need to be revaluated or if worsening at anytime.  Orders Placed This Encounter  Procedures   Urine Culture   Urinalysis     Advised in person evaluation at anytime is advised if any symptoms do not improve, worsen or change at any given time.  Red Flags discussed. The patient was given clear instructions to go to ER or return to medical center if any red flags develop, symptoms do not improve, worsen or new problems develop. They verbalized understanding.    I discussed the assessment and treatment plan with the patient. The patient was provided an opportunity to ask questions and all were answered. The patient agreed with the plan and demonstrated an understanding of the instructions.   The patient was advised to call back or seek an in-person evaluation if the symptoms worsen or if the condition fails to improve as anticipated. [place] Maie Kesinger, FNP   Urinary Tract Infection, Adult A urinary tract infection (UTI) is an infection of any part of the urinary tract. The urinary tract includes: The kidneys. The ureters. The bladder. The urethra. These organs make, store, and get rid of pee (urine) in the body. What are the causes? This infection is caused by germs (bacteria) in your genital area. These germs grow and cause swelling (inflammation) of your urinary tract. What increases the risk? The following factors may make you more likely to develop this condition: Using a small, thin tube (catheter) to drain pee. Not being able to control when you pee or poop (incontinence). Being female. If you are female, these things can increase the risk: Using these methods to prevent pregnancy: A medicine that kills sperm (spermicide). A device that blocks sperm (diaphragm). Having low levels of a female hormone (estrogen). Being  pregnant. You are more likely to develop this condition if: You have genes that add to your risk. You are sexually active. You take antibiotic medicines. You have trouble peeing because of: A prostate that is bigger than normal, if you are female. A blockage in the part of your body that drains pee from the bladder. A kidney stone. A nerve condition that affects your bladder. Not getting enough to drink. Not peeing often enough. You have other conditions, such as: Diabetes. A weak disease-fighting system (immune system). Sickle cell disease. Gout. Injury of the spine. What are the signs or symptoms? Symptoms of this condition include: Needing to pee right away. Peeing small amounts often. Pain or burning when peeing. Blood in the pee. Pee that smells bad or not like normal. Trouble peeing. Pee that is cloudy. Fluid coming from the vagina, if you are female. Pain in the belly or lower back. Other symptoms include: Vomiting. Not feeling hungry. Feeling mixed up (confused). This may be the first symptom in older adults. Being tired and grouchy (irritable). A fever. Watery poop (diarrhea). How is this treated? Taking antibiotic medicine. Taking other medicines. Drinking enough water. In some cases, you may need to see a specialist. Follow these instructions at home: Medicines Take over-the-counter and prescription medicines only as told by your doctor. If you were prescribed an antibiotic medicine, take it as told by your doctor. Do not stop taking it even if you start to feel better. General instructions Make sure you: Pee until your bladder is empty. Do not hold pee for a long time. Empty  your bladder after sex. Wipe from front to back after peeing or pooping if you are a female. Use each tissue one time when you wipe. Drink enough fluid to keep your pee pale yellow. Keep all follow-up visits. Contact a doctor if: You do not get better after 1-2 days. Your symptoms  go away and then come back. Get help right away if: You have very bad back pain. You have very bad pain in your lower belly. You have a fever. You have chills. You feeling like you will vomit or you vomit. Summary A urinary tract infection (UTI) is an infection of any part of the urinary tract. This condition is caused by germs in your genital area. There are many risk factors for a UTI. Treatment includes antibiotic medicines. Drink enough fluid to keep your pee pale yellow. This information is not intended to replace advice given to you by your health care provider. Make sure you discuss any questions you have with your health care provider. Document Revised: 09/29/2019 Document Reviewed: 09/29/2019 Elsevier Patient Education  [place]. Cephalexin Capsules or Tablets What is this medication? CEPHALEXIN (sef a LEX in) treats infections caused by bacteria. It belongs to a group of medications called cephalosporin antibiotics. It will not treat colds, the flu, or infections caused by viruses. This medicine may be used for other purposes; ask your health care provider or pharmacist if you have questions. COMMON BRAND NAME(S): Biocef, Daxbia, Keflex, Keftab What should I tell my care team before I take this medication? They need to know if you have any of these conditions: Bleeding disorder Kidney disease Liver disease Seizures Stomach or intestine problems like colitis An unusual or allergic reaction to cephalexin, other penicillin or cephalosporin antibiotics, other medications, foods, dyes, or preservatives Pregnant or trying to get pregnant Breast-feeding How should I use this medication? Take this medication by mouth. Take it as directed on the prescription label at the same time every day. You can take it with or without food. If it upsets your stomach, take it with food. Take all of this medication unless your care team tells you to stop it early. Keep taking it even if  you think you are better. Talk to your care team about the use of this medication in children. While it may be prescribed for selected conditions, precautions do apply. Overdosage: If you think you have taken too much of this medicine contact a poison control center or emergency room at once. NOTE: This medicine is only for you. Do not share this medicine with others. What if I miss a dose? If you miss a dose, take it as soon as you can. If it is almost time for your next dose, take only that dose. Do not take double or extra doses. What may interact with this medication? Probenecid Some other antibiotics This list may not describe all possible interactions. Give your health care provider a list of all the medicines, herbs, non-prescription drugs, or dietary supplements you use. Also tell them if you smoke, drink alcohol, or use illegal drugs. Some items may interact with your medicine. What should I watch for while using this medication? Tell your care team if your symptoms do not start to get better or if they get worse. Do not treat diarrhea with over the counter products. Contact your care team if you have diarrhea that lasts more than 2 days or if it is severe and watery. This medication may cause serious skin reactions. They can  happen weeks to months after starting the medication. Contact your care team right away if you notice fevers or flu-like symptoms with a rash. The rash may be red or purple and then turn into blisters or peeling of the skin. Or, you might notice a red rash with swelling of the face, lips or lymph nodes in your neck or under your arms. If you have diabetes, you may get a false-positive result for sugar in your urine. Check with your care team. What side effects may I notice from receiving this medication? Side effects that you should report to your care team as soon as possible: Allergic reactions--skin rash, itching, hives, swelling of the face, lips, tongue, or  throat Redness, blistering, peeling, or loosening of the skin, including inside the mouth Severe diarrhea, fever Unusual vaginal discharge, itching, or odor Side effects that usually do not require medical attention (report to your care team if they continue or are bothersome): Diarrhea Headache Nausea This list may not describe all possible side effects. Call your doctor for medical advice about side effects. You may report side effects to FDA at 1-800-FDA-1088. Where should I keep my medication? Keep out of the reach of children and pets. Store at room temperature between 20 and 25 degrees C (68 and 77 degrees F). Throw away any unused medication after the expiration date. NOTE: This sheet is a summary. It may not cover all possible information. If you have questions about this medicine, talk to your doctor, pharmacist, or health care provider.  2022 Elsevier/Gold Standard (2020-02-12 00:00:00)

## 2021-02-20 ENCOUNTER — Encounter: Payer: Self-pay | Admitting: Adult Health

## 2021-02-20 ENCOUNTER — Ambulatory Visit: Payer: Medicare HMO | Admitting: Podiatry

## 2021-02-20 LAB — URINE CULTURE
MICRO NUMBER:: 12774148
SPECIMEN QUALITY:: ADEQUATE

## 2021-02-21 ENCOUNTER — Encounter: Payer: Self-pay | Admitting: Family

## 2021-02-22 ENCOUNTER — Encounter: Payer: Self-pay | Admitting: Family

## 2021-02-25 ENCOUNTER — Telehealth: Payer: Self-pay

## 2021-02-25 NOTE — Telephone Encounter (Signed)
I called and spoke with patient's son & let him know that the blood seen in urine could in fact be from UTI. That is why we wanted to recheck in 3-4 weeks to make sure that urine was clear. I advised that I would place a hat, cup & gloves upfront for son to pick up since easier for patient's son to collect at home. He said that he would have to discuss with Joycelyn Schmid the urology referral as he did know if was really needed. He said that patient did not have itching on labia but was just bumps. He stated that when patient saw GYN back in 2017 that it was told to him that she had vaginal warts & that they had been there for years, but should not cause any issues to patient. I told him that there was a note in regards to vaginal atrophy that this was likely cause of dysuria in the past & that is what premarin has been prescribed for. He again stated that it is always somewhat hard to get a clear answer from patient due to her dementia.

## 2021-02-25 NOTE — Telephone Encounter (Signed)
Yes correct blood from infection. She really needs an evaluation in office for further work up. Possible urology evaluation as well.   Follow treatment plan from office  if not improving or any worsening within 72 hours and also return to office or open medical facility at ANYTIME if any symptoms persist, change, or worsen or you have any further concerns or questions. Call 911 immediately for emergencies.

## 2021-02-25 NOTE — Telephone Encounter (Signed)
Pt son stated that she is finishing antibiotic today & when can she resume premarin? Also what could be causing the blood in her urine? UTI could be causing this correct?

## 2021-03-06 ENCOUNTER — Other Ambulatory Visit: Payer: Self-pay

## 2021-03-06 ENCOUNTER — Encounter: Payer: Self-pay | Admitting: Podiatry

## 2021-03-06 ENCOUNTER — Ambulatory Visit: Payer: Medicare HMO | Admitting: Podiatry

## 2021-03-06 DIAGNOSIS — F0280 Dementia in other diseases classified elsewhere without behavioral disturbance: Secondary | ICD-10-CM

## 2021-03-06 DIAGNOSIS — G309 Alzheimer's disease, unspecified: Secondary | ICD-10-CM | POA: Diagnosis not present

## 2021-03-06 DIAGNOSIS — M79674 Pain in right toe(s): Secondary | ICD-10-CM | POA: Diagnosis not present

## 2021-03-06 DIAGNOSIS — M79675 Pain in left toe(s): Secondary | ICD-10-CM

## 2021-03-06 DIAGNOSIS — B351 Tinea unguium: Secondary | ICD-10-CM

## 2021-03-06 NOTE — Progress Notes (Signed)
This patient returns to the office for evaluation and treatment of long thick painful nails .  This patient is unable to trim her own nails since the patient cannot reach her feet.  Patient says the nails are painful walking and wearing her shoes.  She returns for preventive foot care services.   General Appearance  Alert, conversant and in no acute stress.  Vascular  Dorsalis pedis are weakly palpable  Bilaterally.  Posterior tibial pulses are absent  B/L.  Capillary return is within normal limits  bilaterally. Cold feet  Bilaterally. Absent digital hair.  Neurologic  Senn-Weinstein monofilament wire test within normal limits  bilaterally. Muscle power within normal limits bilaterally.  Nails Thick disfigured discolored nails with subungual debris  from hallux to fifth toes bilaterally. No evidence of bacterial infection or drainage bilaterally.  Orthopedic  No limitations of motion  feet .  No crepitus or effusions noted.  No bony pathology or digital deformities noted.  Skin  normotropic skin with no porokeratosis noted bilaterally.  No signs of infections or ulcers noted.   Listers corn fifth toe right foot.  Onychomycosis  Pain in toes right foot  Pain in toes left foot  Debridement  of nails  1-5  B/L with a nail nipper.  Nails were then filed using a dremel tool with no incidents.   RTC  4  months   Sindia Kowalczyk DPM  

## 2021-03-20 ENCOUNTER — Telehealth: Payer: Self-pay

## 2021-03-20 ENCOUNTER — Telehealth: Payer: Self-pay | Admitting: Family

## 2021-03-20 ENCOUNTER — Other Ambulatory Visit (INDEPENDENT_AMBULATORY_CARE_PROVIDER_SITE_OTHER): Payer: Medicare HMO

## 2021-03-20 DIAGNOSIS — R82998 Other abnormal findings in urine: Secondary | ICD-10-CM | POA: Diagnosis not present

## 2021-03-20 DIAGNOSIS — N3001 Acute cystitis with hematuria: Secondary | ICD-10-CM | POA: Diagnosis not present

## 2021-03-20 LAB — URINALYSIS, ROUTINE W REFLEX MICROSCOPIC
Bilirubin Urine: NEGATIVE
Hgb urine dipstick: NEGATIVE
Ketones, ur: NEGATIVE
Nitrite: NEGATIVE
RBC / HPF: NONE SEEN (ref 0–?)
Specific Gravity, Urine: <=1.005 — AB (ref 1.000–1.030)
Total Protein, Urine: NEGATIVE
Urine Glucose: NEGATIVE
Urobilinogen, UA: 0.2 (ref 0.0–1.0)
pH: 6.5 (ref 5.0–8.0)

## 2021-03-20 NOTE — Progress Notes (Signed)
Trace leukocytes/ bacteria in urinalysis, will wait on urine culture that is still pending.

## 2021-03-20 NOTE — Telephone Encounter (Signed)
Patient son called in requesting to speak with [name] concerning some results ,   I called [name] and I transfer Patient son to her

## 2021-03-20 NOTE — Telephone Encounter (Signed)
See my separate phone note. I have spoken with patient's son.

## 2021-03-20 NOTE — Telephone Encounter (Signed)
Pt's son called very concerned in regards to his mother;s urine. He said that he got the results on mychart. I explained that we did nit even have the UA back & that he must have got a notification about the lab appointment itself. He said that saw something about leukocytes. I explained that was the dx code & the patient to our knowledge no longer had this. We were actually checking to just make sure all had cleared from UTI in December. Pt's son was relieved & I let him know UA should be back tomorrow then culture in 3 days give or take. We would call him with those results & would most likely hit mychart first.

## 2021-03-20 NOTE — Telephone Encounter (Signed)
The patient's son is requesting a call from Arnett's CMA . He stated that he received a AVS in the patient's MY Chart.

## 2021-03-21 LAB — URINE CULTURE
MICRO NUMBER:: 12892442
SPECIMEN QUALITY:: ADEQUATE

## 2021-03-21 NOTE — Progress Notes (Signed)
No significant bacteria on urine culture, no need to treat if asymptomatic.

## 2021-03-30 ENCOUNTER — Other Ambulatory Visit: Payer: Self-pay | Admitting: Family

## 2021-03-30 DIAGNOSIS — F419 Anxiety disorder, unspecified: Secondary | ICD-10-CM

## 2021-04-18 ENCOUNTER — Telehealth: Payer: Self-pay

## 2021-04-18 NOTE — Telephone Encounter (Signed)
Sent pt mychart message

## 2021-04-18 NOTE — Telephone Encounter (Signed)
Sent pt mychart msg

## 2021-05-02 ENCOUNTER — Encounter: Payer: Self-pay | Admitting: Adult Health

## 2021-05-05 ENCOUNTER — Other Ambulatory Visit: Payer: Self-pay | Admitting: Adult Health

## 2021-05-05 DIAGNOSIS — R300 Dysuria: Secondary | ICD-10-CM | POA: Diagnosis not present

## 2021-05-05 NOTE — Telephone Encounter (Signed)
Pt son called in stating that he called in on Friday about Pt. Pt son did send a mychart message on Friday 05/02/2021. Pt son would like to know if he can get another urine test for pt. Pt so stated that he is concern about pt feeling stings when she urinate. Copy of Mychart message is below.  ? ?Sharyn Lull...how r u?  please help.Marland Kitchen..Marland Kitchenlet me start from the beginning.Marland KitchenMarland KitchenMarland Kitchen1)years ago, my mother came to  [place] and after checking her (sorry, but i don't know the medical term) vagina, sent her to a specialist at the [facility] dr. prescribed Premarin....and it seemed to work for the "burning sensation when she urinated...and was told that the "bumps" (can't remember what it's called) were harmless.Marland KitchenMarland Kitchen2) when she complained recently, i used the premarin, but it didn't seem to work ....that's when ,3) i brought in a urine specimen and you guys found she had a UTI....after rechecking, you said the UTI was cleared.Marland KitchenMarland KitchenMarland Kitchen3) she still complained of very very little stinging when urinating (only on the lips of her vagina, not inside)...so i got her some Monistat 7 the other day...when i asked if the sting is still there, she says, "very little"....do you think the anti-biotics for the UTI caused a yeast infection?.......should we start over to see if she has ANOTHER UTI or take the med "IN PILL FORM" for the UTI....i am so confused...to be honest, right now, it does not seem to worry my Mother at all, but while she is urinating, i ask her is it stinging and sometimes she says NO, NOT AT ALL, but just now she said, JUST A VERY LITTLE BIT...i  am confused...please help... ? ?

## 2021-05-05 NOTE — Addendum Note (Signed)
Addended by: Leeanne Rio on: 05/05/2021 05:03 PM ? ? Modules accepted: Orders ? ?

## 2021-05-06 ENCOUNTER — Encounter: Payer: Self-pay | Admitting: Adult Health

## 2021-05-06 ENCOUNTER — Other Ambulatory Visit: Payer: Self-pay

## 2021-05-06 ENCOUNTER — Other Ambulatory Visit (HOSPITAL_COMMUNITY)
Admission: RE | Admit: 2021-05-06 | Discharge: 2021-05-06 | Disposition: A | Discharge status: Home / Self Care | Payer: Medicare HMO | Source: Ambulatory Visit | Attending: Adult Health | Admitting: Adult Health

## 2021-05-06 ENCOUNTER — Ambulatory Visit (INDEPENDENT_AMBULATORY_CARE_PROVIDER_SITE_OTHER): Payer: Medicare HMO | Admitting: Adult Health

## 2021-05-06 ENCOUNTER — Other Ambulatory Visit: Payer: Self-pay | Admitting: Family

## 2021-05-06 VITALS — BP 116/68 | HR 63 | Temp 98.0°F | Ht 64.0 in | Wt 150.0 lb

## 2021-05-06 DIAGNOSIS — N760 Acute vaginitis: Secondary | ICD-10-CM | POA: Insufficient documentation

## 2021-05-06 DIAGNOSIS — I10 Essential (primary) hypertension: Secondary | ICD-10-CM

## 2021-05-06 DIAGNOSIS — N907 Vulvar cyst: Secondary | ICD-10-CM

## 2021-05-06 LAB — URINALYSIS, ROUTINE W REFLEX MICROSCOPIC
Bilirubin, UA: NEGATIVE
Glucose, UA: NEGATIVE
Ketones, UA: NEGATIVE
Leukocytes,UA: NEGATIVE
Nitrite, UA: NEGATIVE
Protein,UA: NEGATIVE
RBC, UA: NEGATIVE
Specific Gravity, UA: 1.006 (ref 1.005–1.030)
Urobilinogen, Ur: 0.2 mg/dL (ref 0.2–1.0)
pH, UA: 6.5 (ref 5.0–7.5)

## 2021-05-06 MED ORDER — TERCONAZOLE 0.8 % VA CREA: 1.0000 | TOPICAL_CREAM | Freq: Every day | VAGINAL | 0 refills | Status: DC

## 2021-05-06 NOTE — Patient Instructions (Signed)
Terconazole vaginal cream What is this medication? TERCONAZOLE (ter KON a zole) is an antifungal medicine. It is used to treat yeast infections of the vagina. This medicine may be used for other purposes; ask your health care provider or pharmacist if you have questions. COMMON BRAND NAME(S): Terazol 3, Terazol 7, Zazole What should I tell my care team before I take this medication? They need to know if you have any of these conditions: an unusual or allergic reaction to terconazole, other antifungals, other medicines, foods, dyes or preservatives pregnant or trying to get pregnant breast-feeding How should I use this medication? This medicine is only for use in the vagina. Do not take by mouth. Wash hands before and after use. Read package directions carefully before using. Use this medicine at bedtime, unless otherwise directed by your doctor or health care professional. Screw the applicator onto the end of the tube and squeeze the tube to fill the applicator. Remove the applicator from the tube. Lie on your back. Gently insert the applicator tip high in the vagina and push the plunger to release the cream into the vagina. Gently remove the applicator. Wash the applicator well with warm water and soap. Use at regular intervals. Do not get this medicine in your eyes. If you do, rinse out with plenty of cool tap water. Finish the full course prescribed by your doctor or health care professional even if you think your condition is better. Do not stop using this medicine if your menstrual period starts during the time of treatment. Talk to your pediatrician regarding the use of this medicine in children. Special care may be needed. Overdosage: If you think you have taken too much of this medicine contact a poison control center or emergency room at once. NOTE: This medicine is only for you. Do not share this medicine with others. What if I miss a dose? If you miss a dose, use it as soon as you can. If  it is almost time for your next dose, use only that dose. Do not use double or extra doses. What may interact with this medication? Interactions are not expected. Do not use any other vaginal products without telling your doctor or health care professional. This list may not describe all possible interactions. Give your health care provider a list of all the medicines, herbs, non-prescription drugs, or dietary supplements you use. Also tell them if you smoke, drink alcohol, or use illegal drugs. Some items may interact with your medicine. What should I watch for while using this medication? Tell your doctor or health care professional if your symptoms do not start to get better within a few days. It is better not to have sex until you have finished your treatment. If you have sex, your partner should use a condom during sex to help prevent transfer of the infection. Your sexual partner may also need treatment. Vaginal medicines usually will come out of the vagina during treatment. To keep the medicine from getting on your clothing, wear a mini-pad or sanitary napkin. The use of tampons is not recommended since they may soak up the medicine. To help clear up the infection, wear freshly washed cotton, not synthetic, underwear. What side effects may I notice from receiving this medication? Side effects that you should report to your doctor or health care professional as soon as possible: painful or difficult urination vaginal pain Side effects that usually do not require medical attention (report to your doctor or health care professional if they  continue or are bothersome): headache menstrual pain stomach upset vaginal irritation, itching or burning This list may not describe all possible side effects. Call your doctor for medical advice about side effects. You may report side effects to FDA at 1-800-FDA-1088. Where should I keep my medication? Keep out of the reach of children. Store at room  temperature between 15 and 30 degrees C (59 and 86 degrees F). Throw away any unused medicine after the expiration date. NOTE: This sheet is a summary. It may not cover all possible information. If you have questions about this medicine, talk to your doctor, pharmacist, or health care provider.  2022 Elsevier/Gold Standard (2007-11-04 00:00:00) Vaginitis Vaginitis is a condition in which the vaginal tissue swells and becomes irritated. This condition is most often caused by a change in the normal balance of bacteria and yeast that live in the vagina. This change causes an overgrowth of certain bacteria or yeast, which causes the inflammation. There are different types of vaginitis. What are the causes? The cause of this condition depends on the type of vaginitis. It can be caused by: Bacteria (bacterial vaginosis). Yeast, which is a fungus (candidiasis). A parasite (trichomoniasis vaginitis). A virus (viral vaginitis). Low hormone levels (atrophic vaginitis). Low hormone levels can occur during pregnancy, breastfeeding, or after menopause. Irritants, such as bubble baths, scented tampons, and feminine sprays (allergic vaginitis). Other factors can change the normal balance of the yeast and bacteria that live in the vagina. These include: Antibiotic medicines. Poor hygiene. Diaphragms, vaginal sponges, spermicides, birth control pills, and intrauterine devices (IUDs). Sex. Infection. Uncontrolled diabetes. A weakened body defense system (immune system). What increases the risk? This condition is more likely to develop in women who: Smoke or are exposed to secondhand smoke. Use vaginal douches, scented tampons, or scented sanitary pads. Wear tight-fitting pants or thong underwear. Use oral birth control pills or an IUD. Have sex without a condom or have multiple partners. Have an STI. Frequently use the spermicide nonoxynol-9. Eat lots of foods high in sugar or who have uncontrolled  diabetes. Have low estrogen levels. Have a weakened immune system from an immune disorder or medical treatment. Are pregnant or breastfeeding. What are the signs or symptoms? Symptoms vary depending on the cause of the vaginitis. Common symptoms include: Abnormal vaginal discharge. The discharge is white, gray, or yellow with bacterial vaginosis. The discharge is thick, white, and cheesy with a yeast infection. The discharge is frothy and yellow or greenish with trichomoniasis. A bad vaginal smell. The smell is fishy with bacterial vaginosis. Vaginal itching, pain, or swelling. Pain with sex. Pain or burning when urinating. Sometimes there are no symptoms. How is this diagnosed? This condition is diagnosed based on your symptoms and medical history. A physical exam, including a pelvic exam, will also be done. You may also have other tests, including: Tests to determine the pH level (acidity or alkalinity) of your vagina. A whiff test to assess the odor that results when a sample of your vaginal discharge is mixed with a potassium hydroxide solution. Tests of vaginal fluid. A sample will be examined under a microscope. How is this treated? Treatment varies depending on the type of vaginitis you have. Your treatment may include: Antibiotic creams or pills to treat bacterial vaginosis and trichomoniasis. Antifungal medicines, such as vaginal creams or suppositories, to treat a yeast infection. Medicine to ease discomfort if you have viral vaginitis. Your sexual partner should also be treated. Estrogen delivered in a cream, pill, suppository, or  vaginal ring to treat atrophic vaginitis. If vaginal dryness occurs, lubricants and moisturizing creams may help. You may need to avoid scented soaps, sprays, or douches. Stopping use of a product that is causing allergic vaginitis and then using a vaginal cream to treat the symptoms. Follow these instructions at home: Lifestyle Keep your genital  area clean and dry. Avoid soap, and only rinse the area with water. Do not douche or use tampons until your health care provider says it is okay. Use sanitary pads, if needed. Do not have sex until your health care provider approves. When you can return to sex, practice safe sex and use condoms. Wipe from front to back. This avoids the spread of bacteria from the rectum to the vagina. General instructions Take over-the-counter and prescription medicines only as told by your health care provider. If you were prescribed an antibiotic medicine, take or use it as told by your health care provider. Do not stop taking or using the antibiotic even if you start to feel better. Keep all follow-up visits. This is important. How is this prevented? Use mild, unscented products. Do not use things that can irritate the vagina, such as fabric softeners. Avoid the following products if they are scented: Feminine sprays. Detergents. Tampons. Feminine hygiene products. Soaps or bubble baths. Let air reach your genital area. To do this: Wear cotton underwear to reduce moisture buildup. Avoid wearing underwear while you sleep. Avoid wearing tight pants and underwear or nylons without a cotton panel. Avoid wearing thong underwear. Take off any wet clothing, such as bathing suits, as soon as possible. Practice safe sex and use condoms. Contact a health care provider if: You have abdominal or pelvic pain. You have a fever or chills. You have symptoms that last for more than 2-3 days. Get help right away if: You have a fever and your symptoms suddenly get worse. Summary Vaginitis is a condition in which the vaginal tissue becomes inflamed.This condition is most often caused by a change in the normal balance of bacteria and yeast that live in the vagina. Treatment varies depending on the type of vaginitis you have. Do not douche, use tampons, or have sex until your health care provider approves. When you can  return to sex, practice safe sex and use condoms. This information is not intended to replace advice given to you by your health care provider. Make sure you discuss any questions you have with your health care provider. Document Revised: 08/17/2019 Document Reviewed: 08/17/2019 Elsevier Patient Education  [place].

## 2021-05-06 NOTE — Progress Notes (Signed)
Negative urinalysis, urine culture pending, advise keep scheduled visit.

## 2021-05-06 NOTE — Assessment & Plan Note (Signed)
05/06/2021 long standing history of vulvar cysts seen last by Dr. Enzo Bi Gynecology 2017 was prescribed premarin cream at that time. Also offered gynecology referral and follow up for vulvar cysts, patient's son politely declined at this time. He is aware he may call if he or patient so desires recommended referral.  ?Return if any symptoms worsening or any signs of infection. ?

## 2021-05-06 NOTE — Progress Notes (Signed)
Acute Office Visit  Subjective:    Patient ID: Katherine Olson, female    DOB: December 06, 1926, 88 y.o.   MRN: [id number]  Chief Complaint  Patient presents with   Follow-up    F/u - pt c/o burning/ stinging in genital area.     HPI Patient is in today for vaginal stinging and burning. Denies any abdominal pain or back pain.   History of cyst on vaginal area , saw gynecology in past was given premarin and has been using small amount for years. Is having increased vaginal/ vulvar stinging with urination.    Accompanied with her son who is her caregiver.   Patient wears depends.   2017 seen Dr. Enzo Bi gynecology for biopsy and multiple vulvar cysts. She was started on Premarin 1/2 g intravaginal once or twice a week. She had multiple cysts of the vulvar area at that time that gynecology did not feel needed any further treatment. Son has continued to use premarin as directed by gynecology and not seen gynecology since. 09/05/2015. He and patient denies any worsening cysts and that these are chronic. Denies any vaginal discharge or bleeding.   Patient  denies any fever, body aches,chills, rash, chest pain, shortness of breath, nausea, vomiting, or diarrhea.    Past Medical History:  Diagnosis Date   Depression    Hyperlipidemia    Hypertension     Past Surgical History:  Procedure Laterality Date   ABDOMINAL HYSTERECTOMY     partial   JOINT REPLACEMENT Right 1991   hip    Family History  Problem Relation Age of Onset   Alzheimer's disease Brother    Heart disease Sister    Cancer Sister        kidney   Cancer Mother        liver   Cancer Father        prostate    Social History   Socioeconomic History   Marital status: Widowed    Spouse name: Not on file   Number of children: Not on file   Years of education: Not on file   Highest education level: Not on file  Occupational History    Employer: RETIRED    Comment: housewife  Tobacco Use   Smoking status:  Never   Smokeless tobacco: Never  Substance and Sexual Activity   Alcohol use: No   Drug use: No   Sexual activity: Not Currently    Birth control/protection: Surgical  Other Topics Concern   Not on file  Social History Narrative   Son - Zelina Jimerson      Her son lives with her and cooks her meals   Her son also does her finances   Social Determinants of Radio broadcast assistant Strain: Not on file  Food Insecurity: Not on file  Transportation Needs: Not on file  Physical Activity: Not on file  Stress: Not on file  Social Connections: Not on file  Intimate Partner Violence: Not on file    Outpatient Medications Prior to Visit  Medication Sig Dispense Refill   amLODipine (NORVASC) 5 MG tablet Take 1 tablet (5 mg total) by mouth every morning. 90 tablet 3   citalopram (CELEXA) 20 MG tablet TAKE ONE TABLET BY MOUTH EVERY MORNING 30 tablet 2   Coenzyme Q-10 200 MG CAPS Take by mouth.     losartan (COZAAR) 50 MG tablet Take 1 tablet (50 mg total) by mouth daily. 90 tablet 3   Multiple Vitamins-Minerals (  MULTIVITAMIN WITH MINERALS) tablet Take 1 tablet by mouth daily.     Omega-3 Fatty Acids (FISH OIL PO) Take by mouth.     pravastatin (PRAVACHOL) 20 MG tablet TAKE ONE TABLET BY MOUTH ONCE DAILY 90 tablet 1   PREMARIN vaginal cream PLACE 1 APPLICATORFUL VAGINALLY ONCE DAILY 42.5 g 3   No facility-administered medications prior to visit.    Allergies  Allergen Reactions   Simvastatin Other (See Comments)    Stiffness in joints Onset 07/24/2003.    Review of Systems  Constitutional: Negative.   Respiratory: Negative.    Cardiovascular: Negative.   Gastrointestinal: Negative.   Genitourinary:  Negative for urgency and vaginal pain (vaginal burning with urination.).  Musculoskeletal: Negative.   Skin: Negative.   Psychiatric/Behavioral: Negative.        Objective:    Physical Exam Vitals reviewed.  Constitutional:      General: She is not in acute distress.     Appearance: Normal appearance. She is not ill-appearing, toxic-appearing or diaphoretic.  HENT:     Right Ear: External ear normal.     Left Ear: External ear normal.     Nose: Nose normal.     Mouth/Throat:     Mouth: Mucous membranes are moist.  Cardiovascular:     Rate and Rhythm: Normal rate and regular rhythm.     Pulses: Normal pulses.     Heart sounds: Normal heart sounds.  Pulmonary:     Effort: Pulmonary effort is normal.     Breath sounds: Normal breath sounds.  Abdominal:     Palpations: Abdomen is soft.  Genitourinary:    Exam position: Lithotomy position.     Pubic Area: No rash.      Tanner stage (genital): 5.     Labia:        Right: Rash present. No tenderness or injury.        Left: Rash present. No tenderness or injury.      Urethra: No prolapse.     Vagina: Vaginal discharge (white discharge noted vaginally. Nuswab obtained.) and erythema present. No tenderness, bleeding or prolapsed vaginal walls.     Adnexa: Right adnexa normal and left adnexa normal.       Comments: Multiple soft mobile cysts varying in size from 15m to 1 cm on labia majora/ vulva area. Non tender.  Mild erythema labia majora and minora bilaterally. Musculoskeletal:        General: Normal range of motion.     Right lower leg: No edema.     Left lower leg: No edema.  Skin:    Findings: No rash.  Neurological:     Mental Status: She is oriented to person, place, and time.  Psychiatric:        Mood and Affect: Mood normal.        Behavior: Behavior normal.        Thought Content: Thought content normal.        Judgment: Judgment normal.    BP 116/68 (BP Location: Right Arm, Patient Position: Sitting, Cuff Size: Small)    Pulse 63    Temp 98 F (36.7 C) (Oral)    Ht '5\' 4"'$  (1.626 m)    Wt 150 lb (68 kg)    SpO2 99%    BMI 25.75 kg/m  Wt Readings from Last 3 Encounters:  05/06/21 150 lb (68 kg)  02/19/21 150 lb (68 kg)  12/16/20 153 lb 6.4 oz (69.6 kg)    There  are no preventive  care reminders to display for this patient.  There are no preventive care reminders to display for this patient.   Lab Results  Component Value Date   TSH 2.49 06/11/2017   Lab Results  Component Value Date   WBC 6.1 03/11/2020   HGB 12.9 03/11/2020   HCT 37.7 03/11/2020   MCV 90.4 03/11/2020   PLT 248.0 03/11/2020   Lab Results  Component Value Date   NA 136 12/16/2020   K 4.1 12/16/2020   CO2 27 12/16/2020   GLUCOSE 89 12/16/2020   BUN 11 12/16/2020   CREATININE 1.06 12/16/2020   BILITOT 0.3 12/16/2020   ALKPHOS 57 12/16/2020   AST 16 12/16/2020   ALT 10 12/16/2020   PROT 6.8 12/16/2020   ALBUMIN 4.2 12/16/2020   CALCIUM 9.8 12/16/2020   GFR 45.60 (L) 12/16/2020   Lab Results  Component Value Date   CHOL 163 03/11/2020   Lab Results  Component Value Date   HDL 67.00 03/11/2020   Lab Results  Component Value Date   LDLCALC 84 03/11/2020   Lab Results  Component Value Date   TRIG 62.0 03/11/2020   Lab Results  Component Value Date   CHOLHDL 2 03/11/2020   Lab Results  Component Value Date   HGBA1C 5.7 12/16/2020       Assessment & Plan:   Problem List Items Addressed This Visit       Genitourinary   Vulvar cysts    05/06/2021 long standing history of vulvar cysts seen last by Dr. Enzo Bi Gynecology 2017 was prescribed premarin cream at that time. Also offered gynecology referral and follow up for vulvar cysts, patient's son politely declined at this time. He is aware he may call if he or patient so desires recommended referral.  Return if any symptoms worsening or any signs of infection.      Acute vaginitis - Primary   Relevant Orders   Cervicovaginal ancillary only( [place])     Meds ordered this encounter  Medications   terconazole (TERAZOL 3) 0.8 % vaginal cream    Sig: Place 1 applicator vaginally at bedtime. For 3 days    Dispense:  20 g    Refill:  0   Vaginal discharge has appearance of yeast and some yeast likely  external vaginal area.  Also offered gynecology referral and follow up for vulvar cysts, patient's son politely declined at this time. He is aware he may call if he or patient so desires recommended referral.  Will try terazol cream for possible yeast infection, vaginal swab obtained. Ok to apply small amount topically of terazol as well.   Advised son if using premarin - would advise just using a small dab versus 1/2 gram given long term risks.   Urinalysis negative at this point, urine culture is still pending.  Cervical cytology sent to lab.   Schedule health maintenace with Arnett, Yvetta Coder, FNP PCP as advised.   Red Flags discussed. The patient was given clear instructions to go to ER or return to medical center if any red flags develop, symptoms do not improve, worsen or new problems develop. They verbalized understanding.    Return in about 2 weeks (around 05/20/2021), or if symptoms worsen or fail to improve, for at any time for any worsening symptoms, Go to Emergency room/ urgent care if worse.   Marcille Buffy, FNP

## 2021-05-08 ENCOUNTER — Encounter: Payer: Self-pay | Admitting: Adult Health

## 2021-05-08 LAB — URINE CULTURE

## 2021-05-08 LAB — CERVICOVAGINAL ANCILLARY ONLY
Bacterial Vaginitis (gardnerella): NEGATIVE
Candida Glabrata: NEGATIVE
Candida Vaginitis: NEGATIVE
Comment: NEGATIVE
Comment: NEGATIVE
Comment: NEGATIVE
Comment: NEGATIVE
Trichomonas: NEGATIVE

## 2021-05-08 NOTE — Progress Notes (Signed)
No significant bacteria found. If any symptoms persist we need to collect another clean catch urine.  ?

## 2021-05-08 NOTE — Progress Notes (Signed)
Negative cervical swab.  ?Still can place referral to gynecology if symptoms persist given vulvar cysts present and her history.

## 2021-05-12 NOTE — Telephone Encounter (Signed)
Patient called checking on his MyChart message. ?

## 2021-05-16 ENCOUNTER — Telehealth: Payer: Self-pay | Admitting: Family

## 2021-05-16 ENCOUNTER — Encounter: Payer: Self-pay | Admitting: Family

## 2021-05-16 NOTE — Telephone Encounter (Signed)
Patient's son would like to know if Joycelyn Schmid will want labs for the April appointment. Please call and let him know. ?

## 2021-05-16 NOTE — Telephone Encounter (Signed)
Patient's son also want to add that he does not want his mother to have labs, since she just had labs in October. ?

## 2021-05-19 ENCOUNTER — Telehealth: Payer: Self-pay

## 2021-05-19 NOTE — Telephone Encounter (Signed)
Called pt to inform that AWV has been cancelled due to [place] being floated to another office and she would be calling her at a later date to R/S. Unable to reach nor LVM. ?

## 2021-05-21 NOTE — Telephone Encounter (Signed)
Spoke to patients son and he stated that he just does not want to keep bringing mom for appointments because she is too old and it's just too much. He stated Mom is doing just fine.He just wants to continue with virtual visits and getting her medication until October ?

## 2021-06-12 ENCOUNTER — Other Ambulatory Visit: Payer: Self-pay | Admitting: Family

## 2021-06-12 DIAGNOSIS — I10 Essential (primary) hypertension: Secondary | ICD-10-CM

## 2021-06-13 ENCOUNTER — Other Ambulatory Visit: Payer: Self-pay

## 2021-06-13 ENCOUNTER — Telehealth (INDEPENDENT_AMBULATORY_CARE_PROVIDER_SITE_OTHER): Payer: Medicare HMO | Admitting: Family

## 2021-06-13 DIAGNOSIS — E785 Hyperlipidemia, unspecified: Secondary | ICD-10-CM | POA: Diagnosis not present

## 2021-06-13 DIAGNOSIS — I10 Essential (primary) hypertension: Secondary | ICD-10-CM

## 2021-06-13 NOTE — Progress Notes (Signed)
Verbal consent for services obtained from patient prior to services given to TELEPHONE visit:  ? ?Location of call: ? provider at work ?patient at home ? ?Names of all persons present for services: Mable Paris, NP and patient ?Chief complaint:  ?Accompanied by son, [name] ?She feels well today ?No new complaints  ? ?HLD- compliant with pravastatin '20mg'$  ?HTN- compliant with amlodipine '5mg'$ , losartan '50mg'$ . No cp, sob, leg swelling. ?Anxiety and depression- compliant with celexa '20mg'$ . Sleeping well. Appetite good.  ? ? ? ? ?A/P/next steps: ? ?Problem List Items Addressed This Visit   ? ?  ? Cardiovascular and Mediastinum  ? Essential hypertension  ?  Chronic, stable.  Patient very politely declined coming in the office for in person evaluation and labs.  They plan to do this in the next 4 to 6 months.  Continue amlodipine '5mg'$ , losartan '50mg'$  ? ?  ?  ?  ? Other  ? Hyperlipidemia  ?  Chronic, stable. Continue pravastatin '20mg'$  ? ?  ?  ? ? ? ?I spent 15 min  discussing plan of care over the phone.  ? ? ? ? ? ? ? ? ?

## 2021-06-13 NOTE — Assessment & Plan Note (Addendum)
Chronic, stable.  Patient very politely declined coming in the office for in person evaluation and labs.  They plan to do this in the next 4 to 6 months.  Continue amlodipine '5mg'$ , losartan '50mg'$  ?

## 2021-06-13 NOTE — Assessment & Plan Note (Signed)
Chronic, stable. Continue pravastatin '20mg'$  ?

## 2021-06-13 NOTE — Patient Instructions (Signed)
Please call to schedule follow-up in the next 3 to 6 months.  Please ensure that it is in person ?

## 2021-06-13 NOTE — Progress Notes (Signed)
Spoke to son during pre visit. Still ademant about his mom not coming back and forth to office, says mom is doing great and he takes care of her but it is just too much to come back and forth all the time..but needs refills that will last them until October when she comes back for her physical ?

## 2021-06-16 ENCOUNTER — Telehealth: Payer: Medicare HMO | Admitting: Family

## 2021-06-16 ENCOUNTER — Telehealth: Payer: Self-pay | Admitting: Family

## 2021-06-16 DIAGNOSIS — E785 Hyperlipidemia, unspecified: Secondary | ICD-10-CM

## 2021-06-16 DIAGNOSIS — I10 Essential (primary) hypertension: Secondary | ICD-10-CM

## 2021-06-16 DIAGNOSIS — F419 Anxiety disorder, unspecified: Secondary | ICD-10-CM

## 2021-06-16 MED ORDER — PRAVASTATIN SODIUM 20 MG PO TABS: 20.0000 mg | ORAL_TABLET | Freq: Every day | ORAL | 2 refills | Status: DC

## 2021-06-16 MED ORDER — LOSARTAN POTASSIUM 50 MG PO TABS: 50.0000 mg | ORAL_TABLET | Freq: Every day | ORAL | 2 refills | Status: DC

## 2021-06-16 MED ORDER — CITALOPRAM HYDROBROMIDE 20 MG PO TABS: 20.0000 mg | ORAL_TABLET | Freq: Every morning | ORAL | 2 refills | Status: DC

## 2021-06-16 NOTE — Telephone Encounter (Signed)
Refills sent

## 2021-06-16 NOTE — Telephone Encounter (Signed)
The patient's son stated that he requested  a 90 day supply of all of his mothers medication with 3 refills sent to [facility].  ?

## 2021-06-16 NOTE — Telephone Encounter (Signed)
Pt son stated that did not get a refill on lorsartan sent to [facility] . Pt son did not get what the provider said he was going to get which is the 90 day supply plus a 90 day refill ?

## 2021-06-16 NOTE — Telephone Encounter (Signed)
Script was filled for 90 pharmacy only had 60 patient DPR aware, ?

## 2021-07-01 ENCOUNTER — Emergency Department: Payer: Medicare HMO

## 2021-07-01 ENCOUNTER — Other Ambulatory Visit: Payer: Self-pay

## 2021-07-01 ENCOUNTER — Emergency Department
Admission: EM | Admit: 2021-07-01 | Discharge: 2021-07-01 | Disposition: A | Discharge status: Home / Self Care | Payer: Medicare HMO | Attending: Emergency Medicine | Admitting: Emergency Medicine

## 2021-07-01 DIAGNOSIS — I10 Essential (primary) hypertension: Secondary | ICD-10-CM | POA: Insufficient documentation

## 2021-07-01 DIAGNOSIS — Z20822 Contact with and (suspected) exposure to covid-19: Secondary | ICD-10-CM | POA: Diagnosis not present

## 2021-07-01 DIAGNOSIS — D72829 Elevated white blood cell count, unspecified: Secondary | ICD-10-CM | POA: Diagnosis not present

## 2021-07-01 DIAGNOSIS — R531 Weakness: Secondary | ICD-10-CM | POA: Diagnosis not present

## 2021-07-01 DIAGNOSIS — R42 Dizziness and giddiness: Secondary | ICD-10-CM | POA: Diagnosis not present

## 2021-07-01 DIAGNOSIS — R410 Disorientation, unspecified: Secondary | ICD-10-CM | POA: Insufficient documentation

## 2021-07-01 DIAGNOSIS — R4182 Altered mental status, unspecified: Secondary | ICD-10-CM | POA: Diagnosis not present

## 2021-07-01 DIAGNOSIS — R569 Unspecified convulsions: Secondary | ICD-10-CM | POA: Diagnosis not present

## 2021-07-01 LAB — CBC WITH DIFFERENTIAL/PLATELET
Abs Immature Granulocytes: 0.08 10*3/uL — ABNORMAL HIGH (ref 0.00–0.07)
Basophils Absolute: 0.1 10*3/uL (ref 0.0–0.1)
Basophils Relative: 0 %
Eosinophils Absolute: 0 10*3/uL (ref 0.0–0.5)
Eosinophils Relative: 0 %
HCT: 36.4 % (ref 36.0–46.0)
Hemoglobin: 12.3 g/dL (ref 12.0–15.0)
Immature Granulocytes: 1 %
Lymphocytes Relative: 3 %
Lymphs Abs: 0.5 10*3/uL — ABNORMAL LOW (ref 0.7–4.0)
MCH: 30.3 pg (ref 26.0–34.0)
MCHC: 33.8 g/dL (ref 30.0–36.0)
MCV: 89.7 fL (ref 80.0–100.0)
Monocytes Absolute: 1.1 10*3/uL — ABNORMAL HIGH (ref 0.1–1.0)
Monocytes Relative: 7 %
Neutro Abs: 14.8 10*3/uL — ABNORMAL HIGH (ref 1.7–7.7)
Neutrophils Relative %: 89 %
Platelets: 208 10*3/uL (ref 150–400)
RBC: 4.06 MIL/uL (ref 3.87–5.11)
RDW: 12.6 % (ref 11.5–15.5)
WBC: 16.5 10*3/uL — ABNORMAL HIGH (ref 4.0–10.5)
nRBC: 0 % (ref 0.0–0.2)

## 2021-07-01 LAB — TROPONIN I (HIGH SENSITIVITY)
Troponin I (High Sensitivity): 15 ng/L (ref <18)
Troponin I (High Sensitivity): 14 ng/L (ref <18)

## 2021-07-01 LAB — URINALYSIS, ROUTINE W REFLEX MICROSCOPIC
Bacteria, UA: NONE SEEN
Bilirubin Urine: NEGATIVE
Glucose, UA: NEGATIVE mg/dL
Ketones, ur: NEGATIVE mg/dL
Leukocytes,Ua: NEGATIVE
Nitrite: NEGATIVE
Protein, ur: NEGATIVE mg/dL
Specific Gravity, Urine: 1.011 (ref 1.005–1.030)
Squamous Epithelial / HPF: NONE SEEN (ref 0–5)
pH: 6 (ref 5.0–8.0)

## 2021-07-01 LAB — APTT: aPTT: 31 seconds (ref 24–36)

## 2021-07-01 LAB — COMPREHENSIVE METABOLIC PANEL
ALT: 26 U/L (ref 0–44)
AST: 33 U/L (ref 15–41)
Albumin: 3.9 g/dL (ref 3.5–5.0)
Alkaline Phosphatase: 55 U/L (ref 38–126)
Anion gap: 9 (ref 5–15)
BUN: 17 mg/dL (ref 8–23)
CO2: 23 mmol/L (ref 22–32)
Calcium: 9.8 mg/dL (ref 8.9–10.3)
Chloride: 99 mmol/L (ref 98–111)
Creatinine, Ser: 1.08 mg/dL — ABNORMAL HIGH (ref 0.44–1.00)
GFR, Estimated: 48 mL/min — ABNORMAL LOW (ref >60)
Glucose, Bld: 98 mg/dL (ref 70–99)
Potassium: 3.3 mmol/L — ABNORMAL LOW (ref 3.5–5.1)
Sodium: 131 mmol/L — ABNORMAL LOW (ref 135–145)
Total Bilirubin: 0.7 mg/dL (ref 0.3–1.2)
Total Protein: 6.9 g/dL (ref 6.5–8.1)

## 2021-07-01 LAB — RESP PANEL BY RT-PCR (FLU A&B, COVID) ARPGX2
Influenza A by PCR: NEGATIVE
Influenza B by PCR: NEGATIVE
SARS Coronavirus 2 by RT PCR: NEGATIVE

## 2021-07-01 LAB — PROTIME-INR
INR: 1 (ref 0.8–1.2)
Prothrombin Time: 13.5 seconds (ref 11.4–15.2)

## 2021-07-01 NOTE — ED Notes (Addendum)
Pt ambulated to the restroom without assistance for a urine sample- Son at the bedside.  ?

## 2021-07-01 NOTE — ED Provider Triage Note (Signed)
Emergency Medicine Provider Triage Evaluation Note ? ?Katherine Olson , a 88 y.o. female  was evaluated in triage.  Pt complains of altered mental status, weakness, confusion.  According to the son who brought the patient, the patient was acting "strange" to him earlier today.  He was having to leave the house, and asked his mother to walk.  He states that she had a stumbling gait which was atypical for her.  Patient had to leave and called a family member to come watch her.  Patient called back 30 minutes after leaving to see how she was she answered the phone but dropped it.  Reportedly family member went to check on her and she was very confused, not her normal self.  Per the son when he arrived home she was very confused, agitated, not herself.  They arrived she is returning back to her baseline.. ? ?Review of Systems  ?Positive: Confusion, weakness ?Negative: Fevers, chills, vomiting, diarrhea ? ?Physical Exam  ?BP 127/69 (BP Location: Left Arm)   Pulse 78   Temp 98.5 ?F (36.9 ?C) (Oral)   Resp 20   Wt 69.4 kg   SpO2 95%   BMI 26.26 kg/m?  ?Gen:   Awake, no distress   ?Resp:  Normal effort  ?MSK:   Moves extremities without difficulty  ?Other:   ? ?Medical Decision Making  ?Medically screening exam initiated at 7:10 PM.  Appropriate orders placed.  JANELLI WELLING was informed that the remainder of the evaluation will be completed by another provider, this initial triage assessment does not replace that evaluation, and the importance of remaining in the ED until their evaluation is complete. ? ?Patient arrives with side for altered mental status.  Patient does have a history of dementia but typically this is just "remembering things."  Patient became very confused, agitated earlier today.  Reportedly this change lasted several hours the patient does appear to be coming back towards her baseline.  At this time will order CT, x-ray, EKG, labs and urine. ?  ?Darletta Moll, PA-C ?07/01/21 1916 ? ?

## 2021-07-01 NOTE — Discharge Instructions (Addendum)
Your work-up here showed that you had an elevated white blood cell count but the rest of your blood work, chest x-ray and urine sample all were reassuring.  We did not find any UTI or other source of infection ? ?If your symptoms are worsening, please return to the emergency department. ?

## 2021-07-01 NOTE — ED Triage Notes (Signed)
Pt BIB EMS for AMS. Per family member, pt was having some confusion and weakness after taking a nap this evening. Pt does have dementia.  ?

## 2021-07-01 NOTE — ED Notes (Signed)
First nurse note-pt brought in via ems from home with ams.  Hx dementia.  Iv 20g rac. Pt alert,family with pt sitting in wheelchair.   ?

## 2021-07-01 NOTE — ED Provider Notes (Signed)
? ?[facility] ?Provider Note ? ? ? Event Date/Time  ? First MD Initiated Contact with Patient 07/01/21 2039   ?  (approximate) ? ? ?History  ? ?Altered Mental Status ? ? ?HPI ? ?BRITNEY Olson is a 88 y.o. female  with pmh HTN, HLD, depression who presents with confusion.  Patient is accompanied by her son who notes that when she woke up from a nap today she seemed somewhat confused having difficulty getting up and sounded hoarse.  She has since returned to baseline.  She denies any pain including abdominal pain chest pain nausea vomiting fevers chills denies dysuria.  He says that she is currently at her baseline which is confused.  Typically she walks on her own and has been able to do this in the ED. ?  ? ?Past Medical History:  ?Diagnosis Date  ? Depression   ? Hyperlipidemia   ? Hypertension   ? ? ?Patient Active Problem List  ? Diagnosis Date Noted  ? Acute vaginitis 05/06/2021  ? Acute cystitis with hematuria 02/19/2021  ? Decreased GFR 02/19/2021  ? Corns and callosities 04/18/2020  ? Right renal mass 02/05/2020  ? Pain due to onychomycosis of toenails of both feet 08/14/2019  ? BPPV (benign paroxysmal positional vertigo) 12/13/2017  ? Anxiety and depression 12/13/2017  ? Osteopenia 03/17/2017  ? Nausea & vomiting 08/02/2016  ? Atrophic vaginitis 08/08/2015  ? Vulvar cysts 08/06/2015  ? Routine physical examination 08/19/2014  ? Cataract 04/06/2014  ? Alzheimer's disease ([place]) 04/06/2014  ? Essential hypertension 04/06/2014  ? Urge incontinence 11/02/2013  ? Memory loss 11/02/2013  ? Hyperlipidemia   ? ? ? ?Physical Exam  ?Triage Vital Signs: ?ED Triage Vitals  ?Enc Vitals Group  ?   BP 07/01/21 1907 127/69  ?   Pulse Rate 07/01/21 1907 78  ?   Resp 07/01/21 1907 20  ?   Temp 07/01/21 1907 98.5 ?F (36.9 ?C)  ?   Temp Source 07/01/21 1907 Oral  ?   SpO2 07/01/21 1907 95 %  ?   Weight 07/01/21 1908 153 lb (69.4 kg)  ?   Height --   ?   Head Circumference --   ?   Peak Flow --   ?   Pain  Score 07/01/21 1908 0  ?   Pain Loc --   ?   Pain Edu? --   ?   Excl. in [place]? --   ? ? ?Most recent vital signs: ?Vitals:  ? 07/01/21 2130 07/01/21 2230  ?BP: 137/74 128/77  ?Pulse: 77 78  ?Resp: 18 18  ?Temp:    ?SpO2: 98% 98%  ? ? ? ?General: Awake, no distress.  ?CV:  Good peripheral perfusion.  ?Resp:  Normal effort.  ?Abd:  No distention.  Nontender ?Neuro:             Awake, Alert, Oriented to person and place but not to time ?Other:  Aox3, nml speech  ?PERRL, EOMI, face symmetric, nml tongue movement  ?5/5 strength in the BL upper and lower extremities  ?Sensation grossly intact in the BL upper and lower extremities  ?Finger-nose-finger intact BL ? ? ? ?ED Results / Procedures / Treatments  ?Labs ?(all labs ordered are listed, but only abnormal results are displayed) ?Labs Reviewed  ?COMPREHENSIVE METABOLIC PANEL - Abnormal; Notable for the following components:  ?    Result Value  ? Sodium 131 (*)   ? Potassium 3.3 (*)   ?  Creatinine, Ser 1.08 (*)   ? GFR, Estimated 48 (*)   ? All other components within normal limits  ?CBC WITH DIFFERENTIAL/PLATELET - Abnormal; Notable for the following components:  ? WBC 16.5 (*)   ? Neutro Abs 14.8 (*)   ? Lymphs Abs 0.5 (*)   ? Monocytes Absolute 1.1 (*)   ? Abs Immature Granulocytes 0.08 (*)   ? All other components within normal limits  ?URINALYSIS, ROUTINE W REFLEX MICROSCOPIC - Abnormal; Notable for the following components:  ? Color, Urine YELLOW (*)   ? APPearance CLEAR (*)   ? Hgb urine dipstick SMALL (*)   ? All other components within normal limits  ?RESP PANEL BY RT-PCR (FLU A&B, COVID) ARPGX2  ?PROTIME-INR  ?APTT  ?TROPONIN I (HIGH SENSITIVITY)  ?TROPONIN I (HIGH SENSITIVITY)  ? ? ? ?EKG ? ?EKG interpreted by myself shows some nonspecific ST depression in V6 ? ? ?RADIOLOGY ?I reviewed the CXR which does not show any acute cardiopulmonary process; agree with radiology report  ? ? ? ?PROCEDURES: ? ?Critical Care performed: No ? ?Procedures ? ? ? ?MEDICATIONS  ORDERED IN ED: ?Medications - No data to display ? ? ?IMPRESSION / MDM / ASSESSMENT AND PLAN / ED COURSE  ?I reviewed the triage vital signs and the nursing notes. ?             ?               ? ?Differential diagnosis includes, but is not limited to, electrolyte abnormality, progressive dementia, infection from UTI, pneumonia ? ?Patient is a 88 year old female with underlying dementia who presents with confusion.  Woke up from a nap today and was more confused than normal having difficulty getting out of bed.  Her son also notes that she said she felt cold.  Vital signs within normal limits including she is afebrile with normal blood pressure.  She appears well on exam is alert and oriented to person and place but not time her son notes that this is her baseline.  She has a nonfocal neurologic exam.  Abdomen is soft and nontender no signs of skin or soft tissue infection.  Chest x-ray does not show any infiltrate and UA is negative.  She does have a leukocytosis of 16 but no other signs of endorgan damage no obvious source of infection.  Patient is able to ambulate.  BMP otherwise reassuring troponin x2 are negative EKG without ischemic change.  CT head without acute abnormality.  Patient does have a leukocytosis but have not identified a source of infection and with her normal vital signs being back to baseline able to ambulate I think that she is appropriate for discharge.  Patient also has safe discharge plan with son who will keep an eye on her and return to the ED if symptoms are worsening or she develops any new symptoms. ?  ? ? ?FINAL CLINICAL IMPRESSION(S) / ED DIAGNOSES  ? ?Final diagnoses:  ?Confusion  ? ? ? ?Rx / DC Orders  ? ?ED Discharge Orders   ? ? None  ? ?  ? ? ? ?Note:  This document was prepared using Dragon voice recognition software and may include unintentional dictation errors. ?  ?Rada Hay, MD ?07/01/21 2257 ? ?

## 2021-07-01 NOTE — ED Notes (Addendum)
E-signature pad unavailable - Pt & Son verbalized understanding of D/C information - no additional concerns at this time. ? ? ?20G IV placed by EMS removed from the patients RAC. ?

## 2021-07-03 ENCOUNTER — Encounter: Payer: Self-pay | Admitting: Podiatry

## 2021-07-03 ENCOUNTER — Ambulatory Visit: Payer: Medicare HMO | Admitting: Podiatry

## 2021-07-03 DIAGNOSIS — B351 Tinea unguium: Secondary | ICD-10-CM

## 2021-07-03 DIAGNOSIS — M79675 Pain in left toe(s): Secondary | ICD-10-CM | POA: Diagnosis not present

## 2021-07-03 DIAGNOSIS — G309 Alzheimer's disease, unspecified: Secondary | ICD-10-CM | POA: Diagnosis not present

## 2021-07-03 DIAGNOSIS — M79674 Pain in right toe(s): Secondary | ICD-10-CM | POA: Diagnosis not present

## 2021-07-03 DIAGNOSIS — F0280 Dementia in other diseases classified elsewhere without behavioral disturbance: Secondary | ICD-10-CM

## 2021-07-03 DIAGNOSIS — L84 Corns and callosities: Secondary | ICD-10-CM

## 2021-07-03 NOTE — Progress Notes (Signed)
This patient returns to the office for evaluation and treatment of long thick painful nails .  This patient is unable to trim her own nails since the patient cannot reach her feet.  Patient says the nails are painful walking and wearing her shoes.  She returns for preventive foot care services.   General Appearance  Alert, conversant and in no acute stress.  Vascular  Dorsalis pedis are weakly palpable  Bilaterally.  Posterior tibial pulses are absent  B/L.  Capillary return is within normal limits  bilaterally. Cold feet  Bilaterally. Absent digital hair.  Neurologic  Senn-Weinstein monofilament wire test within normal limits  bilaterally. Muscle power within normal limits bilaterally.  Nails Thick disfigured discolored nails with subungual debris  from hallux to fifth toes bilaterally. No evidence of bacterial infection or drainage bilaterally.  Orthopedic  No limitations of motion  feet .  No crepitus or effusions noted.  No bony pathology or digital deformities noted.  Skin  normotropic skin with no porokeratosis noted bilaterally.  No signs of infections or ulcers noted.   Listers corn fifth toe right foot.  Onychomycosis  Pain in toes right foot  Pain in toes left foot  Debridement  of nails  1-5  B/L with a nail nipper.  Nails were then filed using a dremel tool with no incidents.   RTC  4  months   [place] Whedbee DPM  

## 2021-07-19 ENCOUNTER — Other Ambulatory Visit: Payer: Self-pay | Admitting: Family

## 2021-07-19 DIAGNOSIS — I10 Essential (primary) hypertension: Secondary | ICD-10-CM

## 2021-07-21 ENCOUNTER — Other Ambulatory Visit: Payer: Self-pay

## 2021-07-21 ENCOUNTER — Telehealth: Payer: Self-pay

## 2021-07-21 DIAGNOSIS — I10 Essential (primary) hypertension: Secondary | ICD-10-CM

## 2021-07-21 MED ORDER — AMLODIPINE BESYLATE 5 MG PO TABS: 5.0000 mg | ORAL_TABLET | Freq: Every morning | ORAL | 1 refills | Status: DC

## 2021-07-21 NOTE — Telephone Encounter (Signed)
I have sent to [facility] on [address].

## 2021-07-21 NOTE — Telephone Encounter (Signed)
Patient's son, Ardyce Heyer, called to state patient needs refill of amlodipine.  [name] states pharmacy states patient has no refills left and has sent information to our office.  Venora Maples states Mable Paris, NP, is aware that patient's next appointment with her will be in October, 2023, and is ok with refilling the medication.  [name] states patient's preferred pharmacy is [facility] in [place].

## 2021-10-12 ENCOUNTER — Other Ambulatory Visit: Payer: Self-pay | Admitting: Family

## 2021-10-12 DIAGNOSIS — F419 Anxiety disorder, unspecified: Secondary | ICD-10-CM

## 2021-10-26 ENCOUNTER — Other Ambulatory Visit: Payer: Self-pay | Admitting: Family

## 2021-10-26 DIAGNOSIS — I10 Essential (primary) hypertension: Secondary | ICD-10-CM

## 2021-11-17 ENCOUNTER — Ambulatory Visit (INDEPENDENT_AMBULATORY_CARE_PROVIDER_SITE_OTHER): Payer: Medicare HMO | Admitting: Family

## 2021-11-17 ENCOUNTER — Other Ambulatory Visit: Payer: Self-pay

## 2021-11-17 ENCOUNTER — Encounter: Payer: Self-pay | Admitting: Family

## 2021-11-17 VITALS — BP 130/80 | HR 70 | Temp 97.9°F | Ht 66.0 in | Wt 154.0 lb

## 2021-11-17 DIAGNOSIS — Z23 Encounter for immunization: Secondary | ICD-10-CM

## 2021-11-17 DIAGNOSIS — E785 Hyperlipidemia, unspecified: Secondary | ICD-10-CM

## 2021-11-17 DIAGNOSIS — Z0000 Encounter for general adult medical examination without abnormal findings: Secondary | ICD-10-CM

## 2021-11-17 DIAGNOSIS — F419 Anxiety disorder, unspecified: Secondary | ICD-10-CM

## 2021-11-17 DIAGNOSIS — I10 Essential (primary) hypertension: Secondary | ICD-10-CM

## 2021-11-17 LAB — LIPID PANEL
Cholesterol: 177 mg/dL (ref 0–200)
HDL: 76.8 mg/dL (ref >39.00)
LDL Cholesterol: 89 mg/dL (ref 0–99)
NonHDL: 100.64
Total CHOL/HDL Ratio: 2
Triglycerides: 60 mg/dL (ref 0.0–149.0)
VLDL: 12 mg/dL (ref 0.0–40.0)

## 2021-11-17 LAB — COMPREHENSIVE METABOLIC PANEL
ALT: 10 U/L (ref 0–35)
AST: 17 U/L (ref 0–37)
Albumin: 4.3 g/dL (ref 3.5–5.2)
Alkaline Phosphatase: 56 U/L (ref 39–117)
BUN: 19 mg/dL (ref 6–23)
CO2: 24 mEq/L (ref 19–32)
Calcium: 10.2 mg/dL (ref 8.4–10.5)
Chloride: 102 mEq/L (ref 96–112)
Creatinine, Ser: 1.01 mg/dL (ref 0.40–1.20)
GFR: 48.01 mL/min — ABNORMAL LOW (ref >60.00)
Glucose, Bld: 87 mg/dL (ref 70–99)
Potassium: 4 mEq/L (ref 3.5–5.1)
Sodium: 137 mEq/L (ref 135–145)
Total Bilirubin: 0.5 mg/dL (ref 0.2–1.2)
Total Protein: 7.3 g/dL (ref 6.0–8.3)

## 2021-11-17 LAB — CBC WITH DIFFERENTIAL/PLATELET
Basophils Absolute: 0.1 10*3/uL (ref 0.0–0.1)
Basophils Relative: 0.8 % (ref 0.0–3.0)
Eosinophils Absolute: 0 10*3/uL (ref 0.0–0.7)
Eosinophils Relative: 0.6 % (ref 0.0–5.0)
HCT: 39.5 % (ref 36.0–46.0)
Hemoglobin: 13.3 g/dL (ref 12.0–15.0)
Lymphocytes Relative: 25.4 % (ref 12.0–46.0)
Lymphs Abs: 1.7 10*3/uL (ref 0.7–4.0)
MCHC: 33.6 g/dL (ref 30.0–36.0)
MCV: 90.4 fl (ref 78.0–100.0)
Monocytes Absolute: 0.5 10*3/uL (ref 0.1–1.0)
Monocytes Relative: 7.1 % (ref 3.0–12.0)
Neutro Abs: 4.4 10*3/uL (ref 1.4–7.7)
Neutrophils Relative %: 66.1 % (ref 43.0–77.0)
Platelets: 221 10*3/uL (ref 150.0–400.0)
RBC: 4.37 Mil/uL (ref 3.87–5.11)
RDW: 13.6 % (ref 11.5–15.5)
WBC: 6.7 10*3/uL (ref 4.0–10.5)

## 2021-11-17 LAB — TSH: TSH: 3.17 u[IU]/mL (ref 0.35–5.50)

## 2021-11-17 LAB — VITAMIN D 25 HYDROXY (VIT D DEFICIENCY, FRACTURES): VITD: 50.7 ng/mL (ref 30.00–100.00)

## 2021-11-17 LAB — HEMOGLOBIN A1C: Hgb A1c MFr Bld: 5.8 % (ref 4.6–6.5)

## 2021-11-17 MED ORDER — CITALOPRAM HYDROBROMIDE 20 MG PO TABS: ORAL_TABLET | ORAL | 3 refills | Status: DC

## 2021-11-17 MED ORDER — AMLODIPINE BESYLATE 5 MG PO TABS: 5.0000 mg | ORAL_TABLET | Freq: Every morning | ORAL | 3 refills | Status: DC

## 2021-11-17 MED ORDER — LOSARTAN POTASSIUM 50 MG PO TABS: 50.0000 mg | ORAL_TABLET | Freq: Every day | ORAL | 0 refills | Status: DC

## 2021-11-17 NOTE — Progress Notes (Signed)
Subjective:    Patient ID: Katherine Olson, female    DOB: September 11, 1926, 88 y.o.   MRN: [id number]  CC: Katherine Olson is a 88 y.o. female who presents today for physical exam.    HPI: Accompanied by son today.  Feels well today, no complaints.  She is not very physically active and sleeps late during the day and stays in bed most of the day per son.  He often offers for her to be walking outside and she will do only occasionally.  Patient was happy and states "I am almost 88 years old, I can do what I want'  Bowels are normal.  Appetite is good.   Hypertension-compliant with amlodipine 5 mg, losartan 50 mg.  No chest pain, dizziness Hyperlipidemia-compliant with pravastatin 20 mg  Colorectal Cancer Screening: She is no longer screening for colon cancer Breast Cancer Screening: She is no longer screening for breast cancer Cervical Cancer Screening: History of hysterectomy.  No longer screening for cervical cancer Bone Health screening/DEXA for 65+: due  Lung Cancer Screening: Doesn't have 20 year pack year history and age > 65 years yo 74 years       Tetanus - UTD         Labs: Screening labs today. Exercise: No regular exercise.   Alcohol use:  never Smoking/tobacco use: Nonsmoker.     HISTORY:  Past Medical History:  Diagnosis Date   Depression    Hyperlipidemia    Hypertension     Past Surgical History:  Procedure Laterality Date   ABDOMINAL HYSTERECTOMY     partial   JOINT REPLACEMENT Right 1991   hip   Family History  Problem Relation Age of Onset   Alzheimer's disease Brother    Heart disease Sister    Cancer Sister        kidney   Cancer Mother        liver   Cancer Father        prostate      ALLERGIES: Simvastatin  Current Outpatient Medications on File Prior to Visit  Medication Sig Dispense Refill   Coenzyme Q-10 200 MG CAPS Take by mouth.     Multiple Vitamins-Minerals (MULTIVITAMIN WITH MINERALS) tablet Take 1 tablet by mouth daily.     Omega-3  Fatty Acids (FISH OIL PO) Take by mouth.     pravastatin (PRAVACHOL) 20 MG tablet Take 1 tablet (20 mg total) by mouth daily. 90 tablet 2   PREMARIN vaginal cream PLACE 1 APPLICATORFUL VAGINALLY ONCE DAILY (Patient not taking: Reported on 11/17/2021) 42.5 g 3   terconazole (TERAZOL 3) 0.8 % vaginal cream Place 1 applicator vaginally at bedtime. For 3 days (Patient not taking: Reported on 11/17/2021) 20 g 0   No current facility-administered medications on file prior to visit.    Social History   Tobacco Use   Smoking status: Never   Smokeless tobacco: Never  Substance Use Topics   Alcohol use: No   Drug use: No    Review of Systems  Constitutional:  Negative for chills, fever and unexpected weight change.  HENT:  Negative for congestion.   Respiratory:  Negative for cough.   Cardiovascular:  Negative for chest pain, palpitations and leg swelling.  Gastrointestinal:  Negative for abdominal pain, nausea and vomiting.  Genitourinary:  Negative for pelvic pain.  Musculoskeletal:  Negative for arthralgias and myalgias.  Skin:  Negative for rash.  Neurological:  Negative for headaches.  Hematological:  Negative for adenopathy.  Psychiatric/Behavioral:  Negative for confusion.       Objective:    BP 130/80 (BP Location: Left Arm, Patient Position: Sitting, Cuff Size: Normal)   Pulse 70   Temp 97.9 F (36.6 C) (Oral)   Ht '5\' 6"'$  (1.676 m)   Wt 154 lb (69.9 kg)   SpO2 97%   BMI 24.86 kg/m   BP Readings from Last 3 Encounters:  11/17/21 130/80  07/01/21 128/77  05/06/21 116/68   Wt Readings from Last 3 Encounters:  11/17/21 154 lb (69.9 kg)  07/01/21 153 lb (69.4 kg)  06/13/21 153 lb (69.4 kg)    Physical Exam Vitals reviewed.  Constitutional:      Appearance: Normal appearance. She is well-developed.  Eyes:     Conjunctiva/sclera: Conjunctivae normal.  Neck:     Thyroid: No thyroid mass or thyromegaly.  Cardiovascular:     Rate and Rhythm: Normal rate and regular  rhythm.     Pulses: Normal pulses.     Heart sounds: Normal heart sounds.  Pulmonary:     Effort: Pulmonary effort is normal.     Breath sounds: Normal breath sounds. No wheezing, rhonchi or rales.  Chest:  Breasts:    Breasts are symmetrical.     Right: No inverted nipple, mass, nipple discharge, skin change or tenderness.     Left: No inverted nipple, mass, nipple discharge, skin change or tenderness.  Abdominal:     General: Bowel sounds are normal. There is no distension.     Palpations: Abdomen is soft. Abdomen is not rigid. There is no fluid wave or mass.     Tenderness: There is no abdominal tenderness. There is no guarding or rebound.  Lymphadenopathy:     Head:     Right side of head: No submental, submandibular, tonsillar, preauricular, posterior auricular or occipital adenopathy.     Left side of head: No submental, submandibular, tonsillar, preauricular, posterior auricular or occipital adenopathy.     Cervical: No cervical adenopathy.     Right cervical: No superficial, deep or posterior cervical adenopathy.    Left cervical: No superficial, deep or posterior cervical adenopathy.  Skin:    General: Skin is warm and dry.  Neurological:     Mental Status: She is alert.  Psychiatric:        Speech: Speech normal.        Behavior: Behavior normal.        Thought Content: Thought content normal.        Assessment & Plan:   Problem List Items Addressed This Visit       Cardiovascular and Mediastinum   Essential hypertension    Chronic, stable.  Continue amlodipine 5 mg, losartan 50 mg        Other   Hyperlipidemia    Anticipate controlled. Continue pravastatin '20mg'$       Routine physical examination - Primary    Clinical breast preformed today.  Patient no longer screening for breast cancer.  She politely declines bone density .  Encouraged walking with son.      Relevant Orders   CBC with Differential/Platelet   Comprehensive metabolic panel    Hemoglobin A1c   Lipid panel   TSH   VITAMIN D 25 Hydroxy (Vit-D Deficiency, Fractures)     I am having Katherine Olson maintain her multivitamin with minerals, Omega-3 Fatty Acids (FISH OIL PO), Coenzyme Q-10, Premarin, terconazole, and pravastatin.   No orders of the defined types were placed in  this encounter.   Return precautions given.   Risks, benefits, and alternatives of the medications and treatment plan prescribed today were discussed, and patient expressed understanding.   Education regarding symptom management and diagnosis given to patient on AVS.   Continue to follow with Burnard Hawthorne, FNP for routine health maintenance.   Katherine Olson and I agreed with plan.   Mable Paris, FNP

## 2021-11-17 NOTE — Patient Instructions (Signed)
Very nice to see you today.  Please let me know if anything at all  Health Maintenance for Postmenopausal Women Menopause is a normal process in which your ability to get pregnant comes to an end. This process happens slowly over many months or years, usually between the ages of 60 and 62. Menopause is complete when you have missed your menstrual period for 12 months. It is important to talk with your health care provider about some of the most common conditions that affect women after menopause (postmenopausal women). These include heart disease, cancer, and bone loss (osteoporosis). Adopting a healthy lifestyle and getting preventive care can help to promote your health and wellness. The actions you take can also lower your chances of developing some of these common conditions. What are the signs and symptoms of menopause? During menopause, you may have the following symptoms: Hot flashes. These can be moderate or severe. Night sweats. Decrease in sex drive. Mood swings. Headaches. Tiredness (fatigue). Irritability. Memory problems. Problems falling asleep or staying asleep. Talk with your health care provider about treatment options for your symptoms. Do I need hormone replacement therapy? Hormone replacement therapy is effective in treating symptoms that are caused by menopause, such as hot flashes and night sweats. Hormone replacement carries certain risks, especially as you become older. If you are thinking about using estrogen or estrogen with progestin, discuss the benefits and risks with your health care provider. How can I reduce my risk for heart disease and stroke? The risk of heart disease, heart attack, and stroke increases as you age. One of the causes may be a change in the body's hormones during menopause. This can affect how your body uses dietary fats, triglycerides, and cholesterol. Heart attack and stroke are medical emergencies. There are many things that you can do to help  prevent heart disease and stroke. Watch your blood pressure High blood pressure causes heart disease and increases the risk of stroke. This is more likely to develop in people who have high blood pressure readings or are overweight. Have your blood pressure checked: Every 3-5 years if you are 24-82 years of age. Every year if you are 61 years old or older. Eat a healthy diet  Eat a diet that includes plenty of vegetables, fruits, low-fat dairy products, and lean protein. Do not eat a lot of foods that are high in solid fats, added sugars, or sodium. Get regular exercise Get regular exercise. This is one of the most important things you can do for your health. Most adults should: Try to exercise for at least 150 minutes each week. The exercise should increase your heart rate and make you sweat (moderate-intensity exercise). Try to do strengthening exercises at least twice each week. Do these in addition to the moderate-intensity exercise. Spend less time sitting. Even light physical activity can be beneficial. Other tips Work with your health care provider to achieve or maintain a healthy weight. Do not use any products that contain nicotine or tobacco. These products include cigarettes, chewing tobacco, and vaping devices, such as e-cigarettes. If you need help quitting, ask your health care provider. Know your numbers. Ask your health care provider to check your cholesterol and your blood sugar (glucose). Continue to have your blood tested as directed by your health care provider. Do I need screening for cancer? Depending on your health history and family history, you may need to have cancer screenings at different stages of your life. This may include screening for: Breast cancer. Cervical  cancer. Lung cancer. Colorectal cancer. What is my risk for osteoporosis? After menopause, you may be at increased risk for osteoporosis. Osteoporosis is a condition in which bone destruction happens  more quickly than new bone creation. To help prevent osteoporosis or the bone fractures that can happen because of osteoporosis, you may take the following actions: If you are 54-27 years old, get at least 1,000 mg of calcium and at least 600 international units (IU) of vitamin D per day. If you are older than age 57 but younger than age 22, get at least 1,200 mg of calcium and at least 600 international units (IU) of vitamin D per day. If you are older than age 62, get at least 1,200 mg of calcium and at least 800 international units (IU) of vitamin D per day. Smoking and drinking excessive alcohol increase the risk of osteoporosis. Eat foods that are rich in calcium and vitamin D, and do weight-bearing exercises several times each week as directed by your health care provider. How does menopause affect my mental health? Depression may occur at any age, but it is more common as you become older. Common symptoms of depression include: Feeling depressed. Changes in sleep patterns. Changes in appetite or eating patterns. Feeling an overall lack of motivation or enjoyment of activities that you previously enjoyed. Frequent crying spells. Talk with your health care provider if you think that you are experiencing any of these symptoms. General instructions See your health care provider for regular wellness exams and vaccines. This may include: Scheduling regular health, dental, and eye exams. Getting and maintaining your vaccines. These include: Influenza vaccine. Get this vaccine each year before the flu season begins. Pneumonia vaccine. Shingles vaccine. Tetanus, diphtheria, and pertussis (Tdap) booster vaccine. Your health care provider may also recommend other immunizations. Tell your health care provider if you have ever been abused or do not feel safe at home. Summary Menopause is a normal process in which your ability to get pregnant comes to an end. This condition causes hot flashes,  night sweats, decreased interest in sex, mood swings, headaches, or lack of sleep. Treatment for this condition may include hormone replacement therapy. Take actions to keep yourself healthy, including exercising regularly, eating a healthy diet, watching your weight, and checking your blood pressure and blood sugar levels. Get screened for cancer and depression. Make sure that you are up to date with all your vaccines. This information is not intended to replace advice given to you by your health care provider. Make sure you discuss any questions you have with your health care provider. Document Revised: 07/08/2020 Document Reviewed: 07/08/2020 Elsevier Patient Education  [place].

## 2021-11-17 NOTE — Assessment & Plan Note (Signed)
Chronic, stable.  Continue amlodipine 5 mg, losartan 50 mg

## 2021-11-17 NOTE — Assessment & Plan Note (Addendum)
Clinical breast preformed today.  Patient no longer screening for breast cancer.  She politely declines bone density .  Encouraged walking with son.

## 2021-11-17 NOTE — Assessment & Plan Note (Signed)
Anticipate controlled. Continue pravastatin '20mg'$ 

## 2021-12-04 ENCOUNTER — Telehealth: Payer: Self-pay

## 2021-12-04 ENCOUNTER — Encounter: Payer: Self-pay | Admitting: Podiatry

## 2021-12-04 ENCOUNTER — Ambulatory Visit: Payer: Medicare HMO | Admitting: Podiatry

## 2021-12-04 ENCOUNTER — Other Ambulatory Visit: Payer: Self-pay

## 2021-12-04 ENCOUNTER — Encounter: Payer: Self-pay | Admitting: Family

## 2021-12-04 DIAGNOSIS — G309 Alzheimer's disease, unspecified: Secondary | ICD-10-CM

## 2021-12-04 DIAGNOSIS — B351 Tinea unguium: Secondary | ICD-10-CM | POA: Diagnosis not present

## 2021-12-04 DIAGNOSIS — M79675 Pain in left toe(s): Secondary | ICD-10-CM

## 2021-12-04 DIAGNOSIS — R399 Unspecified symptoms and signs involving the genitourinary system: Secondary | ICD-10-CM

## 2021-12-04 DIAGNOSIS — F0280 Dementia in other diseases classified elsewhere without behavioral disturbance: Secondary | ICD-10-CM

## 2021-12-04 DIAGNOSIS — M79674 Pain in right toe(s): Secondary | ICD-10-CM | POA: Diagnosis not present

## 2021-12-04 NOTE — Telephone Encounter (Signed)
See previous note

## 2021-12-04 NOTE — Progress Notes (Signed)
This patient returns to the office for evaluation and treatment of long thick painful nails .  This patient is unable to trim her own nails since the patient cannot reach her feet.  Patient says the nails are painful walking and wearing her shoes.  She returns for preventive foot care services.   General Appearance  Alert, conversant and in no acute stress.  Vascular  Dorsalis pedis are weakly palpable  Bilaterally.  Posterior tibial pulses are absent  B/L.  Capillary return is within normal limits  bilaterally. Cold feet  Bilaterally. Absent digital hair.  Neurologic  Senn-Weinstein monofilament wire test within normal limits  bilaterally. Muscle power within normal limits bilaterally.  Nails Thick disfigured discolored nails with subungual debris  from hallux to fifth toes bilaterally. No evidence of bacterial infection or drainage bilaterally.  Orthopedic  No limitations of motion  feet .  No crepitus or effusions noted.  No bony pathology or digital deformities noted.  Skin  normotropic skin with no porokeratosis noted bilaterally.  No signs of infections or ulcers noted.   Listers corn fifth toe right foot.  Onychomycosis  Pain in toes right foot  Pain in toes left foot  Debridement  of nails  1-5  B/L with a nail nipper.  Nails were then filed using a dremel tool with no incidents.   RTC  4  months   Gardiner Barefoot DPM

## 2021-12-04 NOTE — Progress Notes (Signed)
Orders in 

## 2021-12-04 NOTE — Telephone Encounter (Signed)
Patient's son, Shadie Sweatman, called to state patient is having stinging after urination, which is a symptom she had in the past for UTI.  [name] states he would like to come pick up a cup for patient and bring back a sample if possible.  [name] states he would like to pick up the cup at 10am if it's ok.  Please call.

## 2021-12-04 NOTE — Telephone Encounter (Signed)
Spoke to Lucky and he stated that Mom Mrs Lakela is having UTI symptoms so I put in lab orders and he is going to come by to pick up cup to bring back and drop off

## 2022-01-30 ENCOUNTER — Telehealth: Payer: Self-pay | Admitting: Family

## 2022-01-30 NOTE — Telephone Encounter (Signed)
Pt son would like to be called in regards to the pt getting blood work

## 2022-01-30 NOTE — Telephone Encounter (Signed)
Spoke to pt son and he was wanting to know if Mom needed labs done but she had just had lab work done in Sept 2023.

## 2022-04-09 ENCOUNTER — Ambulatory Visit: Payer: Medicare HMO | Admitting: Podiatry

## 2022-04-10 ENCOUNTER — Telehealth: Payer: Self-pay | Admitting: Family

## 2022-04-10 DIAGNOSIS — I10 Essential (primary) hypertension: Secondary | ICD-10-CM

## 2022-04-10 NOTE — Telephone Encounter (Signed)
Pt need a refill on losartan sent to [facility]

## 2022-04-10 NOTE — Telephone Encounter (Signed)
RX sent to [facility].

## 2022-04-20 ENCOUNTER — Telehealth: Payer: Self-pay | Admitting: Family

## 2022-04-20 ENCOUNTER — Other Ambulatory Visit: Payer: Self-pay

## 2022-04-20 DIAGNOSIS — E785 Hyperlipidemia, unspecified: Secondary | ICD-10-CM

## 2022-04-20 MED ORDER — PRAVASTATIN SODIUM 20 MG PO TABS: 20.0000 mg | ORAL_TABLET | Freq: Every day | ORAL | 2 refills | Status: DC

## 2022-04-20 NOTE — Telephone Encounter (Signed)
Pt need a refill on pravastatin sent to [facility]

## 2022-04-20 NOTE — Telephone Encounter (Signed)
sent

## 2022-04-22 ENCOUNTER — Telehealth: Payer: Self-pay | Admitting: Family

## 2022-04-22 NOTE — Telephone Encounter (Signed)
[place] to schedule their annual wellness visit. Appointment made for 05/01/2022.  Thank you,  [employer] Direct dial  [phone]

## 2022-04-23 ENCOUNTER — Ambulatory Visit: Payer: Medicare HMO | Admitting: Podiatry

## 2022-04-23 ENCOUNTER — Encounter: Payer: Self-pay | Admitting: Podiatry

## 2022-04-23 VITALS — BP 152/68 | HR 64

## 2022-04-23 DIAGNOSIS — B351 Tinea unguium: Secondary | ICD-10-CM | POA: Diagnosis not present

## 2022-04-23 DIAGNOSIS — M79674 Pain in right toe(s): Secondary | ICD-10-CM

## 2022-04-23 DIAGNOSIS — F0280 Dementia in other diseases classified elsewhere without behavioral disturbance: Secondary | ICD-10-CM

## 2022-04-23 DIAGNOSIS — M79675 Pain in left toe(s): Secondary | ICD-10-CM

## 2022-04-23 NOTE — Progress Notes (Signed)
This patient returns to the office for evaluation and treatment of long thick painful nails .  This patient is unable to trim her own nails since the patient cannot reach her feet.  Patient says the nails are painful walking and wearing her shoes.  She returns for preventive foot care services.   General Appearance  Alert, conversant and in no acute stress.  Vascular  Dorsalis pedis are weakly palpable  Bilaterally.  Posterior tibial pulses are absent  B/L.  Capillary return is within normal limits  bilaterally. Cold feet  Bilaterally. Absent digital hair.  Neurologic  Senn-Weinstein monofilament wire test within normal limits  bilaterally. Muscle power within normal limits bilaterally.  Nails Thick disfigured discolored nails with subungual debris  from hallux to fifth toes bilaterally. No evidence of bacterial infection or drainage bilaterally.  Orthopedic  No limitations of motion  feet .  No crepitus or effusions noted.  No bony pathology or digital deformities noted.  Skin  normotropic skin with no porokeratosis noted bilaterally.  No signs of infections or ulcers noted.   Listers corn fifth toe right foot.  Onychomycosis  Pain in toes right foot  Pain in toes left foot  Debridement  of nails  1-5  B/L with a nail nipper.  Nails were then filed using a dremel tool with no incidents.   RTC  3  months   Gardiner Barefoot DPM

## 2022-05-01 ENCOUNTER — Telehealth: Payer: Self-pay | Admitting: Family

## 2022-05-01 ENCOUNTER — Other Ambulatory Visit: Payer: Self-pay | Admitting: Family

## 2022-05-01 ENCOUNTER — Ambulatory Visit (INDEPENDENT_AMBULATORY_CARE_PROVIDER_SITE_OTHER): Payer: Medicare HMO

## 2022-05-01 VITALS — Ht 66.0 in | Wt 154.0 lb

## 2022-05-01 DIAGNOSIS — Z0000 Encounter for general adult medical examination without abnormal findings: Secondary | ICD-10-CM

## 2022-05-01 DIAGNOSIS — N952 Postmenopausal atrophic vaginitis: Secondary | ICD-10-CM

## 2022-05-01 MED ORDER — PREMARIN 0.625 MG/GM VA CREA: TOPICAL_CREAM | VAGINAL | 2 refills | Status: DC

## 2022-05-01 NOTE — Patient Instructions (Addendum)
Katherine Olson , Thank you for taking time to come for your Medicare Wellness Visit. I appreciate your ongoing commitment to your health goals. Please review the following plan we discussed and let me know if I can assist you in the future.   These are the goals we discussed:  Goals      Healthy Lifestyle     Stay hydrated and drink plenty of water. Drink larger amounts earlier in the day to help avoid night time accidents. Low carb foods.  Vegetables and lean meats. Stay active and continue doing yard work.   Start walking outside with son as the weather warms and as tolerated.        This is a list of the screening recommended for you and due dates:  Health Maintenance  Topic Date Due   COVID-19 Vaccine (5 - 2023-24 season) 05/17/2022*   Medicare Annual Wellness Visit  05/01/2023   DTaP/Tdap/Td vaccine (2 - Tdap) 05/15/2024   Pneumonia Vaccine  Completed   Flu Shot  Completed   DEXA scan (bone density measurement)  Completed   Zoster (Shingles) Vaccine  Completed   HPV Vaccine  Aged Out  *Topic was postponed. The date shown is not the original due date.    Advanced directives: on file  Conditions/risks identified: none new  Next appointment: Follow up in one year for your annual wellness visit    Preventive Care 65 Years and Older, Female Preventive care refers to lifestyle choices and visits with your health care provider that can promote health and wellness. What does preventive care include? A yearly physical exam. This is also called an annual well check. Dental exams once or twice a year. Routine eye exams. Ask your health care provider how often you should have your eyes checked. Personal lifestyle choices, including: Daily care of your teeth and gums. Regular physical activity. Eating a healthy diet. Avoiding tobacco and drug use. Limiting alcohol use. Practicing safe sex. Taking low-dose aspirin every day. Taking vitamin and mineral supplements as recommended  by your health care provider. What happens during an annual well check? The services and screenings done by your health care provider during your annual well check will depend on your age, overall health, lifestyle risk factors, and family history of disease. Counseling  Your health care provider may ask you questions about your: Alcohol use. Tobacco use. Drug use. Emotional well-being. Home and relationship well-being. Sexual activity. Eating habits. History of falls. Memory and ability to understand (cognition). Work and work Statistician. Reproductive health. Screening  You may have the following tests or measurements: Height, weight, and BMI. Blood pressure. Lipid and cholesterol levels. These may be checked every 5 years, or more frequently if you are over 45 years old. Skin check. Lung cancer screening. You may have this screening every year starting at age 38 if you have a 30-pack-year history of smoking and currently smoke or have quit within the past 15 years. Fecal occult blood test (FOBT) of the stool. You may have this test every year starting at age 72. Flexible sigmoidoscopy or colonoscopy. You may have a sigmoidoscopy every 5 years or a colonoscopy every 10 years starting at age 35. Hepatitis C blood test. Hepatitis B blood test. Sexually transmitted disease (STD) testing. Diabetes screening. This is done by checking your blood sugar (glucose) after you have not eaten for a while (fasting). You may have this done every 1-3 years. Bone density scan. This is done to screen for osteoporosis. You may  have this done starting at age 35. Mammogram. This may be done every 1-2 years. Talk to your health care provider about how often you should have regular mammograms. Talk with your health care provider about your test results, treatment options, and if necessary, the need for more tests. Vaccines  Your health care provider may recommend certain vaccines, such as: Influenza  vaccine. This is recommended every year. Tetanus, diphtheria, and acellular pertussis (Tdap, Td) vaccine. You may need a Td booster every 10 years. Zoster vaccine. You may need this after age 35. Pneumococcal 13-valent conjugate (PCV13) vaccine. One dose is recommended after age 31. Pneumococcal polysaccharide (PPSV23) vaccine. One dose is recommended after age 19. Talk to your health care provider about which screenings and vaccines you need and how often you need them. This information is not intended to replace advice given to you by your health care provider. Make sure you discuss any questions you have with your health care provider. Document Released: 03/15/2015 Document Revised: 11/06/2015 Document Reviewed: 12/18/2014 Elsevier Interactive Patient Education  2017 [place] Prevention in the Home Falls can cause injuries. They can happen to people of all ages. There are many things you can do to make your home safe and to help prevent falls. What can I do on the outside of my home? Regularly fix the edges of walkways and driveways and fix any cracks. Remove anything that might make you trip as you walk through a door, such as a raised step or threshold. Trim any bushes or trees on the path to your home. Use bright outdoor lighting. Clear any walking paths of anything that might make someone trip, such as rocks or tools. Regularly check to see if handrails are loose or broken. Make sure that both sides of any steps have handrails. Any raised decks and porches should have guardrails on the edges. Have any leaves, snow, or ice cleared regularly. Use sand or salt on walking paths during winter. Clean up any spills in your garage right away. This includes oil or grease spills. What can I do in the bathroom? Use night lights. Install grab bars by the toilet and in the tub and shower. Do not use towel bars as grab bars. Use non-skid mats or decals in the tub or shower. If you  need to sit down in the shower, use a plastic, non-slip stool. Keep the floor dry. Clean up any water that spills on the floor as soon as it happens. Remove soap buildup in the tub or shower regularly. Attach bath mats securely with double-sided non-slip rug tape. Do not have throw rugs and other things on the floor that can make you trip. What can I do in the bedroom? Use night lights. Make sure that you have a light by your bed that is easy to reach. Do not use any sheets or blankets that are too big for your bed. They should not hang down onto the floor. Have a firm chair that has side arms. You can use this for support while you get dressed. Do not have throw rugs and other things on the floor that can make you trip. What can I do in the kitchen? Clean up any spills right away. Avoid walking on wet floors. Keep items that you use a lot in easy-to-reach places. If you need to reach something above you, use a strong step stool that has a grab bar. Keep electrical cords out of the way. Do not use floor polish  or wax that makes floors slippery. If you must use wax, use non-skid floor wax. Do not have throw rugs and other things on the floor that can make you trip. What can I do with my stairs? Do not leave any items on the stairs. Make sure that there are handrails on both sides of the stairs and use them. Fix handrails that are broken or loose. Make sure that handrails are as long as the stairways. Check any carpeting to make sure that it is firmly attached to the stairs. Fix any carpet that is loose or worn. Avoid having throw rugs at the top or bottom of the stairs. If you do have throw rugs, attach them to the floor with carpet tape. Make sure that you have a light switch at the top of the stairs and the bottom of the stairs. If you do not have them, ask someone to add them for you. What else can I do to help prevent falls? Wear shoes that: Do not have high heels. Have rubber  bottoms. Are comfortable and fit you well. Are closed at the toe. Do not wear sandals. If you use a stepladder: Make sure that it is fully opened. Do not climb a closed stepladder. Make sure that both sides of the stepladder are locked into place. Ask someone to hold it for you, if possible. Clearly mark and make sure that you can see: Any grab bars or handrails. First and last steps. Where the edge of each step is. Use tools that help you move around (mobility aids) if they are needed. These include: Canes. Walkers. Scooters. Crutches. Turn on the lights when you go into a dark area. Replace any light bulbs as soon as they burn out. Set up your furniture so you have a clear path. Avoid moving your furniture around. If any of your floors are uneven, fix them. If there are any pets around you, be aware of where they are. Review your medicines with your doctor. Some medicines can make you feel dizzy. This can increase your chance of falling. Ask your doctor what other things that you can do to help prevent falls. This information is not intended to replace advice given to you by your health care provider. Make sure you discuss any questions you have with your health care provider. Document Released: 12/13/2008 Document Revised: 07/25/2015 Document Reviewed: 03/23/2014 Elsevier Interactive Patient Education  2017 [employer].

## 2022-05-01 NOTE — Progress Notes (Signed)
Subjective:   Katherine Olson is a 89 y.o. female who presents for an Initial Medicare Annual Wellness Visit.  Review of Systems    No ROS.  Medicare Wellness Virtual Visit.  Visual/audio telehealth visit, UTA vital signs.   See social history for additional risk factors.   Cardiac Risk Factors include: advanced age (>22mn, >>70women)     Objective:    Today's Vitals   05/01/22 1017  Weight: 154 lb (69.9 kg)  Height: '5\' 6"'$  (1.676 m)   Body mass index is 24.86 kg/m.     05/01/2022   10:28 AM 07/01/2021    7:08 PM 12/28/2017    1:08 PM 11/06/2016    2:06 PM 08/08/2015    8:54 AM 11/02/2013   10:03 AM  Advanced Directives  Does Patient Have a Medical Advance Directive? Yes No Yes Yes Yes Yes  Type of AParamedicof AClydeLiving will  HFivepointvilleLiving will Healthcare Power of AWhitmanLiving will HKerhonksonLiving will  Does patient want to make changes to medical advance directive? No - Patient declined   Yes (MAU/Ambulatory/Procedural Areas - Information given)  No - Patient declined  Copy of HKirbyvillein Chart? Yes - validated most recent copy scanned in chart (See row information)   No - copy requested Yes Yes  Would patient like information on creating a medical advance directive?  No - Patient declined        Current Medications (verified) Outpatient Encounter Medications as of 05/01/2022  Medication Sig   amLODipine (NORVASC) 5 MG tablet Take 1 tablet (5 mg total) by mouth every morning.   citalopram (CELEXA) 20 MG tablet TAKE 1 TABLET(20 MG) BY MOUTH EVERY MORNING   Coenzyme Q-10 200 MG CAPS Take by mouth.   losartan (COZAAR) 50 MG tablet TAKE 1 TABLET BY MOUTH EVERY DAY   Multiple Vitamins-Minerals (MULTIVITAMIN WITH MINERALS) tablet Take 1 tablet by mouth daily.   Omega-3 Fatty Acids (FISH OIL PO) Take by mouth.   pravastatin (PRAVACHOL) 20 MG tablet Take 1 tablet  (20 mg total) by mouth daily.   PREMARIN vaginal cream PLACE 1 APPLICATORFUL VAGINALLY ONCE DAILY   terconazole (TERAZOL 3) 0.8 % vaginal cream Place 1 applicator vaginally at bedtime. For 3 days   No facility-administered encounter medications on file as of 05/01/2022.    Allergies (verified) Simvastatin   History: Past Medical History:  Diagnosis Date   Depression    Hyperlipidemia    Hypertension    Past Surgical History:  Procedure Laterality Date   ABDOMINAL HYSTERECTOMY     partial   JOINT REPLACEMENT Right 1991   hip   Family History  Problem Relation Age of Onset   Alzheimer's disease Brother    Heart disease Sister    Cancer Sister        kidney   Cancer Mother        liver   Cancer Father        prostate   Social History   Socioeconomic History   Marital status: Widowed    Spouse name: Not on file   Number of children: Not on file   Years of education: Not on file   Highest education level: Not on file  Occupational History    Employer: RETIRED    Comment: housewife  Tobacco Use   Smoking status: Never   Smokeless tobacco: Never  Substance and Sexual Activity  Alcohol use: No   Drug use: No   Sexual activity: Not Currently    Birth control/protection: Surgical  Other Topics Concern   Not on file  Social History Narrative   Son - Kevona Bush      Her son lives with her and cooks her meals   Her son also does her finances   Social Determinants of Health   Financial Resource Strain: Low Risk  (05/01/2022)   Overall Financial Resource Strain (CARDIA)    Difficulty of Paying Living Expenses: Not hard at all  Food Insecurity: No Food Insecurity (05/01/2022)   Hunger Vital Sign    Worried About Running Out of Food in the Last Year: Never true    [place] in the Last Year: Never true  Transportation Needs: No Transportation Needs (05/01/2022)   PRAPARE - Hydrologist (Medical): No    Lack of Transportation  (Non-Medical): No  Physical Activity: Insufficiently Active (05/01/2022)   Exercise Vital Sign    Days of Exercise per Week: 5 days    Minutes of Exercise per Session: 10 min  Stress: No Stress Concern Present (05/01/2022)   [place]    Feeling of Stress : Not at all  Social Connections: Unknown (05/01/2022)   Social Connection and Isolation Panel [NHANES]    Frequency of Communication with Friends and Family: More than three times a week    Frequency of Social Gatherings with Friends and Family: More than three times a week    Attends Religious Services: Not on Advertising copywriter or Organizations: Not on file    Attends Archivist Meetings: Not on file    Marital Status: Not on file    Tobacco Counseling Counseling given: Not Answered   Clinical Intake:  Pre-visit preparation completed: Yes           How often do you need to have someone help you when you read instructions, pamphlets, or other written materials from your doctor or pharmacy?: [place] Needed?: No      Activities of Daily Living    05/01/2022   10:29 AM  In your present state of health, do you have any difficulty performing the following activities:  Hearing? 0  Vision? 0  Difficulty concentrating or making decisions? 0  Walking or climbing stairs? 1  Dressing or bathing? 1  Doing errands, shopping? 1  Preparing Food and eating ? Y  Comment Family meal preps. Self feeds.  Using the Toilet? Y  In the past six months, have you accidently leaked urine? Y  Comment Managed with daily depend brief  Do you have problems with loss of bowel control? N  Managing your Medications? Y  Comment Family assist  Managing your Finances? Y  Comment Family assist  Housekeeping or managing your Housekeeping? Y  Comment Family assist    Patient Care Team: Burnard Hawthorne, FNP as PCP - General (Family  Medicine)  Indicate any recent Medical Services you may have received from other than [facility] providers in the past year (date may be approximate).     Assessment:   This is a routine wellness examination for Katherine Olson.  I connected with  Katherine DAR on 05/01/22 by a audio enabled telemedicine application and verified that I am speaking with the correct person using two identifiers.  Information received from son HIPAA  complaint.   Patient Location: Home  Provider Location: Office/Clinic  I discussed the limitations of evaluation and management by telemedicine. The patient expressed understanding and agreed to proceed.   Hearing/Vision screen Hearing Screening - Comments:: Passed the whisper test. Some complaints of difficulty hearing in crowds. Audiology deferred. Educational material provided. Vision Screening - Comments:: Followed [facility]  Single cataract extracted  Wears glasses daily   Dietary issues and exercise activities discussed: Current Exercise Habits: Home exercise routine, Type of exercise: walking, Time (Minutes): 10, Frequency (Times/Week): 5, Weekly Exercise (Minutes/Week): 50, Intensity: Mild   Goals Addressed             This Visit's Progress    Healthy Lifestyle       Stay hydrated and drink plenty of water. Drink larger amounts earlier in the day to help avoid night time accidents. Low carb foods.  Vegetables and lean meats. Stay active and continue doing yard work.   Start walking outside with son as the weather warms and as tolerated.       Depression Screen    05/01/2022   10:25 AM 11/17/2021    9:25 AM 06/13/2021    3:20 PM 12/16/2020   10:38 AM 02/05/2020   11:18 AM 12/13/2017   10:38 AM 11/06/2016    2:06 PM  PHQ 2/9 Scores  PHQ - 2 Score 0 3 0 0  1   PHQ- 9 Score   0 0  2   Exception Documentation     Other- indicate reason in comment box  Other- indicate reason in comment box  Not completed       [name], patient's son answered  screening assessment questions.     Fall Risk    05/01/2022   10:44 AM 11/17/2021    9:24 AM 06/13/2021    3:20 PM 12/16/2020   10:38 AM 04/07/2019   10:13 AM  Fall Risk   Falls in the past year? 0 0 0 0 1  Number falls in past yr: 0 0 0 0 0  Injury with Fall?  0 0 0 1  Risk for fall due to :  No Fall Risks No Fall Risks Impaired balance/gait   Risk for fall due to: Comment Walker in use      Follow up Falls evaluation completed;Falls prevention discussed Falls evaluation completed Falls evaluation completed Falls evaluation completed Falls evaluation completed    FALL RISK PREVENTION PERTAINING TO THE HOME: Home free of loose throw rugs in walkways, pet beds, electrical cords, etc? Yes  Adequate lighting in your home to reduce risk of falls? Yes   ASSISTIVE DEVICES UTILIZED TO PREVENT FALLS: Life alert? No  Use of a cane, walker or w/c? Yes  Grab bars in the bathroom? Yes  Shower chair or bench in shower? Yes  Elevated toilet seat or a handicapped toilet? Yes   TIMED UP AND GO: Was the test performed? No .   Cognitive Function: Dx Memory loss. Followed by pcp. Son manages her medications, finances, home management.  Declines additional follow up at this time.      05/01/2022   10:29 AM 08/08/2015    9:16 AM 04/06/2014    9:03 AM 12/18/2013   10:20 AM  MMSE - Mini Mental State Exam  Not completed: Unable to complete Refused    Orientation to time   2 4  Orientation to Place   2 4  Registration   3 3  Attention/ Calculation  5 5  Recall   0 0  Language- name 2 objects   2 2  Language- repeat   1 1  Language- follow 3 step command   3 3  Language- read & follow direction   1 1  Write a sentence   1 1  Copy design   1 1  Total score   21 25        Immunizations Immunization History  Administered Date(s) Administered   Fluad Quad(high Dose 65+) 11/17/2021   Influenza Inj Mdck Quad Pf 12/15/2018   Influenza, High Dose Seasonal PF 03/17/2017   Influenza,inj,Quad  PF,6+ Mos 12/18/2013, 12/06/2017   Influenza-Unspecified 11/26/2015, 12/15/2018, 12/13/2019, 12/06/2020   PFIZER(Purple Top)SARS-COV-2 Vaccination 04/23/2019, 05/17/2019, 11/27/2019   Pfizer Covid-19 Vaccine Bivalent Booster 50yr & up 12/13/2000   Pneumococcal Conjugate-13 12/18/2013   Pneumococcal Polysaccharide-23 12/13/2017   Pneumococcal-Unspecified 04/23/2009   Td 05/16/2014   Zoster Recombinat (Shingrix) 03/17/2018, 03/17/2018, 12/22/2018, 12/22/2018   Screening Tests Health Maintenance  Topic Date Due   COVID-19 Vaccine (5 - 2023-24 season) 05/17/2022 (Originally 10/31/2021)   Medicare Annual Wellness (AWV)  05/01/2023   DTaP/Tdap/Td (2 - Tdap) 05/15/2024   Pneumonia Vaccine 89 Years old  Completed   INFLUENZA VACCINE  Completed   DEXA SCAN  Completed   Zoster Vaccines- Shingrix  Completed   HPV VACCINES  Aged Out    Health Maintenance There are no preventive care reminders to display for this patient.  Lung Cancer Screening: (Low Dose CT Chest recommended if Age 87-80years, 30 pack-year currently smoking OR have quit w/in 15years.) does not qualify.   Hepatitis C Screening: does not qualify.  Vision Screening: Recommended annual ophthalmology exams for early detection of glaucoma and other disorders of the eye.  Dental Screening: Recommended annual dental exams for proper oral hygiene.  Community Resource Referral / Chronic Care Management: CRR required this visit?  No   CCM required this visit?  No      Plan:     I have personally reviewed and noted the following in the patient's chart:   Medical and social history Use of alcohol, tobacco or illicit drugs  Current medications and supplements including opioid prescriptions. Patient is not currently taking opioid prescriptions. Functional ability and status Nutritional status Physical activity Advanced directives List of other physicians Hospitalizations, surgeries, and ER visits in previous 12  months Vitals Screenings to include cognitive, depression, and falls Referrals and appointments  In addition, I have reviewed and discussed with patient certain preventive protocols, quality metrics, and best practice recommendations. A written personalized care plan for preventive services as well as general preventive health recommendations were provided to patient.     DLeta Jungling LPN   [id number]

## 2022-05-01 NOTE — Telephone Encounter (Signed)
Prescription Request  05/01/2022   LOV: 11/17/2021  What is the name of the medication or equipment? conjugated estrogens (PREMARIN) vaginal cream  Have you contacted your pharmacy to request a refill? Yes   Which pharmacy would you like this sent to?    [facility] [place], [address] AT [place] [address] Phone: [phone] Fax: [phone]    Patient notified that their request is being sent to the clinical staff for review and that they should receive a response within 2 business days.   Please advise at Mobile [phone] (mobile)

## 2022-07-04 ENCOUNTER — Other Ambulatory Visit: Payer: Self-pay | Admitting: Family

## 2022-07-04 DIAGNOSIS — F419 Anxiety disorder, unspecified: Secondary | ICD-10-CM

## 2022-08-25 ENCOUNTER — Telehealth: Payer: Self-pay | Admitting: Family

## 2022-08-25 DIAGNOSIS — I10 Essential (primary) hypertension: Secondary | ICD-10-CM

## 2022-08-25 MED ORDER — AMLODIPINE BESYLATE 5 MG PO TABS: 5.0000 mg | ORAL_TABLET | Freq: Every morning | ORAL | 3 refills | Status: DC

## 2022-08-25 NOTE — Telephone Encounter (Signed)
RX sent in to pharmacy pt is aware  

## 2022-08-25 NOTE — Telephone Encounter (Signed)
Prescription Request  08/25/2022  LOV: 11/17/2021  What is the name of the medication or equipment? amLODipine   Have you contacted your pharmacy to request a refill? Yes   Which pharmacy would you like this sent to?  [facility]  Patient notified that their request is being sent to the clinical staff for review and that they should receive a response within 2 business days.   Please advise at Mobile [phone] (mobile)

## 2022-08-26 ENCOUNTER — Telehealth: Payer: Self-pay

## 2022-08-26 DIAGNOSIS — I10 Essential (primary) hypertension: Secondary | ICD-10-CM

## 2022-08-26 MED ORDER — LOSARTAN POTASSIUM 50 MG PO TABS: 50.0000 mg | ORAL_TABLET | Freq: Every day | ORAL | 0 refills | Status: AC

## 2022-08-26 MED ORDER — AMLODIPINE BESYLATE 5 MG PO TABS: 5.0000 mg | ORAL_TABLET | Freq: Every morning | ORAL | 3 refills | Status: DC

## 2022-08-26 NOTE — Telephone Encounter (Signed)
Patient's son, Jilliana Burkes, called to state patient's pharmacy has not received prescription for patient's  amLODipine (NORVASC) 5 MG tablet.  I let patient know that we sent it to [facility] and they confirmed receipt yesterday.  [name] states he would like for it to go to [facility] in [place].  [name] wanted to know why it was sent to [facility].  I spoke with [name], CMA, and transferred call to her.

## 2022-08-26 NOTE — Telephone Encounter (Signed)
Spoke to pt son and got all meds transferred to [facility]  in [place] @ [address]

## 2022-08-26 NOTE — Telephone Encounter (Signed)
Change pharmacy in pt chart

## 2022-09-24 ENCOUNTER — Ambulatory Visit: Payer: Medicare HMO | Admitting: Podiatry

## 2022-10-01 ENCOUNTER — Ambulatory Visit: Payer: Medicare HMO | Admitting: Podiatry

## 2022-10-15 ENCOUNTER — Encounter (INDEPENDENT_AMBULATORY_CARE_PROVIDER_SITE_OTHER): Payer: Self-pay

## 2022-10-26 ENCOUNTER — Telehealth: Payer: Self-pay | Admitting: Family

## 2022-10-26 DIAGNOSIS — E785 Hyperlipidemia, unspecified: Secondary | ICD-10-CM

## 2022-10-26 MED ORDER — PRAVASTATIN SODIUM 20 MG PO TABS: 20.0000 mg | ORAL_TABLET | Freq: Every day | ORAL | 2 refills | Status: AC

## 2022-10-26 NOTE — Telephone Encounter (Signed)
Patient just called. And states she is out of her prescription. The name is pravastatin (PRAVACHOL) 20 MG tablet. She is aware she has an upcoming appointment in Sept. But she is going to be out by than. The pharmacy she uses is [id number] [place], [place] - [address] AT [facility] OF [address] [facility] [address] [place], [place] [address] Phone: [phone]  Fax: [phone] DEA #: [id number]  Her number is [phone].

## 2022-10-26 NOTE — Telephone Encounter (Signed)
Spoke to pt son and let him know I sent in refill

## 2022-11-01 ENCOUNTER — Other Ambulatory Visit: Payer: Self-pay | Admitting: Family

## 2022-11-01 DIAGNOSIS — F419 Anxiety disorder, unspecified: Secondary | ICD-10-CM

## 2022-11-09 ENCOUNTER — Other Ambulatory Visit: Payer: Self-pay

## 2022-11-09 ENCOUNTER — Ambulatory Visit
Admission: EM | Admit: 2022-11-09 | Discharge: 2022-11-09 | Disposition: A | Discharge status: Home / Self Care | Payer: Medicare HMO | Attending: Emergency Medicine | Admitting: Emergency Medicine

## 2022-11-09 ENCOUNTER — Telehealth: Payer: Self-pay

## 2022-11-09 ENCOUNTER — Other Ambulatory Visit: Payer: Self-pay | Admitting: Family

## 2022-11-09 DIAGNOSIS — F419 Anxiety disorder, unspecified: Secondary | ICD-10-CM

## 2022-11-09 DIAGNOSIS — N3000 Acute cystitis without hematuria: Secondary | ICD-10-CM | POA: Diagnosis not present

## 2022-11-09 DIAGNOSIS — R319 Hematuria, unspecified: Secondary | ICD-10-CM

## 2022-11-09 LAB — POCT URINALYSIS DIP (MANUAL ENTRY)
Bilirubin, UA: NEGATIVE
Glucose, UA: NEGATIVE mg/dL
Ketones, POC UA: NEGATIVE mg/dL
Leukocytes, UA: NEGATIVE
Nitrite, UA: POSITIVE — AB
Protein Ur, POC: NEGATIVE mg/dL
Spec Grav, UA: 1.02 (ref 1.010–1.025)
Urobilinogen, UA: 0.2 U/dL
pH, UA: 7 (ref 5.0–8.0)

## 2022-11-09 MED ORDER — COVID-19 MRNA VAC-TRIS(PFIZER) 30 MCG/0.3ML IM SUSY
0.3000 mL | PREFILLED_SYRINGE | Freq: Once | INTRAMUSCULAR | 0 refills | Status: AC
Start: 2022-11-09 — End: 2022-11-10
  Filled 2022-11-09: qty 0.3, 1d supply, fill #0

## 2022-11-09 MED ORDER — CITALOPRAM HYDROBROMIDE 20 MG PO TABS: ORAL_TABLET | ORAL | 3 refills | Status: DC

## 2022-11-09 MED ORDER — ONDANSETRON 4 MG PO TBDP
4.0000 mg | ORAL_TABLET | Freq: Once | ORAL | Status: AC
  Administered 2022-11-09: 4 mg via ORAL

## 2022-11-09 MED ORDER — ONDANSETRON 4 MG PO TBDP: 4.0000 mg | ORAL_TABLET | Freq: Three times a day (TID) | ORAL | 0 refills | Status: DC | PRN

## 2022-11-09 MED ORDER — CEPHALEXIN 500 MG PO CAPS: 500.0000 mg | ORAL_CAPSULE | Freq: Two times a day (BID) | ORAL | 0 refills | Status: AC

## 2022-11-09 NOTE — Discharge Instructions (Addendum)
Your urinalysis shows nitrates which is a enzyme released from bacteria , your urine will be sent to the lab to determine exactly which bacteria is present, if any changes need to be made to your medications you will be notified  Begin use of cephalexin every morning and every evening for 5 days  May use Zofran every 8 hours as needed for nausea and vomiting, placed under tongue and allow to dissolve  Increase your fluid intake through use of water  As always practice good hygiene, wiping front to back and avoidance of scented vaginal products to prevent further irritation  If symptoms continue to persist after use of medication or recur please follow-up with urgent care or your primary doctor as needed

## 2022-11-09 NOTE — Telephone Encounter (Signed)
Called [name] pt son , coincidentally he was here at the office. I spoke to him in person and he was explaining to me that otw here Mom had gotten sick in car and was throwing up and wanted someone to see her, I was explaining to him that we did not have any appt open today in office to offered him to go to [facility] or ED . He refused to go to either because he felt like since he was here she should be able to be seen. I then transferred the conversation to [place] and Mrs Erie Noe  to see if they could better explain to him that she needed to be seen today. Upon speaking to Mrs Erie Noe  after he had left he did agree to go to UC to get Mom checked out.

## 2022-11-09 NOTE — Telephone Encounter (Signed)
I concur with Katherine Olson's note as I was with her during this conversation. In reviewing chart, pt did arrive at urgent care to be evaluated. Katherine Olson also took the urine specimen that was collected today over to urgent care as well.  Will send note to doc of the day & CMA to check on UC note this afternoon.

## 2022-11-09 NOTE — Telephone Encounter (Signed)
Spoke to Katherine Olson and he would like Katherine Olson to get tested for UTI and will drop off urine sample to office, does not want Katherine Olson getting any MRI, CT or anything of that nature he understands Katherine Olson has dementia but is 89 years old  does not want to subject her to anything unneccessary   Patient's son, Katherine Olson, states patient is experiencing weakness, dementia, often has confusion when she wakes up.  Katherine Olson states getting patient up and ready to do anything including getting her to the car is very difficult.  Katherine Olson states he would like to know if it would be possible for someone to come to patient's house to draw blood and then have a virtual visit with Katherine Plowman, FNP.  Katherine Olson states he was wondering yesterday if patient may have a UTI, but this morning she seems fine.   Patient states he would like for Katherine Plowman, FNP's, nurse to please call him.

## 2022-11-09 NOTE — Telephone Encounter (Signed)
Prescription Request  11/09/2022  LOV: Visit date not found  What is the name of the medication or equipment? citalopram (CELEXA) 20 MG tablet  Have you contacted your pharmacy to request a refill? Yes   Which pharmacy would you like this sent to?   [id number] [place], [place] - [address] AT [facility] OF [address] [place] [address] Phone: [phone] Fax: [phone]   Patient notified that their request is being sent to the clinical staff for review and that they should receive a response within 2 business days.   Please advise at Mobile [phone] (mobile)  Patient's son, Kimball Vander, called to state he would like for [place] to please call him.  [name] states [facility] states they reached out to [place] to refill patient's citalopram (CELEXA) 20 MG tablet and they have not heard back from [place].  [name] states patient will be out of this medication in the next couple of days.

## 2022-11-09 NOTE — Telephone Encounter (Signed)
Patient's son, Dezerae Furtak, states patient is experiencing weakness, dementia, often has confusion when she wakes up.  [name] states getting patient up and ready to do anything including getting her to the car is very difficult.  [name] states he would like to know if it would be possible for someone to come to patient's house to draw blood and then have a virtual visit with Rennie Plowman, FNP.  [name] states he was wondering yesterday if patient may have a UTI, but this morning she seems fine.  Patient states he would like for Rennie Plowman, FNP's, nurse to please call him.

## 2022-11-09 NOTE — Telephone Encounter (Signed)
The patient's son came into the office today needing an appointment for his mother. I informed the patient's son that we did not have any available appointments today. However, the patient needed to be evaluated due to her throwing up before arriving at the office. I stated he could take her across the street to the Urgent Care and we would be able to follow her care,being that they are a [facility] facility . He stated, what if she does not want to go. I stated that I would go to the car and speak with Katherine Olson . We went to the car, I spoke with the patient. She agreed to go to Urgent Care. We will follow up.

## 2022-11-09 NOTE — Telephone Encounter (Signed)
Rx sent in to pharmacy pt son Katherine Olson is aware

## 2022-11-09 NOTE — ED Triage Notes (Signed)
More confused the prior 2 days.  Patient has dementia.  Patient vomited in car.  Family member reports PCP instructed them to come to ucc.

## 2022-11-09 NOTE — Telephone Encounter (Signed)
Patient was evaluated at urgent care and diagnosed with a UTI.  They sent in antibiotics.  Patient will need to follow-up with PCP in the next couple of weeks.  Please have somebody contact them tomorrow to see how she is doing.

## 2022-11-09 NOTE — ED Provider Notes (Signed)
Renaldo Fiddler    CSN: [id number] Arrival date & time: 11/09/22  1243      History   Chief Complaint No chief complaint on file.   HPI Katherine Olson is a 89 y.o. female.   Patient presents for evaluation of increased confusion occurring in the mornings for the last 2 days.  At baseline this morning.  History of dementia.  Had reached out to primary doctor with concerns regarding urinary infection and was going to drop off urine sample collected at home when patient vomited x 1 in the car in which primary doctor recommended in person evaluation at urgent care.  Has been tolerating food and liquids.  Denies urinary symptoms, fevers, URI symptoms, abdominal symptoms.  Has not attempted treatment of vomiting.  History obtained from son who is caregiver  Past Medical History:  Diagnosis Date   Depression    Hyperlipidemia    Hypertension     Patient Active Problem List   Diagnosis Date Noted   Acute vaginitis 05/06/2021   Acute cystitis with hematuria 02/19/2021   Decreased GFR 02/19/2021   Corns and callosities 04/18/2020   Right renal mass 02/05/2020   Pain due to onychomycosis of toenails of both feet 08/14/2019   BPPV (benign paroxysmal positional vertigo) 12/13/2017   Anxiety and depression 12/13/2017   Osteopenia 03/17/2017   Nausea & vomiting 08/02/2016   Atrophic vaginitis 08/08/2015   Vulvar cysts 08/06/2015   Routine physical examination 08/19/2014   Cataract 04/06/2014   Alzheimer's disease (HCC) 04/06/2014   Essential hypertension 04/06/2014   Urge incontinence 11/02/2013   Memory loss 11/02/2013   Hyperlipidemia     Past Surgical History:  Procedure Laterality Date   ABDOMINAL HYSTERECTOMY     partial   JOINT REPLACEMENT Right 1991   hip    OB History     Gravida  2   Para  2   Term  2   Preterm      AB      Living  2      SAB      IAB      Ectopic      Multiple      Live Births  2            Home Medications     Prior to Admission medications   Medication Sig Start Date End Date Taking? Authorizing Provider  cephALEXin (KEFLEX) 500 MG capsule Take 1 capsule (500 mg total) by mouth 2 (two) times daily for 5 days. 11/09/22 11/14/22 Yes Romano Stigger R, NP  ondansetron (ZOFRAN-ODT) 4 MG disintegrating tablet Take 1 tablet (4 mg total) by mouth every 8 (eight) hours as needed for nausea or vomiting. 11/09/22  Yes Valinda Hoar, NP  amLODipine (NORVASC) 5 MG tablet Take 1 tablet (5 mg total) by mouth every morning. 08/26/22   Allegra Grana, FNP  citalopram (CELEXA) 20 MG tablet TAKE 1 TABLET(20 MG) BY MOUTH EVERY MORNING 11/09/22   Allegra Grana, FNP  Coenzyme Q-10 200 MG CAPS Take by mouth.    [provider]  conjugated estrogens (PREMARIN) vaginal cream 1 applicatorful vaginally 1-2 times per week. 05/01/22   Allegra Grana, FNP  losartan (COZAAR) 50 MG tablet Take 1 tablet (50 mg total) by mouth daily. 08/26/22   Allegra Grana, FNP  Multiple Vitamins-Minerals (MULTIVITAMIN WITH MINERALS) tablet Take 1 tablet by mouth daily.    [provider]  Omega-3 Fatty Acids (FISH OIL  PO) Take by mouth.    [provider]  pravastatin (PRAVACHOL) 20 MG tablet Take 1 tablet (20 mg total) by mouth daily. 10/26/22   Allegra Grana, FNP  terconazole (TERAZOL 3) 0.8 % vaginal cream Place 1 applicator vaginally at bedtime. For 3 days Patient not taking: Reported on 11/09/2022 05/06/21   Flinchum, Eula Fried, FNP    Family History Family History  Problem Relation Age of Onset   Alzheimer's disease Brother    Heart disease Sister    Cancer Sister        kidney   Cancer Mother        liver   Cancer Father        prostate    Social History Social History   Tobacco Use   Smoking status: Never   Smokeless tobacco: Never  Substance Use Topics   Alcohol use: No   Drug use: No     Allergies   Simvastatin   Review of Systems Review of Systems   Physical  Exam Triage Vital Signs ED Triage Vitals [11/09/22 1305]  Encounter Vitals Group     BP (!) 140/70     Systolic BP Percentile      Diastolic BP Percentile      Pulse Rate 64     Resp 16     Temp 98 F (36.7 C)     Temp Source Oral     SpO2 99 %     Weight      Height      Head Circumference      Peak Flow      Pain Score 0     Pain Loc      Pain Education      Exclude from Growth Chart    No data found.  Updated Vital Signs BP (!) 140/70 (BP Location: Left Arm)   Pulse 64   Temp 98 F (36.7 C) (Oral)   Resp 16   SpO2 99%   Visual Acuity Right Eye Distance:   Left Eye Distance:   Bilateral Distance:    Right Eye Near:   Left Eye Near:    Bilateral Near:     Physical Exam Constitutional:      Appearance: Normal appearance.  Eyes:     Extraocular Movements: Extraocular movements intact.  Pulmonary:     Effort: Pulmonary effort is normal.  Abdominal:     General: Abdomen is flat. Bowel sounds are increased. There is no distension.     Palpations: Abdomen is soft.     Tenderness: There is no abdominal tenderness. There is no guarding.  Genitourinary:    Comments: deferred Neurological:     Mental Status: She is alert. Mental status is at baseline.      UC Treatments / Results  Labs (all labs ordered are listed, but only abnormal results are displayed) Labs Reviewed  POCT URINALYSIS DIP (MANUAL ENTRY) - Abnormal; Notable for the following components:      Result Value   Clarity, UA cloudy (*)    Blood, UA trace-lysed (*)    Nitrite, UA Positive (*)    All other components within normal limits  URINE CULTURE    EKG   Radiology No results found.  Procedures Procedures (including critical care time)  Medications Ordered in UC Medications  ondansetron (ZOFRAN-ODT) disintegrating tablet 4 mg (has no administration in time range)    Initial Impression / Assessment and Plan / UC Course  I have  reviewed the triage vital signs and the nursing  notes.  Pertinent labs & imaging results that were available during my care of the patient were reviewed by me and considered in my medical decision making (see chart for details).  Acute cystitis without hematuria  Urinalysis showing nitrates, negative for leukocytes, sent for culture, discussed with son who is caregiver, as patient has had increased confusion we will provide antibiotic, cephalexin sent to pharmacy, no abdominal tenderness noted on exam, advised to monitor, Zofran given in office and prescribed Zofran ODT for home use, advised increase fluid intake and good hygiene measures, may follow-up with urgent care or primary doctor as needed, has upcoming PCP appointment in 2 weeks Final Clinical Impressions(s) / UC Diagnoses   Final diagnoses:  Acute cystitis without hematuria     Discharge Instructions      Your urinalysis shows nitrates which is a enzyme released from bacteria , your urine will be sent to the lab to determine exactly which bacteria is present, if any changes need to be made to your medications you will be notified  Begin use of cephalexin every morning and every evening for 5 days  May use Zofran every 8 hours as needed for nausea and vomiting, placed under tongue and allow to dissolve  Increase your fluid intake through use of water  As always practice good hygiene, wiping front to back and avoidance of scented vaginal products to prevent further irritation  If symptoms continue to persist after use of medication or recur please follow-up with urgent care or your primary doctor as needed    ED Prescriptions     Medication Sig Dispense Auth. Provider   cephALEXin (KEFLEX) 500 MG capsule Take 1 capsule (500 mg total) by mouth 2 (two) times daily for 5 days. 10 capsule Fredric Slabach R, NP   ondansetron (ZOFRAN-ODT) 4 MG disintegrating tablet Take 1 tablet (4 mg total) by mouth every 8 (eight) hours as needed for nausea or vomiting. 20 tablet Valinda Hoar, NP      PDMP not reviewed this encounter.   Valinda Hoar, NP 11/09/22 1330

## 2022-11-09 NOTE — Telephone Encounter (Signed)
Noted I will reach out to check on pt!

## 2022-11-10 NOTE — Telephone Encounter (Signed)
noted 

## 2022-11-10 NOTE — Telephone Encounter (Signed)
Spoke to [place] pt son and he stated that Mom was much better this morning after getting her rest last night and taking her medicine for UTI.

## 2022-11-11 LAB — URINE CULTURE: Culture: >=100000 — AB

## 2022-11-11 NOTE — Telephone Encounter (Signed)
Pt son would like to be called regarding the pt appointments

## 2022-11-11 NOTE — Telephone Encounter (Signed)
Patient's son, [name], called to follow-up on earlier call.  Patient states he would like for [name], CMA, to please call him.  [name] states he just needs five minutes to speak with her.

## 2022-11-11 NOTE — Telephone Encounter (Signed)
Please call patient's son, [name] back. He has information for [employer].

## 2022-11-11 NOTE — Telephone Encounter (Signed)
Spoke to pt son [name] and he was inquiring about home health needs to assist with Mom, sent email to [facility] to see if they are able to help with this situation

## 2022-11-12 NOTE — Telephone Encounter (Signed)
Spoke to pt son [name] and he would like to know if he can with Mom upcoming appt to a virtual visit and also if we can put in orders for Home Health. I informed him that I had reached out to [facility] to se if they could assist as well.

## 2022-11-16 ENCOUNTER — Other Ambulatory Visit: Payer: Self-pay | Admitting: Family

## 2022-11-16 DIAGNOSIS — I10 Essential (primary) hypertension: Secondary | ICD-10-CM

## 2022-11-16 DIAGNOSIS — F0280 Dementia in other diseases classified elsewhere without behavioral disturbance: Secondary | ICD-10-CM

## 2022-11-17 ENCOUNTER — Telehealth: Payer: Self-pay | Admitting: *Deleted

## 2022-11-17 NOTE — Progress Notes (Signed)
Care Coordination   Note   11/17/2022 Name: Katherine Olson MRN: [id number] DOB: 01-24-1927  Katherine Olson is a 89 y.o. year old female who sees Arnett, Lyn Records, FNP for primary care. I reached out to Katherine Olson by phone today to offer care coordination services.  [name] was given information about Care Coordination services today including:   The Care Coordination services include support from the care team which includes your Nurse Coordinator, Clinical Social Worker, or Pharmacist.  The Care Coordination team is here to help remove barriers to the health concerns and goals most important to you. Care Coordination services are voluntary, and the patient may decline or stop services at any time by request to their care team member.   Care Coordination Consent Status: Patient agreed to services and verbal consent obtained.   Follow up plan:  Telephone appointment with care coordination team member scheduled for:  11/20/2022  Encounter Outcome:  Patient Scheduled from referral   Burman Nieves, [facility] Care Coordination Care Guide Direct Dial: [phone]

## 2022-11-17 NOTE — Addendum Note (Signed)
Addended by: [place], Ravleen Ries on: 11/17/2022 09:08 AM   Modules accepted: Orders

## 2022-11-17 NOTE — Telephone Encounter (Signed)
Spoke to pt son went over note below and he is coming to pick up cup for mom to give urine sample, orders placed

## 2022-11-19 ENCOUNTER — Other Ambulatory Visit (INDEPENDENT_AMBULATORY_CARE_PROVIDER_SITE_OTHER): Payer: Medicare HMO

## 2022-11-19 ENCOUNTER — Telehealth: Payer: Self-pay | Admitting: Family

## 2022-11-19 ENCOUNTER — Encounter: Payer: Self-pay | Admitting: Family

## 2022-11-19 DIAGNOSIS — R319 Hematuria, unspecified: Secondary | ICD-10-CM | POA: Diagnosis not present

## 2022-11-19 DIAGNOSIS — R300 Dysuria: Secondary | ICD-10-CM

## 2022-11-19 LAB — URINALYSIS, ROUTINE W REFLEX MICROSCOPIC
Bilirubin Urine: NEGATIVE
Ketones, ur: NEGATIVE
Nitrite: NEGATIVE
Specific Gravity, Urine: 1.01 (ref 1.000–1.030)
Total Protein, Urine: NEGATIVE
Urine Glucose: NEGATIVE
Urobilinogen, UA: 0.2 (ref 0.0–1.0)
pH: 7 (ref 5.0–8.0)

## 2022-11-19 NOTE — Telephone Encounter (Signed)
The patient's son called stating the patient is a 89 yr old woman that has dementia and her walking has gone next to nothing. He stated the patient threw up in the car while he was bringing her to her last office visit. He stated he brought his mother with him thinking she would be seen. However, the provider did not have any available appointments. He stated he was directed to take his mother to urgent care across the street. She was evaluated and determined to have a urinary tract infection. He informed me that his mother has an appointment with Arnett on Monday 9/23.24. He asked if the cup he received from this office could be used to bring another urine specimen. I informed him that he could. The order is in the system.

## 2022-11-20 ENCOUNTER — Ambulatory Visit: Payer: Self-pay | Admitting: *Deleted

## 2022-11-20 NOTE — Patient Instructions (Addendum)
Visit Information  Thank you for taking time to visit with me today. Please don't hesitate to contact me if I can be of assistance to you.   Following are the goals we discussed today:  Referral to be completed to [facility] for in home medical care Patient's son to continue to follow up with patient's provider regarding any concerns regarding patient's medical care.   Our next appointment is by telephone on 12/04/22 at 10am  Please call the care guide team at [phone] if you need to cancel or reschedule your appointment.   If you are experiencing a Mental Health or Behavioral Health Crisis or need someone to talk to, please call 911   Patient verbalizes understanding of instructions and care plan provided today and agrees to view in MyChart. Active MyChart status and patient understanding of how to access instructions and care plan via MyChart confirmed with patient.     Telephone follow up appointment with care management team member scheduled for:  12/04/22  Verna Czech, LCSW [place]  [facility], [facility] Licensed Clinical Social Worker Care Coordinator  Direct Dial: [phone]

## 2022-11-20 NOTE — Telephone Encounter (Signed)
Patient's son just called concerning about his mom. He wants too see if he can get some medication for his mom for her UTI. Could someone call him today. He said his mom has had the UTI for 4 days and she is in pain. His number is [phone].

## 2022-11-20 NOTE — Telephone Encounter (Signed)
Pt son is calling about the urine specimen and want to know if it came back yet because his mom need an antibiotic. Pt stated he was told to go to urgent care across the street for the pt to be seen. Pt son stated the NP from urgent care called him on her day off to let the pt son know about results. Pt stated that was a shame that urgent care called before the pt own provider called. Pt son want the provider to review the results from urgent care so the pt can get medication today

## 2022-11-20 NOTE — Telephone Encounter (Signed)
Called DPR back after speaking with Arnett NP and advised son since mother has no symptoms per son we will wait and treat if culture comes back positive for bacteria. That the abnormal results could be outside bacteria and not infection and best to wait. When first speaking with patient in Initial phone call he advised me he had called someone in the office " a little black girl" and they  had discontinued the conversation upon doing this he threatened to come to the office to give a piece of his mind. I advised the DPR this was totally inappropriate and he could not talk to staff this way, he then began to accuse of lying, and that  he would be taking his mother else where for care once we have culture.back .I have ask front office to start dismissal process due to [facility]. That patient son cannot threaten anyone in this office.

## 2022-11-20 NOTE — Telephone Encounter (Signed)
Spoke to pt informing him awaiting culture. Pt son continued to ask the same questions in regards to dip stick that was preformed, informed son due to [place] not knowing if this was a clean catch or not a urine culture was sent off to better determine the treatment plan. Son became very rude over the phone and stated "your just a little black girl" informed son I would not allow him to speak to me that way and to have a wonderful day, he then stated that " that is alright I will come up there and give you a piece of my mind.' I proceed to place the phone on mute and inform Claris Che.

## 2022-11-20 NOTE — Patient Outreach (Addendum)
Care Coordination   Initial Visit Note   11/20/2022 Name: ARDEAN POLHAMUS MRN: [id number] DOB: 20-Sep-1926  [name] is a 89 y.o. year old female who sees Arnett, Lyn Records, FNP for primary care. I spoke with  Sharyn Lull Lacey's son by phone today.  What matters to the patients health and wellness today?  Patient's son would like to consider in home medical care options due to challenges with transport for in office medical appointments-increased agitation and confusion during last transport. Per patient's son, patient more comfortable in home space. Patient's son agreeable to referral to [facility]_  Goals Addressed             This Visit's Progress    in home care options       Interventions Today    Flowsheet Row Most Recent Value  Chronic Disease   Chronic disease during today's visit Other  [Alzheimer's]  General Interventions   General Interventions Discussed/Reviewed General Interventions Discussed, [employer] of patient's medical condition discussed -explored options for in home medical care due to patient's current care needs -patient's son agreeable to [facility]-]  Safety Interventions   Safety Discussed/Reviewed Safety Discussed              SDOH assessments and interventions completed:  Yes  SDOH Interventions Today    Flowsheet Row Most Recent Value  SDOH Interventions   Food Insecurity Interventions Intervention Not Indicated  Housing Interventions Intervention Not Indicated  Transportation Interventions Intervention Not Indicated  Social Connections Interventions Intervention Not Indicated        Care Coordination Interventions:  Yes, provided   Follow up plan: Follow up call scheduled for 12/04/22    Encounter Outcome:  Patient Visit Completed

## 2022-11-21 ENCOUNTER — Encounter: Payer: Self-pay | Admitting: Family

## 2022-11-21 LAB — URINE CULTURE
MICRO NUMBER:: 15489548
SPECIMEN QUALITY:: ADEQUATE

## 2022-11-23 ENCOUNTER — Encounter: Payer: Medicare HMO | Admitting: Family

## 2022-11-23 ENCOUNTER — Telehealth: Payer: Self-pay | Admitting: Family

## 2022-11-23 NOTE — Telephone Encounter (Signed)
Patient's son

## 2022-11-23 NOTE — Telephone Encounter (Signed)
Patient's son just called. He wanted to let you know he got the antibiotics for his mom. She is doing great. He wanted to thank you

## 2022-11-24 NOTE — Addendum Note (Signed)
Addended by: [place], Naheim Burgen on: 11/24/2022 03:51 PM   Modules accepted: Orders

## 2022-11-26 NOTE — Progress Notes (Signed)
This encounter was created in error - please disregard.

## 2022-11-30 ENCOUNTER — Telehealth: Payer: Self-pay | Admitting: *Deleted

## 2022-12-01 ENCOUNTER — Telehealth: Payer: Self-pay

## 2022-12-01 NOTE — Telephone Encounter (Signed)
Spoke to [place] pt son and answered questions he had

## 2022-12-01 NOTE — Telephone Encounter (Signed)
Patient's son, Katherine Olson, would like for Katherine Olson, CMA, to please call him when she has a minute.  Katherine Olson states it won't take long, he just has a question about patient's antibiotic.

## 2022-12-01 NOTE — Patient Outreach (Signed)
Care Coordination   Follow Up Visit Note   12/01/2022 Name: ENDA TO MRN: [id number] DOB: 10/23/1926  [name] is a 89 y.o. year old female who sees Arnett, Lyn Records, FNP for primary care. I spoke with  Sharyn Lull Funches's son by phone today.  What matters to the patients health and wellness today?  Assistance with care.  CSW confirmed that patient is not in the area to receive in home medical care from [facility].  CSW to review alternative options with patient's son.   Goals Addressed             This Visit's Progress    in home care options       Interventions Today    Flowsheet Row Most Recent Value  Chronic Disease   Chronic disease during today's visit Other  [alzheimer's]  General Interventions   General Interventions Discussed/Reviewed General Interventions Reviewed, [employer], Communication with  [patient's son continues to provide patient with daily care, however remains interested in in-home care options]  Communication with --  [confirmed that referral to [facility] is not available as patient [place]-]              SDOH assessments and interventions completed:  No     Care Coordination Interventions:  Yes, provided   Follow up plan: Follow up call scheduled for 12/04/22    Encounter Outcome:  Patient Visit Completed

## 2022-12-01 NOTE — Patient Instructions (Signed)
Visit Information  Thank you for taking time to visit with me today. Please don't hesitate to contact me if I can be of assistance to you.   Following are the goals we discussed today:  CSW to follow up with options for in home care for patient. Patient's son to contact patient's provider with any questions regarding patient's medical care   Our next appointment is by telephone on 12/04/22 at 10am  Please call the care guide team at [phone] if you need to cancel or reschedule your appointment.   If you are experiencing a Mental Health or Behavioral Health Crisis or need someone to talk to, please call 911   Patient verbalizes understanding of instructions and care plan provided today and agrees to view in MyChart. Active MyChart status and patient understanding of how to access instructions and care plan via MyChart confirmed with patient.     Telephone follow up appointment with care management team member scheduled for: 12/04/22  Verna Czech, LCSW [place]  [facility], [facility] Licensed Clinical Social Worker Care Coordinator  Direct Dial: [phone]

## 2022-12-04 ENCOUNTER — Emergency Department
Admission: EM | Admit: 2022-12-04 | Discharge: 2022-12-04 | Disposition: A | Discharge status: Home / Self Care | Payer: Medicare HMO | Attending: Emergency Medicine | Admitting: Emergency Medicine

## 2022-12-04 ENCOUNTER — Ambulatory Visit: Payer: Self-pay | Admitting: *Deleted

## 2022-12-04 ENCOUNTER — Other Ambulatory Visit: Payer: Self-pay

## 2022-12-04 ENCOUNTER — Emergency Department: Payer: Medicare HMO

## 2022-12-04 DIAGNOSIS — F0390 Unspecified dementia without behavioral disturbance: Secondary | ICD-10-CM | POA: Insufficient documentation

## 2022-12-04 DIAGNOSIS — W19XXXA Unspecified fall, initial encounter: Secondary | ICD-10-CM | POA: Diagnosis not present

## 2022-12-04 DIAGNOSIS — S0003XA Contusion of scalp, initial encounter: Secondary | ICD-10-CM | POA: Diagnosis not present

## 2022-12-04 DIAGNOSIS — R9089 Other abnormal findings on diagnostic imaging of central nervous system: Secondary | ICD-10-CM | POA: Diagnosis not present

## 2022-12-04 DIAGNOSIS — S0990XA Unspecified injury of head, initial encounter: Secondary | ICD-10-CM | POA: Diagnosis not present

## 2022-12-04 DIAGNOSIS — W01198A Fall on same level from slipping, tripping and stumbling with subsequent striking against other object, initial encounter: Secondary | ICD-10-CM | POA: Insufficient documentation

## 2022-12-04 DIAGNOSIS — I10 Essential (primary) hypertension: Secondary | ICD-10-CM | POA: Insufficient documentation

## 2022-12-04 LAB — BASIC METABOLIC PANEL
Anion gap: 7 (ref 5–15)
BUN: 16 mg/dL (ref 8–23)
CO2: 23 mmol/L (ref 22–32)
Calcium: 9.5 mg/dL (ref 8.9–10.3)
Chloride: 107 mmol/L (ref 98–111)
Creatinine, Ser: 0.98 mg/dL (ref 0.44–1.00)
GFR, Estimated: 53 mL/min — ABNORMAL LOW (ref >60)
Glucose, Bld: 93 mg/dL (ref 70–99)
Potassium: 3.7 mmol/L (ref 3.5–5.1)
Sodium: 137 mmol/L (ref 135–145)

## 2022-12-04 LAB — CBC
HCT: 37.8 % (ref 36.0–46.0)
Hemoglobin: 13.2 g/dL (ref 12.0–15.0)
MCH: 30.5 pg (ref 26.0–34.0)
MCHC: 34.9 g/dL (ref 30.0–36.0)
MCV: 87.3 fL (ref 80.0–100.0)
Platelets: 219 10*3/uL (ref 150–400)
RBC: 4.33 MIL/uL (ref 3.87–5.11)
RDW: 12.6 % (ref 11.5–15.5)
WBC: 7 10*3/uL (ref 4.0–10.5)
nRBC: 0 % (ref 0.0–0.2)

## 2022-12-04 NOTE — ED Notes (Signed)
Dr. Marisa Severin spoke with the patient and her son regarding her discharge. Pts son reports they have to go. Dr. Marisa Severin discharged the patient but the patient and her son did not want to wait on any discharge paperwork. Pt wheeled out of the ED via her son and they verbalized understanding of follow up care.

## 2022-12-04 NOTE — Patient Outreach (Signed)
Care Coordination   Follow Up Visit Note   12/04/2022 Name: ALAYSHIA LANCE MRN: [id number] DOB: 06-Jul-1926  [name] is a 89 y.o. year old female who sees Arnett, Lyn Records, FNP for primary care. I spoke with  Sharyn Lull Scholze's son by phone today.  What matters to the patients health and wellness today?  In home medical care due to increased agitation when leaving her home for medical appointments. Patient had a fall today and hit her head. Patient is now in the ED.    Goals Addressed             This Visit's Progress    in home care options       Interventions Today    Flowsheet Row Most Recent Value  Chronic Disease   Chronic disease during today's visit Other  [Alzheimer's]  General Interventions   General Interventions Discussed/Reviewed General Interventions Reviewed, [employer], Communication with  [patient's son continues to be interested in in-home medical care for patient.]  Communication with --  [physicians making house calls-they are outside of the coverage area-[facility] is a possibility but will need to confirm that they are in network-son will call to confirm [phone]]  Safety Interventions   Safety Discussed/Reviewed Fall Risk  [patient's son confirms that patient fell today while making bed, hit her head and noticed swelling, ambulance called patient now in the ED]              SDOH assessments and interventions completed:  No     Care Coordination Interventions:  Yes, provided   Follow up plan: Follow up call scheduled for 12/10/22    Encounter Outcome:  Patient Visit Completed

## 2022-12-04 NOTE — Patient Instructions (Addendum)
Visit Information  Thank you for taking time to visit with me today. Please don't hesitate to contact me if I can be of assistance to you.   Following are the goals we discussed today:  Please follow up with [facility] [phone] regarding possibility of in home care Please follow up with patient's provider regarding any questions or concerns regarding patient's medical condition   Our next appointment is by telephone on 12/10/22 at 11am  Please call the care guide team at [phone] if you need to cancel or reschedule your appointment.   If you are experiencing a Mental Health or Behavioral Health Crisis or need someone to talk to, please call 911   Patient verbalizes understanding of instructions and care plan provided today and agrees to view in MyChart. Active MyChart status and patient understanding of how to access instructions and care plan via MyChart confirmed with patient.     Telephone follow up appointment with care management team member scheduled for: 12/10/22  Verna Czech, LCSW [place]  [facility], [facility] Licensed Clinical Social Worker Care Coordinator  Direct Dial: [phone]

## 2022-12-04 NOTE — ED Provider Triage Note (Signed)
Emergency Medicine Provider Triage Evaluation Note  Katherine Olson , a 89 y.o. female  was evaluated in triage.  Pt complains of fall and hit her head on the dresser. No LOC.  Review of Systems  Positive: headache Negative: Blurry vision, n/v  Physical Exam  BP (!) 170/80 (BP Location: Right Arm)   Pulse 72   Temp 97.9 F (36.6 C) (Oral)   Resp 20   SpO2 100%  Gen:   Awake, no distress   Resp:  Normal effort  MSK:   Moves extremities without difficulty  Other:  Hematoma to the left forehead  Medical Decision Making  Medically screening exam initiated at 3:24 PM.  Appropriate orders placed.  Katherine Olson was informed that the remainder of the evaluation will be completed by another provider, this initial triage assessment does not replace that evaluation, and the importance of remaining in the ED until their evaluation is complete.     Cameron Ali, PA-C 12/04/22 1526

## 2022-12-04 NOTE — ED Provider Notes (Signed)
[facility] Provider Note    Event Date/Time   First MD Initiated Contact with Patient 12/04/22 1806     (approximate)   History   Fall   HPI  Katherine Olson is a 89 y.o. female with a history of hypertension, hyperlipidemia, and dementia as well as recent UTI who presents with a fall.  Per the family members, the patient was witnessed tripping and falling while arranging the bed.  She hit her head on a dresser but did not lose consciousness.  The patient denies any headache or acute pain at this time.  She has no other injuries.  The family members are also concerned about the patient's kidney function.  They state that she was recently treated for UTI with an antibiotic which was subsequently switched to a different antibiotic.  She seems slightly altered when that urinary tract infection was diagnosed.  She is back to her baseline currently.  I reviewed the past medical records.  The patient's most recent outpatient progress note was with care management on 9/17 for care coordination.  She was seen in urgent care on 9/9 for confusion at which time she was initially diagnosed with a UTI and started on Keflex.   Physical Exam   Triage Vital Signs: ED Triage Vitals  Encounter Vitals Group     BP 12/04/22 1519 (!) 170/80     Systolic BP Percentile --      Diastolic BP Percentile --      Pulse Rate 12/04/22 1519 72     Resp 12/04/22 1519 20     Temp 12/04/22 1519 97.9 F (36.6 C)     Temp Source 12/04/22 1519 Oral     SpO2 12/04/22 1519 100 %     Weight 12/04/22 1527 158 lb (71.7 kg)     Height 12/04/22 1527 5\' 6"  (1.676 m)     Head Circumference --      Peak Flow --      Pain Score --      Pain Loc --      Pain Education --      Exclude from Growth Chart --     Most recent vital signs: Vitals:   12/04/22 1519  BP: (!) 170/80  Pulse: 72  Resp: 20  Temp: 97.9 F (36.6 C)  SpO2: 100%     General: Alert, oriented x 2, well-appearing for  age, no distress.  CV:  Good peripheral perfusion.  Resp:  Normal effort.  Abd:  No distention.  Other:  EOMI.  PERRLA.  Normal speech.  No facial droop.  Motor intact in all extremities.  Normal coordination.   ED Results / Procedures / Treatments   Labs (all labs ordered are listed, but only abnormal results are displayed) Labs Reviewed  BASIC METABOLIC PANEL - Abnormal; Notable for the following components:      Result Value   GFR, Estimated 53 (*)    All other components within normal limits  CBC  URINALYSIS, ROUTINE W REFLEX MICROSCOPIC     EKG     RADIOLOGY  CT head/cervical spine: I independently viewed and interpreted the images; there is no ICH.  Radiology report indicates no acute abnormality.  IMPRESSION:  1. No acute intracranial abnormality. No calvarial fracture. Small  left frontal scalp hematoma.  2. Stable senescent change.  3. No acute fracture or listhesis of the cervical spine.  4. Multilevel degenerative disc and degenerative joint disease  resulting in multilevel  neuroforaminal narrowing, most severe  bilaterally at C3-4 and C6-7.     PROCEDURES:  Critical Care performed: No  Procedures   MEDICATIONS ORDERED IN ED: Medications - No data to display   IMPRESSION / MDM / ASSESSMENT AND PLAN / ED COURSE  I reviewed the triage vital signs and the nursing notes.  89 year old female with PMH as noted above presents after a mechanical fall this evening, hitting her head on her dresser.  She had no LOC and denies any acute complaints at this time.  The family is also concerned about her kidney function since she has been recently treated for UTI.  Altered mental status that came at the onset of the UTI has now resolved, and they state that she is currently at her baseline.  Differential diagnosis includes, but is not limited to, mechanical fall, minor head injury, concussion, ICH.  Patient's presentation is most consistent with acute complicated  illness / injury requiring diagnostic workup.  CT head and cervical spine are negative for acute findings.  Lab workup is unremarkable.  The GFR is 53.  There is no leukocytosis.  The son had wanted initially to get a urinalysis to verify that the UTI had cleared up, however the patient was not immediately able to urinate.  The patient and family would now like to go home.  Since the patient has no other signs or symptoms of a UTI and a normal white count I think that this is reasonable.  I counseled them on the results of the workup.  I gave strict return precautions and they expressed understanding.  They declined to wait for physical printed discharge paperwork.   FINAL CLINICAL IMPRESSION(S) / ED DIAGNOSES   Final diagnoses:  Fall, initial encounter  Minor head injury, initial encounter     Rx / DC Orders   ED Discharge Orders     None        Note:  This document was prepared using Dragon voice recognition software and may include unintentional dictation errors.    Dionne Bucy, MD 12/04/22 [id number]

## 2022-12-04 NOTE — ED Triage Notes (Signed)
Ems report - pt from home s/p mechanical fall today. Pt hit head on dresser. Pt hx dementia

## 2022-12-04 NOTE — ED Triage Notes (Signed)
Pt comes with c/o fall. Pt fell striking her head on the dresser. Pt does have hx of dementia. Pt also recently dx with UTI and family wants her kidneys checked. Pt has large hematoma to left forehead.   Pt not on thinners. Pt did not loc

## 2022-12-07 ENCOUNTER — Telehealth: Payer: Self-pay | Admitting: *Deleted

## 2022-12-07 NOTE — Patient Outreach (Signed)
Care Coordination   Follow Up Visit Note   12/08/2022 Name: JADIAH BARNABA MRN: [id number] DOB: Aug 27, 1926  [name] is a 89 y.o. year old female who sees Arnett, Lyn Records, FNP for primary care. I spoke with  [name] by phone today.  What matters to the patients health and wellness today?  Referral to [facility]    Goals Addressed             This Visit's Progress    in home care options       Interventions Today    Flowsheet Row Most Recent Value  Chronic Disease   Chronic disease during today's visit Other  [Alzheimer's]  General Interventions   General Interventions Discussed/Reviewed General Interventions Reviewed, [employer], Communication with, Doctor Visits  Doctor Visits Discussed/Reviewed Doctor Visits Reviewed  [confirmed that due to patient's inceased agitation during transport to medica appts, patient's son requesting resources for in home medical care]  Communication with --  [confirmed son's concerns related to patient's medical follow up-discussed referral to [facility]]              SDOH assessments and interventions completed:  No     Care Coordination Interventions:  Yes, provided   Follow up plan: Follow up call scheduled for 12/11/22    Encounter Outcome:  Patient Visit Completed

## 2022-12-08 ENCOUNTER — Other Ambulatory Visit: Payer: Self-pay | Admitting: Family

## 2022-12-08 NOTE — Patient Instructions (Signed)
Visit Information  Thank you for taking time to visit with me today. Please don't hesitate to contact me if I can be of assistance to you.   Following are the goals we discussed today:  CSW to assist/follow up with referral to [facility] for in home medical care    Our next appointment is by telephone on 12/09/22 at 2pm  Please call the care guide team at [phone] if you need to cancel or reschedule your appointment.   If you are experiencing a Mental Health or Behavioral Health Crisis or need someone to talk to, please call 911   Patient verbalizes understanding of instructions and care plan provided today and agrees to view in MyChart. Active MyChart status and patient understanding of how to access instructions and care plan via MyChart confirmed with patient.     Telephone follow up appointment with care management team member scheduled for: 12/09/22  Verna Czech, LCSW [employer]  [facility], [facility] Health Licensed Clinical Social Worker Care Coordinator  Direct Dial: [phone]

## 2022-12-09 ENCOUNTER — Ambulatory Visit: Payer: Self-pay | Admitting: *Deleted

## 2022-12-09 NOTE — Patient Outreach (Signed)
Care Coordination   Follow Up Visit Note   12/09/2022 Name: KAYELYN MATTIA MRN: [id number] DOB: 1927-01-13  [name] is a 89 y.o. year old female who sees Arnett, Lyn Records, FNP for primary care. I spoke with  [name] by phone today.  What matters to the patients health and wellness today?  ***    Goals Addressed   None     SDOH assessments and interventions completed:  No{THN Tip this will not be part of the note when signed-REQUIRED REPORT FIELD DO NOT DELETE (Optional):27901}     Care Coordination Interventions:  Yes, provided {THN Tip this will not be part of the note when signed-REQUIRED REPORT FIELD DO NOT DELETE (Optional):27901}  Follow up plan: Follow up call scheduled for ***   Encounter Outcome:  Patient Visit Completed {THN Tip this will not be part of the note when signed-REQUIRED REPORT FIELD DO NOT DELETE (Optional):27901}

## 2022-12-10 ENCOUNTER — Encounter: Payer: Self-pay | Admitting: *Deleted

## 2022-12-10 NOTE — Patient Instructions (Signed)
Visit Information  Thank you for taking time to visit with me today. Please don't hesitate to contact me if I can be of assistance to you.   Following are the goals we discussed today:  Referral to respite care to be completed through [facility] In home care options to be provided to patient's son to assist with her care   Our next appointment is by telephone on 12/11/22 at 2pm  Please call the care guide team at [phone] if you need to cancel or reschedule your appointment.   If you are experiencing a Mental Health or Behavioral Health Crisis or need someone to talk to, please call 911   Patient verbalizes understanding of instructions and care plan provided today and agrees to view in MyChart. Active MyChart status and patient understanding of how to access instructions and care plan via MyChart confirmed with patient.     Telephone follow up appointment with care management team member scheduled for: 12/11/22 Verna Czech, LCSW [place]  [facility], [facility] Health Licensed Clinical Social Worker Care Coordinator  Direct Dial: [phone]

## 2022-12-11 ENCOUNTER — Ambulatory Visit: Payer: Self-pay | Admitting: *Deleted

## 2022-12-11 NOTE — Patient Outreach (Signed)
Care Coordination   Follow Up Visit Note   12/14/2022 Name: SARAHMAE MARIA MRN: [id number] DOB: 1926-05-18  [name] is a 89 y.o. year old female who sees Arnett, Lyn Records, FNP for primary care. I spoke with  [name] by phone today.  What matters to the patients health and wellness today?  Options for respite and in home medical care    Goals Addressed             This Visit's Progress    in home care options       Interventions Today    Flowsheet Row Most Recent Value  Chronic Disease   Chronic disease during today's visit Other  [alzheimers]  General Interventions   General Interventions Discussed/Reviewed General Interventions Reviewed, Community Resources, Level of Care  [continued to discuss options for in home medical care, discussed options for respite and in home aid care-referral to [facility] for respite completed]  Level of Care Personal Care Services  [confirmed that patient has a family member that is available to  provide care while son goes out to run errands, would like additional options for in home medical care-CSW to call the health dept to dicuss options for in home vaccinations ie flu shots]  Education Interventions   Education Provided Provided Education  Provided Verbal Education On [employer] that patient does not have in home medical care coverage however does have HH services, requested labs could be ordered through [facility]  if MD agreeable-orders would need to be authorized through pts insurance's  clinical intake team]              SDOH assessments and interventions completed:  No     Care Coordination Interventions:  Yes, provided   Follow up plan: Follow up call scheduled for 12/28/22    Encounter Outcome:  Patient Visit Completed

## 2022-12-14 NOTE — Patient Instructions (Signed)
Visit Information  Thank you for taking time to visit with me today. Please don't hesitate to contact me if I can be of assistance to you.   Following are the goals we discussed today:  CSW to follow up with referral for [facility] regarding referral for respite care CSW to follow up with the Health Department regarding in home care options   Our next appointment is by telephone on 12/28/22 at 11am  Please call the care guide team at [phone] if you need to cancel or reschedule your appointment.   If you are experiencing a Mental Health or Behavioral Health Crisis or need someone to talk to, please call 911   Patient verbalizes understanding of instructions and care plan provided today and agrees to view in MyChart. Active MyChart status and patient understanding of how to access instructions and care plan via MyChart confirmed with patient.     Telephone follow up appointment with care management team member scheduled for:12/28/22  Bishoy Cupp, LCSW [place]  [facility], [facility] Health Licensed Clinical Social Worker Care Coordinator  Direct Dial: [phone]

## 2022-12-16 ENCOUNTER — Encounter: Payer: Self-pay | Admitting: *Deleted

## 2022-12-16 NOTE — Patient Outreach (Signed)
Care Coordination   Collaboration  Visit Note   12/16/2022 Name: Katherine Olson MRN: [id number] DOB: 10-19-1926  Katherine Olson is a 89 y.o. year old female who sees Arnett, Lyn Records, FNP for primary care. I spoke with  the [facility] Department and [facility] What matters to the patients health and wellness today?  Respite care and at home flu vaccine    Goals Addressed             This Visit's Progress    community resource needs       Interventions Today    Flowsheet Row Most Recent Value  General Interventions   General Interventions Discussed/Reviewed Communication with  Communication with --  [[facility]-respite care referral completed-patient now on waiting list-Health Dept contacted confirmed Flu vaccinations for the home bound can be arranged-contact is Amy [phone] to call to schedule]              SDOH assessments and interventions completed:  No     Care Coordination Interventions:  Yes, provided   Follow up plan: Follow up call scheduled for 12/28/22    Encounter Outcome:  Patient Visit Completed

## 2022-12-23 DIAGNOSIS — R319 Hematuria, unspecified: Secondary | ICD-10-CM | POA: Diagnosis not present

## 2022-12-23 LAB — URINALYSIS, ROUTINE W REFLEX MICROSCOPIC
Bilirubin Urine: NEGATIVE
Hgb urine dipstick: NEGATIVE
Ketones, ur: NEGATIVE
Nitrite: NEGATIVE
Specific Gravity, Urine: 1.015 (ref 1.000–1.030)
Total Protein, Urine: NEGATIVE
Urine Glucose: NEGATIVE
Urobilinogen, UA: 0.2 (ref 0.0–1.0)
pH: 6 (ref 5.0–8.0)

## 2022-12-24 ENCOUNTER — Encounter: Payer: Self-pay | Admitting: Family

## 2022-12-24 ENCOUNTER — Telehealth: Payer: Self-pay | Admitting: Family

## 2022-12-24 NOTE — Telephone Encounter (Signed)
Patients son called and would like a call back regarding lab work results.

## 2022-12-25 NOTE — Telephone Encounter (Signed)
Noted! Waiting on lab results per [name]

## 2022-12-25 NOTE — Telephone Encounter (Signed)
Pt son [name] has spoken to [place] and are just waiting on Mom's  urine culture to be resulted

## 2022-12-26 LAB — URINE CULTURE
MICRO NUMBER:: 15632938
SPECIMEN QUALITY:: ADEQUATE

## 2022-12-28 ENCOUNTER — Ambulatory Visit: Payer: Self-pay | Admitting: *Deleted

## 2022-12-29 ENCOUNTER — Telehealth: Payer: Self-pay | Admitting: Family

## 2022-12-29 ENCOUNTER — Other Ambulatory Visit: Payer: Self-pay | Admitting: Family

## 2022-12-29 NOTE — Patient Instructions (Signed)
Visit Information  Thank you for taking time to visit with me today. Please don't hesitate to contact me if I can be of assistance to you.   Following are the goals we discussed today:   Please consider re-applying for Medicaid on patient's behalf Please contact your provider with any questions regarding your medical care   Our next appointment is by telephone on 01/12/23 at 11am  Please call the care guide team at [phone] if you need to cancel or reschedule your appointment.   If you are experiencing a Mental Health or Behavioral Health Crisis or need someone to talk to, please call 911   Patient verbalizes understanding of instructions and care plan provided today and agrees to view in MyChart. Active MyChart status and patient understanding of how to access instructions and care plan via MyChart confirmed with patient.     Telephone follow up appointment with care management team member scheduled for: 01/12/23  Verna Czech, LCSW [place]  [facility] Care Institute, [facility] Health Licensed Clinical Social Worker Care Coordinator  Direct Dial: [phone]

## 2022-12-29 NOTE — Patient Outreach (Signed)
Care Coordination   Follow Up Visit Note   12/29/2022 Name: Katherine Olson MRN: [id number] DOB: 18-Dec-1926  Katherine Olson is a 89 y.o. year old female who sees Arnett, Lyn Records, FNP for primary care. I spoke with  Katherine Olson by phone today.  What matters to the patients health and wellness today?  Options for respite and in home medical care    Goals Addressed             This Visit's Progress    in home care options       Interventions Today    Flowsheet Row Most Recent Value  Chronic Disease   Chronic disease during today's visit Other  [Alzheimer's diseaase]  General Interventions   General Interventions Discussed/Reviewed General Interventions Reviewed, Community Resources  [confimed that the health dept  will provide flu vaccinations for patient's that are homebound pt's son provided with contact info [phone] for scheduling-confirmed referral for respite care through [facility]-pt currently on waiting list]  Doctor Visits Discussed/Reviewed Doctor Visits Reviewed  [AWV scheduled for 05/03/23, follow up with PCP requested to be virtual, [facility] referral for labs to be dicussed with patient's provider]  Education Interventions   Education Provided Provided Education  Provided Verbal Education On [employer], Applications  [confirmed paid caregiver process- pt would need to be eligible for Medicaid-encouraged son to re-apply for medicaid on patient's behalf]              SDOH assessments and interventions completed:  No     Care Coordination Interventions:  Yes, provided   Follow up plan: Follow up call scheduled for 01/12/23    Encounter Outcome:  Patient Visit Completed

## 2022-12-29 NOTE — Telephone Encounter (Signed)
Spoke with dale States mom is eating well, drinking well.  She has denied any fever, flank pain.  Overall she is feeling well.  He does endorse that when she wipes after BM, he is seeing stool in her vagina.  He has been counseling her on how to properly clean herself.  She does wear a diaper due to urinary incontinence.  We discussed urine culture, urinalysis  Advised to start augmentin, probiotics Renally dosed, I called in augmentin 500mg  /125 BID x 10 days He understands to obtain UA and Urine culture in 3-4 weeks  He declines consult with urology at this time.  We will consider this if recurs

## 2022-12-30 ENCOUNTER — Other Ambulatory Visit: Payer: Self-pay

## 2022-12-30 DIAGNOSIS — R399 Unspecified symptoms and signs involving the genitourinary system: Secondary | ICD-10-CM

## 2023-01-12 ENCOUNTER — Telehealth: Payer: Self-pay | Admitting: *Deleted

## 2023-01-12 ENCOUNTER — Encounter: Payer: Self-pay | Admitting: Family

## 2023-01-12 ENCOUNTER — Ambulatory Visit: Payer: Self-pay | Admitting: *Deleted

## 2023-01-12 ENCOUNTER — Telehealth: Payer: Self-pay | Admitting: Family

## 2023-01-12 NOTE — Patient Outreach (Addendum)
  Care Coordination   Follow Up Visit Note   01/12/2023 Name: Katherine Olson MRN: [id number] DOB: 01/23/1927  Katherine Olson is a 89 y.o. year old female who sees Arnett, Lyn Records, FNP for primary care. I spoke with  Katherine Olson's son by phone today.  What matters to the patients health and wellness today?  Options for respite and in home medical care       Goals Addressed             This Visit's Progress    in home care options       Interventions Today    Flowsheet Row Most Recent Value  Chronic Disease   Chronic disease during today's visit Other  [Alzheimer's disease]  General Interventions   General Interventions Discussed/Reviewed General Interventions Reviewed, [employer], Doctor Visits, Vaccines, Communication with  Leveda Anna UTI-son has picked up speciman cup and will complete home test-son would like to confirm that [facility] nurse can complete blood draw when needed]  Vaccines Flu, RSV  [Nurse from the Health Dept will administer on 01/13/23 in the home RSV will be done separately]  Communication with --  [[place] [facility]-follow up call regarding respite care-confirmed that pt is now on waiting list 10-12 people ahead of her]              SDOH assessments and interventions completed:  No     Care Coordination Interventions:  Yes, provided   Follow up plan: Follow up call scheduled for 02/02/23    Encounter Outcome:  Patient Visit Completed

## 2023-01-12 NOTE — Telephone Encounter (Signed)
Urine cup  placed up front in cabinet  for pickup

## 2023-01-12 NOTE — Patient Outreach (Signed)
  Care Coordination   01/12/2023 Name: Katherine Olson MRN: [id number] DOB: June 03, 1926   Care Coordination Outreach Attempts:  An unsuccessful telephone outreach was attempted today to offer the patient information about available care coordination services.  Follow Up Plan:  Additional outreach attempts will be made to offer the patient care coordination information and services.   Encounter Outcome:  No Answer   Care Coordination Interventions:  No, not indicated    Tanaiya Kolarik, LCSW [place]  [facility], [facility] Health Licensed Clinical Social Worker Care Coordinator  Direct Dial: [phone]

## 2023-01-12 NOTE — Patient Instructions (Signed)
Visit Information  Thank you for taking time to visit with me today. Please don't hesitate to contact me if I can be of assistance to you.   Following are the goals we discussed today:  Health Dept will administer flu vaccine in home on 01/13/23 Patient remains on waiting list for resite care through [facility] CSW will follow up with patient's provider for lab draw if needed in the future   Our next appointment is by telephone on 02/02/23 at 10am  Please call the care guide team at [phone] if you need to cancel or reschedule your appointment.   If you are experiencing a Mental Health or Behavioral Health Crisis or need someone to talk to, please call 911   Patient verbalizes understanding of instructions and care plan provided today and agrees to view in MyChart. Active MyChart status and patient understanding of how to access instructions and care plan via MyChart confirmed with patient.     Telephone follow up appointment with care management team member scheduled for: 02/02/23  Verna Czech, LCSW [place]  [facility] Care Institute, [facility] Health Licensed Clinical Social Worker Care Coordinator  Direct Dial: [phone]

## 2023-01-12 NOTE — Telephone Encounter (Signed)
Patient's son called and said he will be here today around 12 pm to pick up urine cup. He wanted to make sure cup was up front for pick up.

## 2023-01-13 ENCOUNTER — Ambulatory Visit: Payer: Medicare HMO

## 2023-01-13 DIAGNOSIS — Z719 Counseling, unspecified: Secondary | ICD-10-CM

## 2023-01-13 DIAGNOSIS — Z23 Encounter for immunization: Secondary | ICD-10-CM

## 2023-01-13 NOTE — Progress Notes (Signed)
Homevisit made as requested by pt's son to administer flu vaccine.  VIS given and reviewed after vaccine care.  See vaccine administration/screening form signed by son (sent for scanning).   Fluzone high dose given IM by Marin Roberts, RN; tolerated well.   Updated [facility] copy provided.  Cherlynn Polo, RN

## 2023-01-18 ENCOUNTER — Telehealth: Payer: Self-pay

## 2023-01-18 DIAGNOSIS — N3941 Urge incontinence: Secondary | ICD-10-CM

## 2023-01-18 DIAGNOSIS — F0280 Dementia in other diseases classified elsewhere without behavioral disturbance: Secondary | ICD-10-CM

## 2023-01-18 NOTE — Telephone Encounter (Signed)
Spoke to [place] pt son and informed him that I will leave extra cup up front for him to pick up and also a hat as well

## 2023-01-18 NOTE — Telephone Encounter (Signed)
Patient's son, Alannah Cieslinski, called to state patient has been fussing, cursing, and won't pee in the cup.  [name] states patient isn't cooperating.  [name] states he was able to get a couple of drips, but he isn't sure this is enough.  Edythe Clarity states he may need another cup if this isn't enough.

## 2023-01-19 ENCOUNTER — Other Ambulatory Visit (INDEPENDENT_AMBULATORY_CARE_PROVIDER_SITE_OTHER): Payer: Medicare HMO

## 2023-01-19 DIAGNOSIS — R399 Unspecified symptoms and signs involving the genitourinary system: Secondary | ICD-10-CM | POA: Diagnosis not present

## 2023-01-19 LAB — URINALYSIS, ROUTINE W REFLEX MICROSCOPIC
Bilirubin Urine: NEGATIVE
Hgb urine dipstick: NEGATIVE
Ketones, ur: NEGATIVE
Nitrite: NEGATIVE
RBC / HPF: NONE SEEN (ref 0–?)
Specific Gravity, Urine: <=1.005 — AB (ref 1.000–1.030)
Total Protein, Urine: NEGATIVE
Urine Glucose: NEGATIVE
Urobilinogen, UA: 0.2 (ref 0.0–1.0)
pH: 6.5 (ref 5.0–8.0)

## 2023-01-19 NOTE — Telephone Encounter (Signed)
noted 

## 2023-01-19 NOTE — Telephone Encounter (Signed)
Katherine Olson Social worker called and ask could referral be put in for home health nurse for in and out cath to collect urine sample due to patient incontinent of bladder and son cannot get her to use urine cap and specimen cup. Also social worker will ask son to consider hospice referral .

## 2023-01-19 NOTE — Telephone Encounter (Signed)
FYI [facility] pool Please call son [name] and let him know that we placed a referral to home health.   Olegario Messier, do I need to cancel this order?

## 2023-01-19 NOTE — Telephone Encounter (Signed)
No do not cancel and [place] is letting him know and will discuss Palliative or hospice later to see if he is agreeable.

## 2023-01-20 ENCOUNTER — Encounter: Payer: Self-pay | Admitting: Family

## 2023-01-21 ENCOUNTER — Telehealth: Payer: Self-pay | Admitting: *Deleted

## 2023-01-21 NOTE — Patient Outreach (Signed)
  Care Coordination   01/21/2023 Name: Katherine Olson MRN: [id number] DOB: 1926-11-17   Care Coordination Outreach Attempts:  An unsuccessful telephone outreach was attempted today to offer the patient information about available care coordination services.  Follow Up Plan:  Additional outreach attempts will be made to offer the patient care coordination information and services.   Encounter Outcome:  No Answer   Care Coordination Interventions:  No, not indicated    Jahan Friedlander, LCSW [place]  [facility], [facility] Licensed Clinical Social Worker Care Coordinator  Direct Dial: [phone]

## 2023-01-22 ENCOUNTER — Telehealth: Payer: Self-pay | Admitting: *Deleted

## 2023-01-22 ENCOUNTER — Other Ambulatory Visit: Payer: Self-pay | Admitting: Family

## 2023-01-22 ENCOUNTER — Other Ambulatory Visit: Payer: Self-pay | Admitting: *Deleted

## 2023-01-22 ENCOUNTER — Encounter: Payer: Self-pay | Admitting: Family

## 2023-01-22 DIAGNOSIS — R300 Dysuria: Secondary | ICD-10-CM

## 2023-01-22 DIAGNOSIS — N390 Urinary tract infection, site not specified: Secondary | ICD-10-CM

## 2023-01-22 LAB — URINE CULTURE
MICRO NUMBER:: 15750562
SPECIMEN QUALITY:: ADEQUATE

## 2023-01-22 MED ORDER — SULFAMETHOXAZOLE-TRIMETHOPRIM 800-160 MG PO TABS
0.5000 | ORAL_TABLET | Freq: Every day | ORAL | 0 refills | Status: DC
Start: 2023-01-22 — End: 2023-04-01

## 2023-01-22 NOTE — Patient Instructions (Signed)
Visit Information  Thank you for taking time to visit with me today. Please don't hesitate to contact me if I can be of assistance to you.     Our next appointment is by telephone on 02/02/23 at 10am  Please call the care guide team at [phone] if you need to cancel or reschedule your appointment.   If you are experiencing a Mental Health or Behavioral Health Crisis or need someone to talk to, please call 911   Patient verbalizes understanding of instructions and care plan provided today and agrees to view in MyChart. Active MyChart status and patient understanding of how to access instructions and care plan via MyChart confirmed with patient.     Telephone follow up appointment with care management team member scheduled for: 02/02/23  Verna Czech, LCSW [place]  [facility], [facility] Licensed Clinical Social Worker Care Coordinator  Direct Dial: [phone]

## 2023-01-22 NOTE — Patient Outreach (Signed)
  Care Coordination   Follow Up Visit Note   01/22/2023 Name: Katherine Olson MRN: [id number] DOB: [birth year]  Katherine Olson is a 89 y.o. year old female who sees Arnett, Lyn Records, FNP for primary care. I spoke with  Katherine Olson's son by phone today.  What matters to the patients health and wellness today?  Assistance with in home care    Goals Addressed             This Visit's Progress    in home care options       Interventions Today    Flowsheet Row Most Recent Value  Chronic Disease   Chronic disease during today's visit Other  [alzheimer's disease]  General Interventions   General Interventions Discussed/Reviewed General Interventions Reviewed, Community Resources, Level of Care  [Patient's son report that he was able to collect a urine  specimen and provide it to providers office-continues to request a home health nurse to assist with patient's dailly care and to obtain blood work]  Level of Care Personal Care Services  [clarified home health services vs personal care-confirmed that personal care services would be an out of pocket expense, patient in waiting list for respite care through [facility] home health services provide skilled care only]  Education Interventions   Education Provided Provided Education  Provided Verbal Education On Other  [facility] services and what is offered as well as custodial  care and services offered under that service]              SDOH assessments and interventions completed:  No     Care Coordination Interventions:  Yes, provided   Follow up plan: Follow up call scheduled for 02/02/23    Encounter Outcome:  Patient Visit Completed

## 2023-01-25 ENCOUNTER — Encounter: Payer: Self-pay | Admitting: Family

## 2023-01-25 ENCOUNTER — Other Ambulatory Visit: Payer: Self-pay | Admitting: Family

## 2023-01-25 DIAGNOSIS — R944 Abnormal results of kidney function studies: Secondary | ICD-10-CM

## 2023-01-26 ENCOUNTER — Ambulatory Visit: Payer: Self-pay | Admitting: *Deleted

## 2023-01-26 ENCOUNTER — Telehealth: Payer: Self-pay | Admitting: Family

## 2023-01-26 NOTE — Telephone Encounter (Signed)
Patient's son called and wants to know if someone can call him. He said he is trying to get in contact with Crystal. His number is [phone].

## 2023-01-26 NOTE — Patient Outreach (Signed)
  Care Coordination   Incoming call/inquiry  Visit Note   01/26/2023 Name: Katherine Olson MRN: [id number] DOB: Jul 23, 1926  Katherine Olson is a 89 y.o. year old female who sees Arnett, Lyn Records, FNP for primary care. I  received call from pt's son, Katherine Olson, while covering for assigned CSW.  What matters to the patients health and wellness today?  Need to get someone to draw blood and take urine sample from home given pt is not able to get out home.       Care Coordination Interventions:  Yes, provided  Interventions Today    Flowsheet Row Most Recent Value  General Interventions   General Interventions Discussed/Reviewed Doctor Visits, Communication with  Vaccines --  [Per son,plans for pt to have a PCP virtual visit  on 02/03/23. He is wanting some in-home support with drawing blood, urine sample, etc and I have explained to him that [facility] will require a "face to face" with PCP for [facility] Orders to be placed. He understands]  Doctor Visits Discussed/Reviewed Doctor Visits Discussed  [Per son, pt is on Bactrim for a UTI-plans for PCP for virtual visit 02/03/23. Son wants to get help in the home,  including lab work- [facility] agency will have to get orders from Provider after the virtual visit- he understands and is ok with this plan.]  Communication with RN, Social Work  [employer] phone call from son with RNCM, George Ina and will brief assigned CSW upon return to work.]       Follow up plan:  Assigned CSW, [employer], to follow up upon her return to work next week.    Encounter Outcome:  Patient Visit Completed

## 2023-01-26 NOTE — Telephone Encounter (Signed)
Called and spoke to pt son he found the number. He wanted me to put to label urine cups, 2 hats, and some wipes up front for him to pick up for mom. They are placed up front ready for pick up for pt.

## 2023-01-27 ENCOUNTER — Ambulatory Visit: Payer: Self-pay

## 2023-01-27 ENCOUNTER — Other Ambulatory Visit: Payer: Medicare HMO

## 2023-01-27 NOTE — Patient Instructions (Signed)
Visit Information  Thank you for taking time to visit with me today. Please don't hesitate to contact me if I can be of assistance to you.   Following are the goals we discussed today:   Goals Addressed             This Visit's Progress    management of health conditions and community resource needs.       Interventions Today    Flowsheet Row Most Recent Value  Chronic Disease   Chronic disease during today's visit Other  [Dementia, UTI]  General Interventions   General Interventions Discussed/Reviewed General Interventions Reviewed, Doctor Visits, Labs  [place] of current treatment plan for Dementia/ UTI and patients adherence to plan as established by provider.  Assessed for UTI symptoms.]  Labs Kidney Function  [discussed son's concerns about patients kidney function and taking Bactrim.]  Doctor Visits Discussed/Reviewed Doctor Visits Reviewed  [name] upcoming provider visits.]  Education Interventions   Education Provided Provided Education  Provided Verbal Education On Other  [Discussed [facility] option for in home care.]  Pharmacy Interventions   Pharmacy Dicussed/Reviewed Pharmacy Topics Reviewed  Algis Downs to take antibiotic medication until completion.]              Our next appointment is by telephone on 02/04/23 at 11 am  Please call the care guide team at [phone] if you need to cancel or reschedule your appointment.   If you are experiencing a Mental Health or Behavioral Health Crisis or need someone to talk to, please call the Suicide and Crisis Lifeline: 988 call 1-800-273-TALK (toll free, 24 hour hotline)  Patient verbalizes understanding of instructions and care plan provided today and agrees to view in MyChart. Active MyChart status and patient understanding of how to access instructions and care plan via MyChart confirmed with patient.     George Ina RN,BSN,CCM [place]  [facility], [facility] coordinator  / Case Manager Phone: [phone]

## 2023-01-27 NOTE — Patient Outreach (Signed)
  Care Coordination   Follow Up Visit Note   01/27/2023 Name: Katherine Olson MRN: [id number] DOB: [birth year]  Katherine Olson is a 89 y.o. year old female who sees Katherine Olson, Katherine Records, FNP for primary care. I  spoke with son Katherine Olson  What matters to the patients health and wellness today?  Son states patient has dementia and a urinary tract infection.  He states patient has been on 2 prescriptions of antibiotic to clear up her UTI without success. He states patient is currently on Bactrim daily and supplements prescribed by her primary care provider.  Son states it was very difficult for him to try to catch a urine sample due to patients immobility and dementia states.  Son states he is concerned about patient kidney's due to taking the bactrim and would like for her to have labs drawn and another urine test.  Son states it has become very difficult to take patient out of her home/ familiar environment. He states this is very upsetting for her and only causes additional confusion.       Goals Addressed             This Visit's Progress    management of health conditions and community resource needs.       Interventions Today    Flowsheet Row Most Recent Value  Chronic Disease   Chronic disease during today's visit Other  [Dementia, UTI]  General Interventions   General Interventions Discussed/Reviewed General Interventions Reviewed, Doctor Visits, Labs  [place] of current treatment plan for Dementia/ UTI and patients adherence to plan as established by provider.  Assessed for UTI symptoms.]  Labs Kidney Function  [discussed son's concerns about patients kidney function and taking Bactrim.]  Doctor Visits Discussed/Reviewed Doctor Visits Reviewed  Katherine Olson upcoming provider visits.]  Education Interventions   Education Provided Provided Education  Provided Verbal Education On Other  [Discussed [facility] option for in home care.]  Pharmacy Interventions   Pharmacy  Dicussed/Reviewed Pharmacy Topics Reviewed  Katherine Olson to take antibiotic medication until completion.]              SDOH assessments and interventions completed:  No     Care Coordination Interventions:  Yes, provided   Follow up plan: Follow up call scheduled for 02/04/23    Encounter Outcome:  Patient Visit Completed  Katherine Ina RN,BSN,CCM [facility]  [facility] Institute, [facility] coordinator / Case Manager Phone: [phone]

## 2023-02-02 ENCOUNTER — Ambulatory Visit: Payer: Self-pay | Admitting: *Deleted

## 2023-02-03 ENCOUNTER — Telehealth: Payer: Medicare HMO | Admitting: Family

## 2023-02-03 ENCOUNTER — Encounter: Payer: Self-pay | Admitting: Family

## 2023-02-03 VITALS — BP 140/83 | HR 76 | Ht 66.0 in | Wt 160.0 lb

## 2023-02-03 DIAGNOSIS — N390 Urinary tract infection, site not specified: Secondary | ICD-10-CM

## 2023-02-03 DIAGNOSIS — R413 Other amnesia: Secondary | ICD-10-CM

## 2023-02-03 NOTE — Patient Instructions (Signed)
Referral to home health placed today. Please let me know how you are doing and of course of any further concerns.

## 2023-02-03 NOTE — Progress Notes (Signed)
Virtual Visit via Video Note  I connected with Katherine Olson on 02/03/23 at 10:30 AM EST by a video enabled telemedicine application and verified that I am speaking with the correct person using two identifiers. Location patient: home Location provider: work  Persons participating in the virtual visit: patient, provider  I discussed the limitations of evaluation and management by telemedicine and the availability of in person appointments. The patient expressed understanding and agreed to proceed.  HPI:  Visit today to discuss home health referral and need for assistance with ADLs Son is primary historian Katherine Olson describes that has become too difficult to get her into the car due to urinary incontinence, difficulty walking.    He has been in touch with Child psychotherapist, [employer].  Labs and Foley catheterization as needed. Caregiver would need reprive.  She can feed herself. She needs help once in the shower. She sits on the shower seat.   Fall 12/04/22.  Denies dysuria,  fever, hematuria, mental status changes.   Compliant with half tablet of bactrim daily with d mannose and cranberry supplement.    ROS: See pertinent positives and negatives per HPI.  EXAM:  VITALS per patient if applicable: BP (!) 140/83   Pulse 76   Ht 5\' 6"  (1.676 m)   Wt 160 lb (72.6 kg)   SpO2 98%   BMI 25.82 kg/m  BP Readings from Last 3 Encounters:  02/03/23 (!) 140/83  12/04/22 (!) 170/80  11/09/22 (!) 140/70   Wt Readings from Last 3 Encounters:  02/03/23 160 lb (72.6 kg)  12/04/22 158 lb (71.7 kg)  05/01/22 154 lb (69.9 kg)    GENERAL: alert, oriented, appears well and in no acute distress  HEENT: atraumatic, conjunttiva clear, no obvious abnormalities on inspection of external nose and ears  NECK: normal movements of the head and neck  LUNGS: on inspection no signs of respiratory distress, breathing rate appears normal, no obvious gross SOB, gasping or wheezing  CV: no obvious  cyanosis  MS: moves all visible extremities without noticeable abnormality  PSYCH/NEURO: pleasant and cooperative, no obvious depression or anxiety, speech and thought processing grossly intact  ASSESSMENT AND PLAN: Recurrent UTI Assessment & Plan: Asymptomatic.  Patient compliant with half tablet of Bactrim 3 months.  She will continue d-mannose, cranberry supplement.  Repeat urinalysis, BMP once home health is established.   Memory loss Assessment & Plan: Upon chart review, no formal evaluation for memory loss.  She has not been seen by neurology.   Appropriate to place home health referral in regards to obtaining as needed labs, serum, urine in Foley catheterization if needed.  Home health referral physical therapy appreciated to prevent falls and encourage strengthening for patient. Reprieve for son who is primary caregiver is also very appropriate.  Referral placed.  Appreciate recommendations from social worker [employer].       -we discussed possible serious and likely etiologies, options for evaluation and workup, limitations of telemedicine visit vs in person visit, treatment, treatment risks and precautions. Pt prefers to treat via telemedicine empirically rather then risking or undertaking an in person visit at this moment.    I discussed the assessment and treatment plan with the patient. The patient was provided an opportunity to ask questions and all were answered. The patient agreed with the plan and demonstrated an understanding of the instructions.   The patient was advised to call back or seek an in-person evaluation if the symptoms worsen or if the condition fails  to improve as anticipated.  Advised if desired AVS can be mailed or viewed via MyChart if Mychart user.   Rennie Plowman, FNP

## 2023-02-03 NOTE — Patient Outreach (Signed)
  Care Coordination   Follow Up Visit Note   02/03/2023 late entry Name: ALPHONSINE BIEL MRN: [id number] DOB: 04/18/1926  [name] is a 89 y.o. year old female who sees Arnett, Lyn Records, FNP for primary care. I spoke with  Sharyn Lull Kastelic's son [name] by phone on 02/02/23.  What matters to the patients health and wellness today?  Assistance with in home care.    Goals Addressed             This Visit's Progress    in home care options        Activities and task to complete in order to accomplish goals.   EMOTIONAL / MENTAL HEALTH SUPPORT Self Support options  (respite care referral completed to promote self care. Patient placed on waiting list, [facility] will contact patient's son for follow up) HH orders placed for PT and nursing, CSW follow up on referrals placed        SDOH assessments and interventions completed:  No     Care Coordination Interventions:  Yes, provided  Interventions Today    Flowsheet Row Most Recent Value  Chronic Disease   Chronic disease during today's visit Other  [Dementia, UTI]  General Interventions   General Interventions Discussed/Reviewed General Interventions Reviewed, Labs, Doctor Visits  [Pt's son requesting [facility] services for lab work,PT also ordered for strengthening]  Labs Kidney Function  [discussed concern re: frequent UTI's and medication prescribed affecting her kidney function agrees to discuss concerns during PCP appt on 02/03/23]  Doctor Visits Discussed/Reviewed Doctor Visits Discussed, PCP  [PCP video visit scheduled for 02/03/23]  Education Interventions   Education Provided Provided Education  Provided Verbal Education On Other  [facility] nursing and PT, confirmed that they do not cover personal care, personal care confirmed to be an out of pocket expense]  Mental Health Interventions   Mental Health Discussed/Reviewed Mental Health Reviewed  [caregiver stress continues to be acknowledged, emotional support  provided regarding caregiving responsibilities, self care emphasized, respite care referral completed]       Follow up plan: Follow up call scheduled for 02/16/23    Encounter Outcome:  Patient Visit Completed

## 2023-02-03 NOTE — Patient Instructions (Signed)
Visit Information  Thank you for taking time to visit with me today. Please don't hesitate to contact me if I can be of assistance to you.   Following are the goals we discussed today:   Goals Addressed             This Visit's Progress    in home care options        Activities and task to complete in order to accomplish goals.   EMOTIONAL / MENTAL HEALTH SUPPORT Self Support options  (respite care referral completed to promote self care. Patient placed on waiting list, [facility] will contact patient's son for follow up) HH orders placed for PT and nursing, CSW follow up on referrals placed        Our next appointment is by telephone on 02/16/23 at 11am  Please call the care guide team at [phone] if you need to cancel or reschedule your appointment.   If you are experiencing a Mental Health or Behavioral Health Crisis or need someone to talk to, please call 911   Patient verbalizes understanding of instructions and care plan provided today and agrees to view in MyChart. Active MyChart status and patient understanding of how to access instructions and care plan via MyChart confirmed with patient.     Telephone follow up appointment with care management team member scheduled for: 02/16/23  Verna Czech, LCSW [place]  [facility], [facility] Licensed Clinical Social Worker Care Coordinator  Direct Dial: [phone]

## 2023-02-03 NOTE — Assessment & Plan Note (Addendum)
Upon chart review, no formal evaluation for memory loss.  She has not been seen by neurology.   Appropriate to place home health referral in regards to obtaining as needed labs, serum, urine in Foley catheterization if needed.  Home health referral physical therapy appreciated to prevent falls and encourage strengthening for patient. Reprieve for son who is primary caregiver is also very appropriate.  Referral placed.  Appreciate recommendations from social worker [employer].

## 2023-02-03 NOTE — Assessment & Plan Note (Signed)
Asymptomatic.  Patient compliant with half tablet of Bactrim 3 months.  She will continue d-mannose, cranberry supplement.  Repeat urinalysis, BMP once home health is established.

## 2023-02-04 ENCOUNTER — Ambulatory Visit: Payer: Self-pay

## 2023-02-04 NOTE — Patient Outreach (Signed)
  Care Coordination   Follow Up Visit Note   02/04/2023 Name: Katherine Olson MRN: [id number] DOB: 05-12-1926  Katherine Olson is a 89 y.o. year old female who sees Arnett, Lyn Records, FNP for primary care. I spoke with [place], son/ designated party release by phone today.  What matters to the patients health and wellness today?  Son states patient had virtual follow up visit with her primary care provider on yesterday.  He states they spoke about patients nursing needs regarding labs and urine sample and also need for physical therapy.  Denies patient having any new symptoms or concerns at this time.     Goals Addressed             This Visit's Progress    management of health conditions and community resource needs.       Interventions Today    Flowsheet Row Most Recent Value  Chronic Disease   Chronic disease during today's visit Other  [Dementia, recurrent UTI]  General Interventions   General Interventions Discussed/Reviewed General Interventions Reviewed, Doctor Visits  [evaluation of current treatment plan for Dementia/ recurrent UTI and patients adherence to plan as established by provider. Assessed for any new/ ongoing symptoms.]  Doctor Visits Discussed/Reviewed Doctor Visits Reviewed  [name] upcoming provider visits. Discussed primary care provider visit from 02/03/24.  Confirmed with son that home health referral was discussed with provider.]  Communication with Social Work  Public house manager with social worker.]  Education Interventions   Education Provided Provided Education  [Advised son to contact  RNCM if he does not hear from the home health agency.]              SDOH assessments and interventions completed:  No     Care Coordination Interventions:  Yes, provided   Follow up plan: Follow up call scheduled for 02/16/23    Encounter Outcome:  Patient Visit Completed   George Ina RN,BSN,CCM [facility]  Value[facility]Based [facility] Institute, [facility] coordinator / Case Manager Phone: [phone]

## 2023-02-05 ENCOUNTER — Encounter: Payer: Self-pay | Admitting: Family

## 2023-02-05 ENCOUNTER — Other Ambulatory Visit: Payer: Self-pay | Admitting: Family

## 2023-02-05 DIAGNOSIS — F0280 Dementia in other diseases classified elsewhere without behavioral disturbance: Secondary | ICD-10-CM | POA: Diagnosis not present

## 2023-02-05 DIAGNOSIS — N390 Urinary tract infection, site not specified: Secondary | ICD-10-CM | POA: Diagnosis not present

## 2023-02-05 DIAGNOSIS — M6289 Other specified disorders of muscle: Secondary | ICD-10-CM | POA: Diagnosis not present

## 2023-02-05 DIAGNOSIS — G309 Alzheimer's disease, unspecified: Secondary | ICD-10-CM | POA: Diagnosis not present

## 2023-02-05 DIAGNOSIS — F419 Anxiety disorder, unspecified: Secondary | ICD-10-CM

## 2023-02-05 DIAGNOSIS — I10 Essential (primary) hypertension: Secondary | ICD-10-CM | POA: Diagnosis not present

## 2023-02-05 DIAGNOSIS — Z792 Long term (current) use of antibiotics: Secondary | ICD-10-CM | POA: Diagnosis not present

## 2023-02-05 NOTE — Patient Instructions (Signed)
Visit Information  Thank you for taking time to visit with me today. Please don't hesitate to contact me if I can be of assistance to you.   Following are the goals we discussed today:   Goals Addressed             This Visit's Progress    management of health conditions and community resource needs.       Interventions Today    Flowsheet Row Most Recent Value  Chronic Disease   Chronic disease during today's visit Other  [Dementia, recurrent UTI]  General Interventions   General Interventions Discussed/Reviewed General Interventions Reviewed, Doctor Visits  [evaluation of current treatment plan for Dementia/ recurrent UTI and patients adherence to plan as established by provider. Assessed for any new/ ongoing symptoms.]  Doctor Visits Discussed/Reviewed Doctor Visits Reviewed  [name] upcoming provider visits. Discussed primary care provider visit from 02/03/24.  Confirmed with son that home health referral was discussed with provider.]  Communication with Social Work  Public house manager with social worker.]  Education Interventions   Education Provided Provided Education  [Advised son to contact  RNCM if he does not hear from the home health agency.]              Our next appointment is by telephone on 02/16/23 at 11 am  Please call the care guide team at [phone] if you need to cancel or reschedule your appointment.   If you are experiencing a Mental Health or Behavioral Health Crisis or need someone to talk to, please call the Suicide and Crisis Lifeline: 988 call 1-800-273-TALK (toll free, 24 hour hotline)  Patient verbalizes understanding of instructions and care plan provided today and agrees to view in MyChart. Active MyChart status and patient understanding of how to access instructions and care plan via MyChart confirmed with patient.     George Ina RN,BSN,CCM [place]  Value[facility] Institute, [facility] coordinator / Case Manager Phone:  [phone]

## 2023-02-08 DIAGNOSIS — G309 Alzheimer's disease, unspecified: Secondary | ICD-10-CM | POA: Diagnosis not present

## 2023-02-08 DIAGNOSIS — Z792 Long term (current) use of antibiotics: Secondary | ICD-10-CM | POA: Diagnosis not present

## 2023-02-08 DIAGNOSIS — N390 Urinary tract infection, site not specified: Secondary | ICD-10-CM | POA: Diagnosis not present

## 2023-02-08 DIAGNOSIS — F0280 Dementia in other diseases classified elsewhere without behavioral disturbance: Secondary | ICD-10-CM | POA: Diagnosis not present

## 2023-02-08 DIAGNOSIS — I10 Essential (primary) hypertension: Secondary | ICD-10-CM | POA: Diagnosis not present

## 2023-02-08 DIAGNOSIS — M6289 Other specified disorders of muscle: Secondary | ICD-10-CM | POA: Diagnosis not present

## 2023-02-11 ENCOUNTER — Ambulatory Visit: Payer: Self-pay

## 2023-02-11 DIAGNOSIS — M6289 Other specified disorders of muscle: Secondary | ICD-10-CM | POA: Diagnosis not present

## 2023-02-11 DIAGNOSIS — G309 Alzheimer's disease, unspecified: Secondary | ICD-10-CM | POA: Diagnosis not present

## 2023-02-11 DIAGNOSIS — N390 Urinary tract infection, site not specified: Secondary | ICD-10-CM | POA: Diagnosis not present

## 2023-02-11 DIAGNOSIS — I10 Essential (primary) hypertension: Secondary | ICD-10-CM | POA: Diagnosis not present

## 2023-02-11 DIAGNOSIS — Z792 Long term (current) use of antibiotics: Secondary | ICD-10-CM | POA: Diagnosis not present

## 2023-02-11 DIAGNOSIS — F0280 Dementia in other diseases classified elsewhere without behavioral disturbance: Secondary | ICD-10-CM | POA: Diagnosis not present

## 2023-02-11 NOTE — Patient Outreach (Signed)
  Care Coordination   Follow Up Visit Note   02/11/2023 Name: KHILA YELLOCK MRN: [id number] DOB: [birth year]  [name] is a 89 y.o. year old female who sees Arnett, Lyn Records, FNP for primary care. I  spoke with [place] Neighbor, son/ designated party release.   What matters to the patients health and wellness today?  Son reports home health services have started for patient.  He reports patient is receiving PT/ Skilled nursing and home health aide services.  Son states home health nurse is scheduled to see patient today for lab draw.   Son denies patient having any new or ongoing symptoms.     Goals Addressed             This Visit's Progress    management of health conditions and community resource needs.       Interventions Today    Flowsheet Row Most Recent Value  Chronic Disease   Chronic disease during today's visit Other  [Dementia]  General Interventions   General Interventions Discussed/Reviewed General Interventions Reviewed, Doctor Visits, Level of Care  [evaluation of current treatment plan for dementia and patients adherence to plan as established by provider. Confirmed home health services started for patient.]  Doctor Visits Discussed/Reviewed Doctor Visits Reviewed              SDOH assessments and interventions completed:  No     Care Coordination Interventions:  Yes, provided   Follow up plan: Follow up call scheduled for 03/16/22    Encounter Outcome:  Patient Visit Completed   George Ina RN,BSN,CCM [facility]  [facility], [facility] coordinator / Case Manager Phone: [phone]

## 2023-02-11 NOTE — Patient Instructions (Signed)
Visit Information  Thank you for taking time to visit with me today. Please don't hesitate to contact me if I can be of assistance to you.   Following are the goals we discussed today:   Goals Addressed             This Visit's Progress    management of health conditions and community resource needs.       Interventions Today    Flowsheet Row Most Recent Value  Chronic Disease   Chronic disease during today's visit Other  [Dementia]  General Interventions   General Interventions Discussed/Reviewed General Interventions Reviewed, Doctor Visits, Level of Care  [evaluation of current treatment plan for dementia and patients adherence to plan as established by provider. Confirmed home health services started for patient.]  Doctor Visits Discussed/Reviewed Doctor Visits Reviewed              Our next appointment is by telephone on 03/16/22 at 11 am  Please call the care guide team at [phone] if you need to cancel or reschedule your appointment.   If you are experiencing a Mental Health or Behavioral Health Crisis or need someone to talk to, please call the Suicide and Crisis Lifeline: 988 call 1-800-273-TALK (toll free, 24 hour hotline)  Patient verbalizes understanding of instructions and care plan provided today and agrees to view in MyChart. Active MyChart status and patient understanding of how to access instructions and care plan via MyChart confirmed with patient.     George Ina RN,BSN,CCM [place]  [facility] Institute, [facility] coordinator / Case Manager Phone: [phone]

## 2023-02-12 ENCOUNTER — Ambulatory Visit: Payer: Self-pay | Admitting: *Deleted

## 2023-02-12 DIAGNOSIS — M6289 Other specified disorders of muscle: Secondary | ICD-10-CM | POA: Diagnosis not present

## 2023-02-12 DIAGNOSIS — N390 Urinary tract infection, site not specified: Secondary | ICD-10-CM | POA: Diagnosis not present

## 2023-02-12 DIAGNOSIS — Z792 Long term (current) use of antibiotics: Secondary | ICD-10-CM | POA: Diagnosis not present

## 2023-02-12 DIAGNOSIS — I10 Essential (primary) hypertension: Secondary | ICD-10-CM | POA: Diagnosis not present

## 2023-02-12 DIAGNOSIS — G309 Alzheimer's disease, unspecified: Secondary | ICD-10-CM | POA: Diagnosis not present

## 2023-02-12 DIAGNOSIS — F0280 Dementia in other diseases classified elsewhere without behavioral disturbance: Secondary | ICD-10-CM | POA: Diagnosis not present

## 2023-02-12 LAB — LAB REPORT - SCANNED: EGFR: 44

## 2023-02-12 NOTE — Patient Instructions (Signed)
Visit Information  Thank you for taking time to visit with me today. Please don't hesitate to contact me if I can be of assistance to you.   Following are the goals we discussed today:   Goals Addressed             This Visit's Progress    COMPLETED: community resource needs       Interventions Today    Flowsheet Row Most Recent Value  General Interventions   General Interventions Discussed/Reviewed Communication with  Communication with --  [[facility]-respite care referral completed-patient now on waiting list-Health Dept contacted confirmed Flu vaccinations for the home bound can be arranged-contact is Amy [phone] to call to schedule]           in home care options        Activities and task to complete in order to accomplish goals.   EMOTIONAL / MENTAL HEALTH SUPPORT Self Support options  (respite care referral completed to promote self care. Patient placed on waiting list, [facility] will contact patient's son for follow up) Continue to follow up with [facility] PT, OT bath aid and nursing Referral placed with [facility] for home based medical care program-please expect a phone call from the to schedule the initial asessment        Our next appointment is by telephone on 03/01/23 at 1pm  Please call the care guide team at [phone] if you need to cancel or reschedule your appointment.   If you are experiencing a Mental Health or Behavioral Health Crisis or need someone to talk to, please call 911   Patient verbalizes understanding of instructions and care plan provided today and agrees to view in MyChart. Active MyChart status and patient understanding of how to access instructions and care plan via MyChart confirmed with patient.     Telephone follow up appointment with care management team member scheduled for: 03/01/23  Verna Czech, LCSW [place]  [facility] Institute, [facility] Licensed Clinical Social Worker  Care Coordinator  Direct Dial: [phone]

## 2023-02-12 NOTE — Patient Outreach (Signed)
  Care Coordination   Follow Up Visit Note   02/12/2023 Name: Katherine Olson MRN: [id number] DOB: 06/15/1926  Katherine Olson is a 89 y.o. year old female who sees Arnett, Lyn Records, FNP for primary care. I spoke with  Katherine Olson 's son Katherine Olson by phone today.  What matters to the patients health and wellness today?  Home Based medical care due to the progression of patient's condition    Goals Addressed             This Visit's Progress    COMPLETED: community resource needs       Interventions Today    Flowsheet Row Most Recent Value  General Interventions   General Interventions Discussed/Reviewed Communication with  Communication with --  [[facility]-respite care referral completed-patient now on waiting list-Health Dept contacted confirmed Flu vaccinations for the home bound can be arranged-contact is Amy [phone] to call to schedule]           in home care options        Activities and task to complete in order to accomplish goals.   EMOTIONAL / MENTAL HEALTH SUPPORT Self Support options  (respite care referral completed to promote self care. Patient placed on waiting list, [facility] will contact patient's son for follow up) Continue to follow up with [facility] PT, OT bath aid and nursing Referral placed with [facility] for home based medical care program-please expect a phone call from the to schedule the initial asessment        SDOH assessments and interventions completed:  No     Care Coordination Interventions:  Yes, provided  Interventions Today    Flowsheet Row Most Recent Value  Chronic Disease   Chronic disease during today's visit Other  General Interventions   General Interventions Discussed/Reviewed General Interventions Reviewed, Community Resources, Level of Care  [confirmed that patient is currenlty receiving HH services through [place], RN, bath aid, PT and OT-finds services beneficial open to referral for home  based medical care through [facility]]  Doctor Visits Discussed/Reviewed Doctor Visits Reviewed  Communication with RN  Public house manager with RNCM]  Level of Care Personal Care Services  [confirmed that pt is currenlty on waiting list for respite care through [facility]]  Education Interventions   Education Provided Provided Education  Provided Verbal Education On Community Resources  [facility] medical Care through [facility]-discussed referral process and eligibility, confirmed that referral was received, [facility] to reach out to patient's son to schedule initial assessment]       Follow up plan: Follow up call scheduled for 03/01/23    Encounter Outcome:  Patient Visit Completed

## 2023-02-15 ENCOUNTER — Encounter: Payer: Self-pay | Admitting: *Deleted

## 2023-02-15 ENCOUNTER — Encounter: Payer: Self-pay | Admitting: Family

## 2023-02-15 NOTE — Patient Outreach (Signed)
  Care Coordination   Collaboration   Visit Note   02/15/2023 Name: AOWYN OBAID MRN: [id number] DOB: 1926/12/14  [name] is a 89 y.o. year old female who sees Arnett, Lyn Records, FNP for primary care.  What matters to the patients health and wellness today?  Home based medical care.    Goals Addressed             This Visit's Progress    in home care options        Activities and task to complete in order to accomplish goals.   [facility] -appointment to be seen by the home-based primary care provider scheduled for Thursday, December 19th at 12pm.        SDOH assessments and interventions completed:  No     Care Coordination Interventions:  Yes, provided  Interventions Today    Flowsheet Row Most Recent Value  General Interventions   General Interventions Discussed/Reviewed Communication with  Communication with --  [confirmed that patient will be seen by the home-based primary care provider on Thursday, December 19th at 12pm through [facility]]       Follow up plan: Follow up call scheduled for 03/01/23    Encounter Outcome:  Patient Visit Completed

## 2023-02-16 ENCOUNTER — Encounter: Payer: Medicare HMO | Admitting: *Deleted

## 2023-02-16 DIAGNOSIS — Z792 Long term (current) use of antibiotics: Secondary | ICD-10-CM | POA: Diagnosis not present

## 2023-02-16 DIAGNOSIS — I10 Essential (primary) hypertension: Secondary | ICD-10-CM | POA: Diagnosis not present

## 2023-02-16 DIAGNOSIS — G309 Alzheimer's disease, unspecified: Secondary | ICD-10-CM | POA: Diagnosis not present

## 2023-02-16 DIAGNOSIS — F0280 Dementia in other diseases classified elsewhere without behavioral disturbance: Secondary | ICD-10-CM | POA: Diagnosis not present

## 2023-02-16 DIAGNOSIS — N390 Urinary tract infection, site not specified: Secondary | ICD-10-CM | POA: Diagnosis not present

## 2023-02-16 DIAGNOSIS — M6289 Other specified disorders of muscle: Secondary | ICD-10-CM | POA: Diagnosis not present

## 2023-02-16 NOTE — Telephone Encounter (Signed)
Spoke to Katherine Olson and informed him that as soon as we get results we will let him know what they are

## 2023-02-17 ENCOUNTER — Encounter: Payer: Self-pay | Admitting: Family

## 2023-02-17 ENCOUNTER — Telehealth: Payer: Self-pay | Admitting: Family

## 2023-02-17 ENCOUNTER — Telehealth: Payer: Self-pay

## 2023-02-17 ENCOUNTER — Telehealth: Payer: Self-pay | Admitting: *Deleted

## 2023-02-17 DIAGNOSIS — N390 Urinary tract infection, site not specified: Secondary | ICD-10-CM | POA: Diagnosis not present

## 2023-02-17 DIAGNOSIS — Z792 Long term (current) use of antibiotics: Secondary | ICD-10-CM | POA: Diagnosis not present

## 2023-02-17 DIAGNOSIS — M6289 Other specified disorders of muscle: Secondary | ICD-10-CM | POA: Diagnosis not present

## 2023-02-17 DIAGNOSIS — I10 Essential (primary) hypertension: Secondary | ICD-10-CM | POA: Diagnosis not present

## 2023-02-17 DIAGNOSIS — F0280 Dementia in other diseases classified elsewhere without behavioral disturbance: Secondary | ICD-10-CM | POA: Diagnosis not present

## 2023-02-17 DIAGNOSIS — G309 Alzheimer's disease, unspecified: Secondary | ICD-10-CM | POA: Diagnosis not present

## 2023-02-17 NOTE — Patient Outreach (Signed)
  Care Coordination   Follow Up Visit Note   02/18/2023 Name: Katherine Olson MRN: [id number] DOB: 1926/06/10  Katherine Olson is a 89 y.o. year old female who sees Arnett, Lyn Records, FNP for primary care. I spoke with  Katherine Olson's son by phone today.  What matters to the patients health and wellness today?  Home Based Medical Care and patient's lab results    Goals Addressed             This Visit's Progress    in home care options        Activities and task to complete in order to accomplish goals.   [facility] -confirmed initial appointment for home-based primary care provider scheduled for Thursday, December 19th at 12pm. Patient's son to follow up with [facility] and provider office regarding patient's lab results        SDOH assessments and interventions completed:  No     Care Coordination Interventions:  Yes, provided   Interventions Today    Flowsheet Row Most Recent Value  Chronic Disease   Chronic disease during today's visit Other  [dementia]  General Interventions   General Interventions Discussed/Reviewed General Interventions Discussed, Doctor Visits  [Pt's son contacted CSW to confirm [facility] services contact as well as to confirm appt and contact for in home medical care through [facility] also requesting lab results from [employer]. son encouraged to contact pt's provider's office]  Doctor Visits Discussed/Reviewed Doctor Visits Discussed  [confirmed initial assessment for in home medical care through  [facility] scheduled for 12/19 at 12pm]        Follow up plan: Follow up call scheduled for 02/18/23    Encounter Outcome:  Patient Visit Completed

## 2023-02-17 NOTE — Telephone Encounter (Signed)
Copied from CRM [id number]. Topic: Clinical - Lab/Test Results >> Feb 17, 2023  2:21 PM Pascal Lux wrote: Reason for CRM: Patient son [name] called to check if labs were received from [employer]. And when will they be posted on MyChart. - Reached out to clinic access line and was advise to place a request for someone to reach out regarding labs.

## 2023-02-17 NOTE — Telephone Encounter (Signed)
Copied from CRM [id number]. Topic: General - Call Back - No Documentation >> Feb 17, 2023  2:38 PM Samuel Jester B wrote: Reason for CRM: PT missed a call from nurse [facility] and would like for her to give her a callback.

## 2023-02-18 ENCOUNTER — Ambulatory Visit: Payer: Self-pay | Admitting: *Deleted

## 2023-02-18 DIAGNOSIS — Z7189 Other specified counseling: Secondary | ICD-10-CM | POA: Diagnosis not present

## 2023-02-18 DIAGNOSIS — Z792 Long term (current) use of antibiotics: Secondary | ICD-10-CM | POA: Diagnosis not present

## 2023-02-18 DIAGNOSIS — I10 Essential (primary) hypertension: Secondary | ICD-10-CM | POA: Diagnosis not present

## 2023-02-18 DIAGNOSIS — N182 Chronic kidney disease, stage 2 (mild): Secondary | ICD-10-CM | POA: Insufficient documentation

## 2023-02-18 DIAGNOSIS — G309 Alzheimer's disease, unspecified: Secondary | ICD-10-CM | POA: Diagnosis not present

## 2023-02-18 DIAGNOSIS — E785 Hyperlipidemia, unspecified: Secondary | ICD-10-CM | POA: Diagnosis not present

## 2023-02-18 DIAGNOSIS — Z0000 Encounter for general adult medical examination without abnormal findings: Secondary | ICD-10-CM | POA: Diagnosis not present

## 2023-02-18 DIAGNOSIS — M6289 Other specified disorders of muscle: Secondary | ICD-10-CM | POA: Diagnosis not present

## 2023-02-18 DIAGNOSIS — F0280 Dementia in other diseases classified elsewhere without behavioral disturbance: Secondary | ICD-10-CM | POA: Diagnosis not present

## 2023-02-18 DIAGNOSIS — N390 Urinary tract infection, site not specified: Secondary | ICD-10-CM | POA: Diagnosis not present

## 2023-02-18 NOTE — Patient Outreach (Signed)
  Care Coordination   Follow Up Visit Note   02/18/2023 Name: Katherine Olson MRN: [id number] DOB: [birth year]  Katherine Olson is a 89 y.o. year old female who sees Arnett, Lyn Records, FNP for primary care. I spoke with  Katherine Olson 's son Katherine Olson by phone today.  What matters to the patients health and wellness today?  Home Based Medical Care    Goals Addressed             This Visit's Progress    in home care options        Activities and task to complete in order to accomplish goals.   [facility] -confirmed initial appointment for home-based primary care provider scheduled for Thursday, December 19th at 12pm completed-patient now active with [facility] based medical care through [facility]        SDOH assessments and interventions completed:  No     Care Coordination Interventions:  Yes, provided  Interventions Today    Flowsheet Row Most Recent Value  Chronic Disease   Chronic disease during today's visit Other  [dementia]  General Interventions   General Interventions Discussed/Reviewed General Interventions Reviewed, Doctor Visits  [Needs assessment completed, confirmed that patient is now followed by home based medical care.]  Doctor Visits Discussed/Reviewed Doctor Visits Reviewed  [patient's son confirmed continued [facility] services-bath aid came today as well as home based MD through [facility]-pt's son pleased with assessment, MD to follow up monthly]  Mental Health Interventions   Mental Health Discussed/Reviewed Mental Health Discussed  [confirmed that caregiver stress relieved with start of home based medical care. postive reinforcement provided for close medical follow up for patient]       Follow up plan: No further intervention required.   Encounter Outcome:  Patient Visit Completed

## 2023-02-18 NOTE — Patient Instructions (Signed)
Visit Information  Thank you for taking time to visit with me today. Please don't hesitate to contact me if I can be of assistance to you.   Following are the goals we discussed today:   Goals Addressed             This Visit's Progress    in home care options        Activities and task to complete in order to accomplish goals.   [facility] -confirmed initial appointment for home-based primary care provider scheduled for Thursday, December 19th at 12pm completed-patient now active with home based medical care through [facility]       If you are experiencing a Mental Health or Behavioral Health Crisis or need someone to talk to, please call 911   Patient verbalizes understanding of instructions and care plan provided today and agrees to view in MyChart. Active MyChart status and patient understanding of how to access instructions and care plan via MyChart confirmed with patient.     No further follow up required: patient's son to contact this Child psychotherapist with any additional community resource needs.  Mumtaz Lovins, LCSW [place]  [facility], [facility] Licensed Clinical Social Worker Care Coordinator  Direct Dial: [phone]

## 2023-02-18 NOTE — Patient Instructions (Signed)
Visit Information  Thank you for taking time to visit with me today. Please don't hesitate to contact me if I can be of assistance to you.   Following are the goals we discussed today:   Goals Addressed             This Visit's Progress    in home care options        Activities and task to complete in order to accomplish goals.   [facility] -confirmed initial appointment for home-based primary care provider scheduled for Thursday, December 19th at 12pm. Patient's son to follow up with [facility] and provider office regarding patient's lab results        Our next appointment is by telephone on 02/18/23 at 8:30am  Please call the care guide team at [phone] if you need to cancel or reschedule your appointment.   If you are experiencing a Mental Health or Behavioral Health Crisis or need someone to talk to, please call 911   Patient verbalizes understanding of instructions and care plan provided today and agrees to view in MyChart. Active MyChart status and patient understanding of how to access instructions and care plan via MyChart confirmed with patient.     Telephone follow up appointment with care management team member scheduled for: 02/18/23   Verna Czech, LCSW [place]  Value[facility] Care Institute, [facility] Licensed Clinical Social Worker Care Coordinator  Direct Dial: [phone]

## 2023-02-19 ENCOUNTER — Telehealth: Payer: Self-pay | Admitting: Family

## 2023-02-19 ENCOUNTER — Telehealth: Payer: Self-pay

## 2023-02-19 DIAGNOSIS — Z792 Long term (current) use of antibiotics: Secondary | ICD-10-CM | POA: Diagnosis not present

## 2023-02-19 DIAGNOSIS — N390 Urinary tract infection, site not specified: Secondary | ICD-10-CM | POA: Diagnosis not present

## 2023-02-19 DIAGNOSIS — I10 Essential (primary) hypertension: Secondary | ICD-10-CM | POA: Diagnosis not present

## 2023-02-19 DIAGNOSIS — F0280 Dementia in other diseases classified elsewhere without behavioral disturbance: Secondary | ICD-10-CM | POA: Diagnosis not present

## 2023-02-19 DIAGNOSIS — M6289 Other specified disorders of muscle: Secondary | ICD-10-CM | POA: Diagnosis not present

## 2023-02-19 DIAGNOSIS — G309 Alzheimer's disease, unspecified: Secondary | ICD-10-CM | POA: Diagnosis not present

## 2023-02-19 MED ORDER — NITROFURANTOIN MONOHYD MACRO 100 MG PO CAPS: 100.0000 mg | ORAL_CAPSULE | Freq: Two times a day (BID) | ORAL | 0 refills | Status: DC

## 2023-02-19 NOTE — Telephone Encounter (Signed)
Katherine Olson brought results over to me.  Patient UTI with enterococcus sensitive to Macrobid.  CMA notes patient is still having urinary symptoms.  Macrobid 100 mg twice daily for 5 days sent to pharmacy.  Dosing confirmed with creatinine clearance calculation.  If she has persistent symptoms despite this treatment she needs to be seen in the office.

## 2023-02-19 NOTE — Telephone Encounter (Signed)
Spoke to [place] and discussed labs see note in pt chart

## 2023-02-19 NOTE — Addendum Note (Signed)
Addended by: Glori Luis on: 02/19/2023 04:49 PM   Modules accepted: Orders

## 2023-02-19 NOTE — Telephone Encounter (Signed)
Copied from CRM [id number]. Topic: General - Call Back - No Documentation >> Feb 19, 2023  4:00 PM Pascal Lux wrote: Reason for CRM: Patient's son called saying he missed a call from a nurse regarding his mother. He requested the nurse to call him back to follow up [phone].

## 2023-02-19 NOTE — Telephone Encounter (Signed)
Copied from CRM [id number]. Topic: General - Call Back - No Documentation >> Feb 19, 2023  3:22 PM Adaysia C wrote: Reason for CRM: Patients son called saying he missed a call from a nurse regarding his mother. Her son requested the nurse to call him back to follow up [phone]

## 2023-02-19 NOTE — Telephone Encounter (Signed)
LVM to call back just returning pt call

## 2023-02-19 NOTE — Telephone Encounter (Signed)
LVM to call back to office returning pt call

## 2023-02-20 ENCOUNTER — Other Ambulatory Visit: Payer: Self-pay | Admitting: Family

## 2023-02-20 DIAGNOSIS — N189 Chronic kidney disease, unspecified: Secondary | ICD-10-CM

## 2023-02-22 ENCOUNTER — Telehealth: Payer: Self-pay

## 2023-02-22 NOTE — Telephone Encounter (Signed)
Phone call to [name], son on [place].  He would like a copy of the most recent UA and culture.  Records printed and put up front for pick up. He also explained that he is having trouble getting the patient out of the home for appointments.  He has worked with [facility] to find an in home physician.  He asked if Claris Che would continue to be his PCP and I explained that he did not need two PCPs and if he could not get her out of the home that an in home provider was a great thing for them.  [name] reported that he wanted to talk more with the in home physician.  Sending to [facility] as an [place].

## 2023-02-22 NOTE — Telephone Encounter (Signed)
Spoke to pt on 02/19/23 on numerous ocassions to discuss concerns

## 2023-02-22 NOTE — Telephone Encounter (Signed)
Copied from CRM [id number]. Topic: Medical Record Request - Records Request >> Feb 22, 2023  7:46 AM Adele Barthel wrote: Reason for CRM: Pt's son has called requesting record of recent results from urinalysis and culture. Is establishing care with in home physician and needs to provide records to new provider. Would like to pick up records this morning at the office, will be around 9 am. Requests callback from Dr Arnett's nurse, Addison Naegeli. CB# [phone]

## 2023-02-22 NOTE — Telephone Encounter (Signed)
Copied from CRM [id number]. Topic: Medical Record Request - Other >> Feb 22, 2023  9:00 AM Prudencio Pair wrote: Reason for CRM: Son of patient, Katherine Olson, following up on trying to obtain copies of pt's urinalysis & cultures. He's trying to pick the documents up today around 9/9:30 AM. Spoke with Cala Bradford via CAL line and she stated to send another message back to clinical & let him know that they can give him a call when it's ready for pick up. CB #: [phone].

## 2023-02-23 NOTE — Telephone Encounter (Signed)
See previous notes in chart has been handled

## 2023-02-25 DIAGNOSIS — I10 Essential (primary) hypertension: Secondary | ICD-10-CM | POA: Diagnosis not present

## 2023-02-25 DIAGNOSIS — F0280 Dementia in other diseases classified elsewhere without behavioral disturbance: Secondary | ICD-10-CM | POA: Diagnosis not present

## 2023-02-25 DIAGNOSIS — Z792 Long term (current) use of antibiotics: Secondary | ICD-10-CM | POA: Diagnosis not present

## 2023-02-25 DIAGNOSIS — N390 Urinary tract infection, site not specified: Secondary | ICD-10-CM | POA: Diagnosis not present

## 2023-02-25 DIAGNOSIS — M6289 Other specified disorders of muscle: Secondary | ICD-10-CM | POA: Diagnosis not present

## 2023-02-25 DIAGNOSIS — G309 Alzheimer's disease, unspecified: Secondary | ICD-10-CM | POA: Diagnosis not present

## 2023-02-28 ENCOUNTER — Other Ambulatory Visit: Payer: Self-pay

## 2023-02-28 ENCOUNTER — Encounter: Payer: Self-pay | Admitting: Emergency Medicine

## 2023-02-28 DIAGNOSIS — I10 Essential (primary) hypertension: Secondary | ICD-10-CM | POA: Diagnosis not present

## 2023-02-28 DIAGNOSIS — R4182 Altered mental status, unspecified: Secondary | ICD-10-CM | POA: Insufficient documentation

## 2023-02-28 LAB — CBC WITH DIFFERENTIAL/PLATELET
Abs Immature Granulocytes: 0.03 10*3/uL (ref 0.00–0.07)
Basophils Absolute: 0.1 10*3/uL (ref 0.0–0.1)
Basophils Relative: 1 %
Eosinophils Absolute: 0.1 10*3/uL (ref 0.0–0.5)
Eosinophils Relative: 1 %
HCT: 37.4 % (ref 36.0–46.0)
Hemoglobin: 12.7 g/dL (ref 12.0–15.0)
Immature Granulocytes: 0 %
Lymphocytes Relative: 27 %
Lymphs Abs: 2.3 10*3/uL (ref 0.7–4.0)
MCH: 30.5 pg (ref 26.0–34.0)
MCHC: 34 g/dL (ref 30.0–36.0)
MCV: 89.7 fL (ref 80.0–100.0)
Monocytes Absolute: 0.7 10*3/uL (ref 0.1–1.0)
Monocytes Relative: 9 %
Neutro Abs: 5.3 10*3/uL (ref 1.7–7.7)
Neutrophils Relative %: 62 %
Platelets: 244 10*3/uL (ref 150–400)
RBC: 4.17 MIL/uL (ref 3.87–5.11)
RDW: 12.8 % (ref 11.5–15.5)
WBC: 8.5 10*3/uL (ref 4.0–10.5)
nRBC: 0 % (ref 0.0–0.2)

## 2023-02-28 LAB — LACTIC ACID, PLASMA: Lactic Acid, Venous: 1 mmol/L (ref 0.5–1.9)

## 2023-02-28 LAB — COMPREHENSIVE METABOLIC PANEL
ALT: 9 U/L (ref 0–44)
AST: 18 U/L (ref 15–41)
Albumin: 3.9 g/dL (ref 3.5–5.0)
Alkaline Phosphatase: 60 U/L (ref 38–126)
Anion gap: 12 (ref 5–15)
BUN: 11 mg/dL (ref 8–23)
CO2: 22 mmol/L (ref 22–32)
Calcium: 10.2 mg/dL (ref 8.9–10.3)
Chloride: 100 mmol/L (ref 98–111)
Creatinine, Ser: 0.92 mg/dL (ref 0.44–1.00)
GFR, Estimated: 58 mL/min — ABNORMAL LOW (ref >60)
Glucose, Bld: 97 mg/dL (ref 70–99)
Potassium: 4 mmol/L (ref 3.5–5.1)
Sodium: 134 mmol/L — ABNORMAL LOW (ref 135–145)
Total Bilirubin: 0.6 mg/dL (ref <1.2)
Total Protein: 7.4 g/dL (ref 6.5–8.1)

## 2023-02-28 NOTE — ED Triage Notes (Signed)
First Nurse Note:  Pt via [facility] from home. Pt c/o AMS and weakness, family concerned it is a UTI, pt already on abx for same. Pt hx of dementia. Family states altered from normal. Pt is A&Ox3 and NAD  72 HR  95% on RA 129/50 BP  98.8 oral.

## 2023-02-28 NOTE — ED Notes (Signed)
This RN was called over by patient's family member again requesting how much longer and what they should do.  RN informed family unable to give wait times and unable to given advice about leaving or staying and apologized for the circumstances.

## 2023-02-28 NOTE — ED Triage Notes (Signed)
Recent UTI's.  Just finished antibiotics.  Son states patient has been acting 'off' x 3 days.  Reports generalized weakness, no energy, unable to stand without assistance.

## 2023-03-01 ENCOUNTER — Telehealth: Payer: Self-pay | Admitting: *Deleted

## 2023-03-01 ENCOUNTER — Encounter: Payer: Self-pay | Admitting: *Deleted

## 2023-03-01 ENCOUNTER — Telehealth: Payer: Self-pay | Admitting: Family

## 2023-03-01 ENCOUNTER — Telehealth: Payer: Self-pay

## 2023-03-01 ENCOUNTER — Emergency Department
Admission: EM | Admit: 2023-03-01 | Discharge: 2023-03-01 | Disposition: A | Discharge status: Home / Self Care | Payer: Medicare HMO | Attending: Emergency Medicine | Admitting: Emergency Medicine

## 2023-03-01 DIAGNOSIS — R4182 Altered mental status, unspecified: Secondary | ICD-10-CM

## 2023-03-01 HISTORY — DX: Unspecified dementia, unspecified severity, without behavioral disturbance, psychotic disturbance, mood disturbance, and anxiety: F03.90

## 2023-03-01 HISTORY — DX: Unspecified osteoarthritis, unspecified site: M19.90

## 2023-03-01 LAB — URINALYSIS, W/ REFLEX TO CULTURE (INFECTION SUSPECTED)
Bilirubin Urine: NEGATIVE
Glucose, UA: NEGATIVE mg/dL
Ketones, ur: NEGATIVE mg/dL
Nitrite: NEGATIVE
Protein, ur: NEGATIVE mg/dL
Specific Gravity, Urine: 1.012 (ref 1.005–1.030)
pH: 7 (ref 5.0–8.0)

## 2023-03-01 NOTE — Patient Outreach (Signed)
  Care Coordination   03/01/2023 Name: Katherine Olson MRN: [id number] DOB: 12-03-1926   Care Coordination Outreach Attempts:  An unsuccessful telephone outreach was attempted today to offer the patient information about available complex care management services.  Follow Up Plan:  Additional outreach attempts will be made to offer the patient complex care management information and services.   Encounter Outcome:  No Answer   Care Coordination Interventions:  No, not indicated    Evelin Cake, LCSW [place]  [facility], [facility] Health Licensed Clinical Social Worker Care Coordinator  Direct Dial: [phone]

## 2023-03-01 NOTE — Telephone Encounter (Signed)
Pt seen in ed today, urine culture in process

## 2023-03-01 NOTE — Telephone Encounter (Signed)
It is scanned in pt chart already

## 2023-03-01 NOTE — Telephone Encounter (Signed)
It should have been sent to scan. Her culture was sensitive to macrobid. From what I remember it was resistant to bactrim.

## 2023-03-01 NOTE — Telephone Encounter (Signed)
Pt has established care with home health Dr Darleene Cleaver Seen 02/18/23

## 2023-03-01 NOTE — Telephone Encounter (Signed)
Report was given to [facility]

## 2023-03-01 NOTE — Patient Outreach (Signed)
  Care Coordination   Documentation  Visit Note   03/01/2023 Name: EIRENE LLERA MRN: [id number] DOB: 1926/07/28  [name] is a 89 y.o. year old female who sees Annita Brod, MD for primary care. I  spoke with [facility] and Practice Manager  What matters to the patients health and wellness today?  Discussed recent ED visit.    Goals Addressed             This Visit's Progress    care coordination activities       Interventions Today    Flowsheet Row Most Recent Value  General Interventions   General Interventions Discussed/Reviewed Communication with  Communication with RN  [facility] and practice manager regarding patient's recent ED visit with concerns for a UTI, unsuccessful call to patient's son today, RN/CSW to continue to follow to educate on in home medical care, symptoms progression and use of ED]               SDOH assessments and interventions completed:  No     Care Coordination Interventions:  Yes, provided   Follow up plan: Follow up call scheduled for 03/05/23    Encounter Outcome:  Patient Visit Completed

## 2023-03-01 NOTE — ED Provider Notes (Signed)
[facility] Provider Note   Event Date/Time   First MD Initiated Contact with Patient 03/01/23 0117     (approximate) History  No chief complaint on file.  HPI Katherine Olson is a 89 y.o. female who presents via EMS from home with complaints of altered mental status and weakness per family.  Family concerned the patient may have a urinary tract infection and she was recently treated for urinary tract infection and finished course approximately 1 week ago.  Patient's son at bedside and states that she has had increasing weakness over the last 24 hours. ROS: Patient currently denies any vision changes, tinnitus, difficulty speaking, facial droop, sore throat, chest pain, shortness of breath, abdominal pain, nausea/vomiting/diarrhea, dysuria, or weakness/numbness/paresthesias in any extremity   Physical Exam  Triage Vital Signs: ED Triage Vitals  Encounter Vitals Group     BP 02/28/23 1504 (!) 152/90     Systolic BP Percentile --      Diastolic BP Percentile --      Pulse Rate 02/28/23 1504 78     Resp 02/28/23 1504 16     Temp 02/28/23 1504 98 F (36.7 C)     Temp Source 02/28/23 1504 Oral     SpO2 02/28/23 1504 93 %     Weight 02/28/23 1505 160 lb 0.9 oz (72.6 kg)     Height --      Head Circumference --      Peak Flow --      Pain Score --      Pain Loc --      Pain Education --      Exclude from Growth Chart --    Most recent vital signs: Vitals:   02/28/23 2146 03/01/23 0321  BP: (!) 142/82 (!) 141/80  Pulse: 83 81  Resp: 16 16  Temp: 98.2 F (36.8 C) 98.2 F (36.8 C)  SpO2: 94% 94%   General: Awake, oriented x3. CV:  Good peripheral perfusion.  Resp:  Normal effort.  Abd:  No distention.  Other:  Elderly well-developed, well-nourished African-American female resting comfortably in no acute distress ED Results / Procedures / Treatments  Labs (all labs ordered are listed, but only abnormal results are displayed) Labs Reviewed   COMPREHENSIVE METABOLIC PANEL - Abnormal; Notable for the following components:      Result Value   Sodium 134 (*)    GFR, Estimated 58 (*)    All other components within normal limits  URINALYSIS, W/ REFLEX TO CULTURE (INFECTION SUSPECTED) - Abnormal; Notable for the following components:   Color, Urine YELLOW (*)    APPearance CLOUDY (*)    Hgb urine dipstick SMALL (*)    Leukocytes,Ua TRACE (*)    Bacteria, UA RARE (*)    All other components within normal limits  URINE CULTURE  LACTIC ACID, PLASMA  CBC WITH DIFFERENTIAL/PLATELET  LACTIC ACID, PLASMA  (s): No results found. PROCEDURES: Critical Care performed: No Procedures MEDICATIONS ORDERED IN ED: Medications - No data to display IMPRESSION / MDM / ASSESSMENT AND PLAN / ED COURSE  I reviewed the triage vital signs and the nursing notes.                             The patient is on the cardiac monitor to evaluate for evidence of arrhythmia and/or significant heart rate changes. Patient's presentation is most consistent with acute presentation with potential threat to life  or bodily function. The patient suffered an episode of altered mental status, but there is no overt concern for a dangerous emergent cause such as, but not limited to, CNS infection, severe Toxidrome, severe metabolic derangement, or stroke.  Given History, Physical, and Workup the cause appears to be dementia  Disposition: Discharge. At the time of discharge, the patient is back to baseline mental status.   FINAL CLINICAL IMPRESSION(S) / ED DIAGNOSES   Final diagnoses:  Altered mental status, unspecified altered mental status type   Rx / DC Orders   ED Discharge Orders     None      Note:  This document was prepared using Dragon voice recognition software and may include unintentional dictation errors.   Merwyn Katos, MD 03/01/23 [phone]

## 2023-03-01 NOTE — Transitions of Care (Post Inpatient/ED Visit) (Signed)
03/01/2023  Name: Katherine Olson MRN: [id number] DOB: 09/02/1926  Today's TOC FU Call Status: Today's TOC FU Call Status:: Successful TOC FU Call Completed TOC FU Call Complete Date: 03/01/23 Patient's Name and Date of Birth confirmed.  Transition Care Management Follow-up Telephone Call Date of Discharge: 03/01/23 Discharge Facility: [facility] Type of Discharge: Emergency Department Reason for ED Visit: Other: (altered mental status) How have you been since you were released from the hospital?: Same Any questions or concerns?: No  Items Reviewed: Did you receive and understand the discharge instructions provided?: No Medications obtained,verified, and reconciled?: Yes (Medications Reviewed) Any new allergies since your discharge?: No Dietary orders reviewed?: Yes Do you have support at home?: Yes People in Home: child(ren), adult  Medications Reviewed Today: Medications Reviewed Today     Reviewed by Karena Addison, LPN (Licensed Practical Nurse) on 03/01/23 at 1548  Med List Status: <None>   Medication Order Taking? Sig Documenting Provider Last Dose Status Informant  amLODipine (NORVASC) 5 MG tablet [id number] No Take 1 tablet (5 mg total) by mouth every morning. Allegra Grana, FNP Taking Active   citalopram (CELEXA) 20 MG tablet [id number]  TAKE 1 TABLET(20 MG) BY MOUTH EVERY MORNING Allegra Grana, FNP  Active   Coenzyme Q-10 200 MG CAPS [id number] No Take by mouth. [provider] Taking Active   conjugated estrogens (PREMARIN) vaginal cream [id number] No 1 applicatorful vaginally 1-2 times per week. Allegra Grana, FNP Taking Active   losartan (COZAAR) 50 MG tablet [id number] No Take 1 tablet (50 mg total) by mouth daily. Allegra Grana, FNP Taking Active   Multiple Vitamins-Minerals (MULTIVITAMIN WITH MINERALS) tablet [id number] No Take 1 tablet by mouth daily. [provider] Taking Active   nitrofurantoin,  macrocrystal-monohydrate, (MACROBID) 100 MG capsule [id number]  Take 1 capsule (100 mg total) by mouth 2 (two) times daily. Glori Luis, MD  Active   Omega-3 Fatty Acids (FISH OIL PO) [id number] No Take by mouth. [provider] Taking Active   ondansetron (ZOFRAN-ODT) 4 MG disintegrating tablet [id number] No Take 1 tablet (4 mg total) by mouth every 8 (eight) hours as needed for nausea or vomiting.  Patient not taking: Reported on 02/03/2023   Valinda Hoar, NP Not Taking Active   pravastatin (PRAVACHOL) 20 MG tablet [id number] No Take 1 tablet (20 mg total) by mouth daily. Allegra Grana, FNP Taking Active   sulfamethoxazole-trimethoprim (BACTRIM DS) 800-160 MG tablet [id number] No Take 0.5 tablets by mouth daily. Allegra Grana, FNP Taking Active   terconazole (TERAZOL 3) 0.8 % vaginal cream [id number] No Place 1 applicator vaginally at bedtime. For 3 days Flinchum, Eula Fried, FNP Taking Active             Home Care and Equipment/Supplies: Were Home Health Services Ordered?: NA Any new equipment or medical supplies ordered?: NA  Functional Questionnaire: Do you need assistance with bathing/showering or dressing?: Yes Do you need assistance with meal preparation?: Yes Do you need assistance with eating?: No Do you have difficulty maintaining continence: Yes Do you need assistance with getting out of bed/getting out of a chair/moving?: Yes Do you have difficulty managing or taking your medications?: Yes  Follow up appointments reviewed: PCP Follow-up appointment confirmed?: No (transferred PCP) MD Provider Line [phone] Given: No Specialist Hospital Follow-up appointment confirmed?: NA Do you need transportation to your follow-up appointment?: No Do you understand care options if your condition(s) worsen?: Yes-patient verbalized understanding  SIGNATURE Karena Addison, LPN [facility] Nurse Health Advisor Direct Dial [phone]

## 2023-03-02 ENCOUNTER — Telehealth: Payer: Self-pay | Admitting: *Deleted

## 2023-03-02 DIAGNOSIS — G309 Alzheimer's disease, unspecified: Secondary | ICD-10-CM | POA: Diagnosis not present

## 2023-03-02 DIAGNOSIS — F0280 Dementia in other diseases classified elsewhere without behavioral disturbance: Secondary | ICD-10-CM | POA: Diagnosis not present

## 2023-03-02 DIAGNOSIS — N390 Urinary tract infection, site not specified: Secondary | ICD-10-CM | POA: Diagnosis not present

## 2023-03-02 DIAGNOSIS — M6289 Other specified disorders of muscle: Secondary | ICD-10-CM | POA: Diagnosis not present

## 2023-03-02 DIAGNOSIS — Z792 Long term (current) use of antibiotics: Secondary | ICD-10-CM | POA: Diagnosis not present

## 2023-03-02 DIAGNOSIS — I10 Essential (primary) hypertension: Secondary | ICD-10-CM | POA: Diagnosis not present

## 2023-03-02 NOTE — Patient Outreach (Signed)
  Care Coordination   Follow Up Visit Note   03/02/2023 Name: Katherine Olson MRN: [id number] DOB: Nov 29, 1926  Katherine Olson is a 89 y.o. year old female who sees Karlene Mungo, MD for primary care. I spoke with  Katherine Olson 's son by phone today.  What matters to the patients health and wellness today?  Management of re-current UTI's-per patient's son   Goals Addressed             This Visit's Progress    care coordination activities       Interventions Today    Flowsheet Row Most Recent Value  Chronic Disease   Chronic disease during today's visit Other  [dementia]  General Interventions   General Interventions Discussed/Reviewed General Interventions Reviewed, Communication with, Doctor Visits  [Recent ED visit to to altered mental status, son concerned about possibility of a UTI-voiced concern that he was not able to reach in home provider. Pt conitnues to receives [facility] services]  Doctor Visits Discussed/Reviewed Doctor Visits Reviewed  [patient now enrolled in home medical care with [facility]-follow up scheduled for 03/10/22]  Communication with --  [in home medical care CM Danielle discussed pt's condition and current care-wkg with family on care expectations, UTI prevention and Alzheimer's treatment]               SDOH assessments and interventions completed:  No     Care Coordination Interventions:  Yes, provided   Follow up plan: Follow up call scheduled for 03/05/23    Encounter Outcome:  Patient Visit Completed

## 2023-03-02 NOTE — Patient Instructions (Signed)
 Visit Information  Thank you for taking time to visit with me today. Please don't hesitate to contact me if I can be of assistance to you.   Following are the goals we discussed today:   Keep all upcoming appointment discussed today Continue with compliance of taking medication prescribed by Doctor Self Support options(continue follow up with in home medical care through [facility] as well as home health services through [facility]    Our next appointment is by telephone on 03/05/23 at 1pm  Please call the care guide team at [phone] if you need to cancel or reschedule your appointment.   If you are experiencing a Mental Health or Behavioral Health Crisis or need someone to talk to, please call 911   Patient verbalizes understanding of instructions and care plan provided today and agrees to view in MyChart. Active MyChart status and patient understanding of how to access instructions and care plan via MyChart confirmed with patient.     Telephone follow up appointment with care management team member scheduled for: 03/05/23  Lenn Mean, LCSW [place]  [facility], [facility] Licensed Clinical Social Worker Care Coordinator  Direct Dial: [phone]

## 2023-03-03 LAB — URINE CULTURE: Culture: >=100000 — AB

## 2023-03-04 DIAGNOSIS — G309 Alzheimer's disease, unspecified: Secondary | ICD-10-CM | POA: Diagnosis not present

## 2023-03-04 DIAGNOSIS — Z792 Long term (current) use of antibiotics: Secondary | ICD-10-CM | POA: Diagnosis not present

## 2023-03-04 DIAGNOSIS — F0280 Dementia in other diseases classified elsewhere without behavioral disturbance: Secondary | ICD-10-CM | POA: Diagnosis not present

## 2023-03-04 DIAGNOSIS — I10 Essential (primary) hypertension: Secondary | ICD-10-CM | POA: Diagnosis not present

## 2023-03-04 DIAGNOSIS — N390 Urinary tract infection, site not specified: Secondary | ICD-10-CM | POA: Diagnosis not present

## 2023-03-04 DIAGNOSIS — M6289 Other specified disorders of muscle: Secondary | ICD-10-CM | POA: Diagnosis not present

## 2023-03-05 ENCOUNTER — Ambulatory Visit: Payer: Self-pay | Admitting: *Deleted

## 2023-03-05 NOTE — Patient Outreach (Signed)
  Care Coordination   Follow Up Visit Note   03/05/2023 Name: Katherine Olson MRN: [id number] DOB: [birth year]  Katherine Olson is a 89 y.o. year old female who sees Katherine Mungo, MD for primary care. I spoke with  Katherine Olson' s son Katherine Olson by phone today.  What matters to the patients health and wellness today?  Patient now receiving home medical care through [facility]. Patient has no additional community resource needs at this time.    Goals Addressed             This Visit's Progress    care coordination activities       Interventions Today    Flowsheet Row Most Recent Value  Chronic Disease   Chronic disease during today's visit Other  [dementia]  General Interventions   General Interventions Discussed/Reviewed General Interventions Reviewed, Communication with, Doctor Visits  [additional needs assessment completed-patient now active with [facility]-son confirms finding service very benefiical-no additional needs at this time]  Doctor Visits Discussed/Reviewed --  [follow up appt with home medical provider through Medicare scheduled for 03/11/23]  Communication with RN  [facility] for home medical team to share lab results and medication recommendations  that were sent to this social worker in error-she will review and contact patient]               SDOH assessments and interventions completed:  No     Care Coordination Interventions:  Yes, provided   Follow up plan: No further intervention required.   Encounter Outcome:  Patient Visit Completed

## 2023-03-05 NOTE — Patient Instructions (Signed)
 Visit Information  Thank you for taking time to visit with me today. Please don't hesitate to contact me if I can be of assistance to you.   Following are the goals we discussed today:  Patient to continue follow up with the home medical  program through [facility] and will contact them with any additional follow up needs [phone]    If you are experiencing a Mental Health or Behavioral Health Crisis or need someone to talk to, please call 911   Patient verbalizes understanding of instructions and care plan provided today and agrees to view in MyChart. Active MyChart status and patient understanding of how to access instructions and care plan via MyChart confirmed with patient.     No further follow up required: Patient's son to contact the home medical program through [facility] with any additional follow up needs [phone]  Lenn Mean, LCSW [place]  [facility], [facility] Licensed Clinical Social Worker Care Coordinator  Direct Dial: [phone]

## 2023-03-07 DIAGNOSIS — I10 Essential (primary) hypertension: Secondary | ICD-10-CM | POA: Diagnosis not present

## 2023-03-07 DIAGNOSIS — N390 Urinary tract infection, site not specified: Secondary | ICD-10-CM | POA: Diagnosis not present

## 2023-03-07 DIAGNOSIS — G309 Alzheimer's disease, unspecified: Secondary | ICD-10-CM | POA: Diagnosis not present

## 2023-03-07 DIAGNOSIS — F0280 Dementia in other diseases classified elsewhere without behavioral disturbance: Secondary | ICD-10-CM | POA: Diagnosis not present

## 2023-03-07 DIAGNOSIS — Z792 Long term (current) use of antibiotics: Secondary | ICD-10-CM | POA: Diagnosis not present

## 2023-03-07 DIAGNOSIS — M6289 Other specified disorders of muscle: Secondary | ICD-10-CM | POA: Diagnosis not present

## 2023-03-09 DIAGNOSIS — F0280 Dementia in other diseases classified elsewhere without behavioral disturbance: Secondary | ICD-10-CM | POA: Diagnosis not present

## 2023-03-09 DIAGNOSIS — M6289 Other specified disorders of muscle: Secondary | ICD-10-CM | POA: Diagnosis not present

## 2023-03-09 DIAGNOSIS — G309 Alzheimer's disease, unspecified: Secondary | ICD-10-CM | POA: Diagnosis not present

## 2023-03-09 DIAGNOSIS — I10 Essential (primary) hypertension: Secondary | ICD-10-CM | POA: Diagnosis not present

## 2023-03-09 DIAGNOSIS — Z792 Long term (current) use of antibiotics: Secondary | ICD-10-CM | POA: Diagnosis not present

## 2023-03-09 DIAGNOSIS — N390 Urinary tract infection, site not specified: Secondary | ICD-10-CM | POA: Diagnosis not present

## 2023-03-11 ENCOUNTER — Telehealth: Payer: Self-pay

## 2023-03-11 DIAGNOSIS — N390 Urinary tract infection, site not specified: Secondary | ICD-10-CM | POA: Diagnosis not present

## 2023-03-11 DIAGNOSIS — I10 Essential (primary) hypertension: Secondary | ICD-10-CM | POA: Diagnosis not present

## 2023-03-11 DIAGNOSIS — G309 Alzheimer's disease, unspecified: Secondary | ICD-10-CM | POA: Diagnosis not present

## 2023-03-11 DIAGNOSIS — N182 Chronic kidney disease, stage 2 (mild): Secondary | ICD-10-CM | POA: Diagnosis not present

## 2023-03-11 NOTE — Telephone Encounter (Signed)
 Copied from CRM [id number]. Topic: Clinical - Home Health Verbal Orders >> Mar 10, 2023  4:00 PM [place] H wrote: Caller/Agency: Jeff/[facility] Callback Number: [phone]/Secure line to leave message Service Requested: Home Health Aid Frequency: Once a week Any new concerns about the patient? No

## 2023-03-12 NOTE — Telephone Encounter (Signed)
 Spoke to : Trey Paula @ [facility]  [phone] and informed him that he needs to reach out to pt  new provider Dr Darleene Cleaver to get verbal orders

## 2023-03-15 DIAGNOSIS — M6289 Other specified disorders of muscle: Secondary | ICD-10-CM | POA: Diagnosis not present

## 2023-03-15 DIAGNOSIS — N390 Urinary tract infection, site not specified: Secondary | ICD-10-CM | POA: Diagnosis not present

## 2023-03-15 DIAGNOSIS — G309 Alzheimer's disease, unspecified: Secondary | ICD-10-CM | POA: Diagnosis not present

## 2023-03-15 DIAGNOSIS — F0280 Dementia in other diseases classified elsewhere without behavioral disturbance: Secondary | ICD-10-CM | POA: Diagnosis not present

## 2023-03-15 DIAGNOSIS — I10 Essential (primary) hypertension: Secondary | ICD-10-CM | POA: Diagnosis not present

## 2023-03-15 DIAGNOSIS — Z792 Long term (current) use of antibiotics: Secondary | ICD-10-CM | POA: Diagnosis not present

## 2023-03-16 DIAGNOSIS — I10 Essential (primary) hypertension: Secondary | ICD-10-CM | POA: Diagnosis not present

## 2023-03-16 DIAGNOSIS — G309 Alzheimer's disease, unspecified: Secondary | ICD-10-CM | POA: Diagnosis not present

## 2023-03-16 DIAGNOSIS — Z792 Long term (current) use of antibiotics: Secondary | ICD-10-CM | POA: Diagnosis not present

## 2023-03-16 DIAGNOSIS — N390 Urinary tract infection, site not specified: Secondary | ICD-10-CM | POA: Diagnosis not present

## 2023-03-16 DIAGNOSIS — M6289 Other specified disorders of muscle: Secondary | ICD-10-CM | POA: Diagnosis not present

## 2023-03-16 DIAGNOSIS — F0280 Dementia in other diseases classified elsewhere without behavioral disturbance: Secondary | ICD-10-CM | POA: Diagnosis not present

## 2023-03-17 ENCOUNTER — Ambulatory Visit: Payer: Self-pay

## 2023-03-17 ENCOUNTER — Telehealth: Payer: Self-pay

## 2023-03-17 NOTE — Patient Outreach (Addendum)
  Care Coordination   Follow Up Visit Note   04/16/2023 Name: Katherine Olson MRN: [id number] DOB: 03-Mar-1926  Katherine Olson is a 89 y.o. year old female who sees No primary care provider on file. for primary care. I  spoke with son / designated party release, [place] Neighbor.   What matters to the patients health and wellness today?  Son states patient seems to be doing ok.  He reports medical home provider visited patient last week. He states patient continues to have home health SN, PT, OT, and home health aid services. Son states patient is taking a maintenance antibiotic for the recurrent UTI's.  Son verbalized appreciation for RN case manager sharing book The 36 hour day for patients with dementia.  Son verbally agreed to caregiver support group information for patients with Dementia.  Son denies any further needs and agrees that care coordination goals have been met.     Goals Addressed             This Visit's Progress    COMPLETED: management of health conditions and community resource needs.       Interventions Today    Flowsheet Row Most Recent Value  Chronic Disease   Chronic disease during today's visit Other  [Dementia / recurrent UTI]  General Interventions   General Interventions Discussed/Reviewed General Interventions Reviewed, Doctor Visits, Communication with  [evaluation of current treatment plan for listed health conditions and patients adherence to plan as established by provider. Assessed for any new symptoms and/ or ongoing UTI symptoms.]  Doctor Visits Discussed/Reviewed Doctor Visits Reviewed  [name] upcoming provider visits. Advised to keep follow up visit with providers as recommended.]  Communication with Social Work  [place] with Social worker]  Education Interventions   Education Provided --  [confirmed patient still receiving home health services. Discussed when to contact provider for concerns versus when to call 911 or take to ED.]  Mental Health  Interventions   Mental Health Discussed/Reviewed Other  [Advised to consider reading book caring for patient with Dementia. Given book example The 36 hour day.  Discussed caregiver considering caregiver support group for patients with Dementia.  Provided phone number to [facility].]  Pharmacy Interventions   Pharmacy Dicussed/Reviewed Pharmacy Topics Reviewed  [medications reviewed and compliance discussed.]              SDOH assessments and interventions completed:  No     Care Coordination Interventions:  Yes, provided   Follow up plan: No further intervention required.   Encounter Outcome:  Patient Visit Completed   George Ina RN,BSN,CCM [facility]  [facility], [facility] coordinator / Case Manager Phone: [phone]

## 2023-03-17 NOTE — Patient Instructions (Signed)
 Visit Information  Thank you for taking time to visit with me today. Your care coodination goals have been met.   What we discussed today:   Goals Addressed             This Visit's Progress    management of health conditions and community resource needs.       Interventions Today    Flowsheet Row Most Recent Value  Chronic Disease   Chronic disease during today's visit Other  [Dementia / recurrent UTI]  General Interventions   General Interventions Discussed/Reviewed General Interventions Reviewed, Doctor Visits, Communication with  [evaluation of current treatment plan for listed health conditions and patients adherence to plan as established by provider. Assessed for any new symptoms and/ or ongoing UTI symptoms.]  Doctor Visits Discussed/Reviewed Doctor Visits Reviewed  Carin Charleston upcoming provider visits. Advised to keep follow up visit with providers as recommended.]  Communication with Social Work  [place] with Social worker]  Education Interventions   Education Provided --  [confirmed patient still receiving home health services. Discussed when to contact provider for concerns versus when to call 911 or take to ED.]  Mental Health Interventions   Mental Health Discussed/Reviewed Other  [Advised to consider reading book caring for patient with Dementia. Given book example The 36 hour day.  Discussed caregiver considering caregiver support group for patients with Dementia.  Provided phone number to [facility].]  Pharmacy Interventions   Pharmacy Dicussed/Reviewed Pharmacy Topics Reviewed  [medications reviewed and compliance discussed.]               If you are experiencing a Mental Health or Behavioral Health Crisis or need someone to talk to, please call the Suicide and Crisis Lifeline: 988 call 1-800-273-TALK (toll free, 24 hour hotline)  Patient verbalizes understanding of instructions and care plan provided today and agrees to view in MyChart. Active  MyChart status and patient understanding of how to access instructions and care plan via MyChart confirmed with patient.     Verba Girt RN,BSN,CCM [place]  [facility], [facility] coordinator / Case Manager Phone: [phone]

## 2023-03-17 NOTE — Patient Outreach (Signed)
  Care Coordination   03/17/2023 Name: Katherine Olson MRN: [id number] DOB: 12/01/1926   Care Coordination Outreach Attempts:  An unsuccessful outreach was attempted for an appointment today. HIPAA compliant messages left on both home and mobile numbers with return call phone number.   Follow Up Plan:  Additional outreach attempts will be made to offer the patient complex care management information and services.   Encounter Outcome:  No Answer   Care Coordination Interventions:  No, not indicated    Verba Girt RN,BSN,CCM [facility]  [facility], [facility] coordinator / Case Manager Phone: [phone]

## 2023-03-18 DIAGNOSIS — I10 Essential (primary) hypertension: Secondary | ICD-10-CM | POA: Diagnosis not present

## 2023-03-18 DIAGNOSIS — G309 Alzheimer's disease, unspecified: Secondary | ICD-10-CM | POA: Diagnosis not present

## 2023-03-18 DIAGNOSIS — Z792 Long term (current) use of antibiotics: Secondary | ICD-10-CM | POA: Diagnosis not present

## 2023-03-18 DIAGNOSIS — M6289 Other specified disorders of muscle: Secondary | ICD-10-CM | POA: Diagnosis not present

## 2023-03-18 DIAGNOSIS — F0280 Dementia in other diseases classified elsewhere without behavioral disturbance: Secondary | ICD-10-CM | POA: Diagnosis not present

## 2023-03-18 DIAGNOSIS — N390 Urinary tract infection, site not specified: Secondary | ICD-10-CM | POA: Diagnosis not present

## 2023-03-19 ENCOUNTER — Telehealth: Payer: Self-pay | Admitting: Family

## 2023-03-22 NOTE — Telephone Encounter (Signed)
close

## 2023-03-24 DIAGNOSIS — Z792 Long term (current) use of antibiotics: Secondary | ICD-10-CM | POA: Diagnosis not present

## 2023-03-24 DIAGNOSIS — I10 Essential (primary) hypertension: Secondary | ICD-10-CM | POA: Diagnosis not present

## 2023-03-24 DIAGNOSIS — G309 Alzheimer's disease, unspecified: Secondary | ICD-10-CM | POA: Diagnosis not present

## 2023-03-24 DIAGNOSIS — M6289 Other specified disorders of muscle: Secondary | ICD-10-CM | POA: Diagnosis not present

## 2023-03-24 DIAGNOSIS — N390 Urinary tract infection, site not specified: Secondary | ICD-10-CM | POA: Diagnosis not present

## 2023-03-24 DIAGNOSIS — F0280 Dementia in other diseases classified elsewhere without behavioral disturbance: Secondary | ICD-10-CM | POA: Diagnosis not present

## 2023-03-25 DIAGNOSIS — F0280 Dementia in other diseases classified elsewhere without behavioral disturbance: Secondary | ICD-10-CM | POA: Diagnosis not present

## 2023-03-25 DIAGNOSIS — G309 Alzheimer's disease, unspecified: Secondary | ICD-10-CM | POA: Diagnosis not present

## 2023-03-25 DIAGNOSIS — M6289 Other specified disorders of muscle: Secondary | ICD-10-CM | POA: Diagnosis not present

## 2023-03-25 DIAGNOSIS — I10 Essential (primary) hypertension: Secondary | ICD-10-CM | POA: Diagnosis not present

## 2023-03-25 DIAGNOSIS — N390 Urinary tract infection, site not specified: Secondary | ICD-10-CM | POA: Diagnosis not present

## 2023-03-25 DIAGNOSIS — Z792 Long term (current) use of antibiotics: Secondary | ICD-10-CM | POA: Diagnosis not present

## 2023-03-30 DIAGNOSIS — Z792 Long term (current) use of antibiotics: Secondary | ICD-10-CM | POA: Diagnosis not present

## 2023-03-30 DIAGNOSIS — F0280 Dementia in other diseases classified elsewhere without behavioral disturbance: Secondary | ICD-10-CM | POA: Diagnosis not present

## 2023-03-30 DIAGNOSIS — N390 Urinary tract infection, site not specified: Secondary | ICD-10-CM | POA: Diagnosis not present

## 2023-03-30 DIAGNOSIS — G309 Alzheimer's disease, unspecified: Secondary | ICD-10-CM | POA: Diagnosis not present

## 2023-03-30 DIAGNOSIS — M6289 Other specified disorders of muscle: Secondary | ICD-10-CM | POA: Diagnosis not present

## 2023-03-30 DIAGNOSIS — I10 Essential (primary) hypertension: Secondary | ICD-10-CM | POA: Diagnosis not present

## 2023-03-31 ENCOUNTER — Other Ambulatory Visit: Payer: Self-pay | Admitting: Family

## 2023-03-31 DIAGNOSIS — I10 Essential (primary) hypertension: Secondary | ICD-10-CM

## 2023-04-01 ENCOUNTER — Inpatient Hospital Stay
Admission: EM | Admit: 2023-04-01 | Discharge: 2023-04-04 | DRG: 378 | Disposition: A | Discharge status: Home Health | Payer: Medicare HMO | Attending: Internal Medicine | Admitting: Internal Medicine

## 2023-04-01 ENCOUNTER — Other Ambulatory Visit: Payer: Self-pay

## 2023-04-01 DIAGNOSIS — N2889 Other specified disorders of kidney and ureter: Secondary | ICD-10-CM | POA: Diagnosis not present

## 2023-04-01 DIAGNOSIS — K5731 Diverticulosis of large intestine without perforation or abscess with bleeding: Secondary | ICD-10-CM | POA: Diagnosis not present

## 2023-04-01 DIAGNOSIS — I10 Essential (primary) hypertension: Secondary | ICD-10-CM | POA: Diagnosis not present

## 2023-04-01 DIAGNOSIS — I129 Hypertensive chronic kidney disease with stage 1 through stage 4 chronic kidney disease, or unspecified chronic kidney disease: Secondary | ICD-10-CM | POA: Diagnosis present

## 2023-04-01 DIAGNOSIS — R710 Precipitous drop in hematocrit: Secondary | ICD-10-CM | POA: Diagnosis not present

## 2023-04-01 DIAGNOSIS — Z820 Family history of epilepsy and other diseases of the nervous system: Secondary | ICD-10-CM

## 2023-04-01 DIAGNOSIS — D62 Acute posthemorrhagic anemia: Secondary | ICD-10-CM | POA: Diagnosis not present

## 2023-04-01 DIAGNOSIS — Z79899 Other long term (current) drug therapy: Secondary | ICD-10-CM

## 2023-04-01 DIAGNOSIS — K429 Umbilical hernia without obstruction or gangrene: Secondary | ICD-10-CM | POA: Diagnosis not present

## 2023-04-01 DIAGNOSIS — N1831 Chronic kidney disease, stage 3a: Secondary | ICD-10-CM | POA: Diagnosis present

## 2023-04-01 DIAGNOSIS — Z8249 Family history of ischemic heart disease and other diseases of the circulatory system: Secondary | ICD-10-CM

## 2023-04-01 DIAGNOSIS — E785 Hyperlipidemia, unspecified: Secondary | ICD-10-CM | POA: Diagnosis not present

## 2023-04-01 DIAGNOSIS — F0283 Dementia in other diseases classified elsewhere, unspecified severity, with mood disturbance: Secondary | ICD-10-CM | POA: Diagnosis present

## 2023-04-01 DIAGNOSIS — K922 Gastrointestinal hemorrhage, unspecified: Principal | ICD-10-CM | POA: Diagnosis present

## 2023-04-01 DIAGNOSIS — F0280 Dementia in other diseases classified elsewhere without behavioral disturbance: Secondary | ICD-10-CM | POA: Diagnosis present

## 2023-04-01 DIAGNOSIS — K625 Hemorrhage of anus and rectum: Secondary | ICD-10-CM | POA: Diagnosis not present

## 2023-04-01 DIAGNOSIS — F329 Major depressive disorder, single episode, unspecified: Secondary | ICD-10-CM | POA: Diagnosis not present

## 2023-04-01 DIAGNOSIS — N281 Cyst of kidney, acquired: Secondary | ICD-10-CM | POA: Diagnosis not present

## 2023-04-01 DIAGNOSIS — Z87440 Personal history of urinary (tract) infections: Secondary | ICD-10-CM

## 2023-04-01 DIAGNOSIS — Z7401 Bed confinement status: Secondary | ICD-10-CM

## 2023-04-01 DIAGNOSIS — Z888 Allergy status to other drugs, medicaments and biological substances status: Secondary | ICD-10-CM

## 2023-04-01 DIAGNOSIS — Z792 Long term (current) use of antibiotics: Secondary | ICD-10-CM | POA: Diagnosis not present

## 2023-04-01 DIAGNOSIS — Z96641 Presence of right artificial hip joint: Secondary | ICD-10-CM | POA: Diagnosis present

## 2023-04-01 DIAGNOSIS — Z90710 Acquired absence of both cervix and uterus: Secondary | ICD-10-CM | POA: Diagnosis not present

## 2023-04-01 DIAGNOSIS — K5730 Diverticulosis of large intestine without perforation or abscess without bleeding: Secondary | ICD-10-CM | POA: Diagnosis not present

## 2023-04-01 DIAGNOSIS — K921 Melena: Secondary | ICD-10-CM | POA: Diagnosis not present

## 2023-04-01 DIAGNOSIS — I959 Hypotension, unspecified: Secondary | ICD-10-CM | POA: Diagnosis present

## 2023-04-01 DIAGNOSIS — N390 Urinary tract infection, site not specified: Secondary | ICD-10-CM | POA: Diagnosis not present

## 2023-04-01 DIAGNOSIS — M6289 Other specified disorders of muscle: Secondary | ICD-10-CM | POA: Diagnosis not present

## 2023-04-01 DIAGNOSIS — R58 Hemorrhage, not elsewhere classified: Secondary | ICD-10-CM | POA: Diagnosis not present

## 2023-04-01 DIAGNOSIS — Z66 Do not resuscitate: Secondary | ICD-10-CM | POA: Diagnosis not present

## 2023-04-01 DIAGNOSIS — G309 Alzheimer's disease, unspecified: Secondary | ICD-10-CM | POA: Diagnosis present

## 2023-04-01 DIAGNOSIS — H9190 Unspecified hearing loss, unspecified ear: Secondary | ICD-10-CM | POA: Diagnosis present

## 2023-04-01 LAB — COMPREHENSIVE METABOLIC PANEL
ALT: 10 U/L (ref 0–44)
AST: 15 U/L (ref 15–41)
Albumin: 3.5 g/dL (ref 3.5–5.0)
Alkaline Phosphatase: 50 U/L (ref 38–126)
Anion gap: 11 (ref 5–15)
BUN: 14 mg/dL (ref 8–23)
CO2: 21 mmol/L — ABNORMAL LOW (ref 22–32)
Calcium: 9.8 mg/dL (ref 8.9–10.3)
Chloride: 103 mmol/L (ref 98–111)
Creatinine, Ser: 1 mg/dL (ref 0.44–1.00)
GFR, Estimated: 52 mL/min — ABNORMAL LOW (ref >60)
Glucose, Bld: 96 mg/dL (ref 70–99)
Potassium: 4 mmol/L (ref 3.5–5.1)
Sodium: 135 mmol/L (ref 135–145)
Total Bilirubin: 0.4 mg/dL (ref 0.0–1.2)
Total Protein: 7.1 g/dL (ref 6.5–8.1)

## 2023-04-01 LAB — CBC
HCT: 34.1 % — ABNORMAL LOW (ref 36.0–46.0)
Hemoglobin: 11.6 g/dL — ABNORMAL LOW (ref 12.0–15.0)
MCH: 29.9 pg (ref 26.0–34.0)
MCHC: 34 g/dL (ref 30.0–36.0)
MCV: 87.9 fL (ref 80.0–100.0)
Platelets: 280 10*3/uL (ref 150–400)
RBC: 3.88 MIL/uL (ref 3.87–5.11)
RDW: 13.2 % (ref 11.5–15.5)
WBC: 10.7 10*3/uL — ABNORMAL HIGH (ref 4.0–10.5)
nRBC: 0 % (ref 0.0–0.2)

## 2023-04-01 LAB — HEMOGLOBIN: Hemoglobin: 11.1 g/dL — ABNORMAL LOW (ref 12.0–15.0)

## 2023-04-01 LAB — PROTIME-INR
INR: 1.1 (ref 0.8–1.2)
Prothrombin Time: 14.6 s (ref 11.4–15.2)

## 2023-04-01 MED ORDER — DEXTROSE-SODIUM CHLORIDE 5-0.45 % IV SOLN: INTRAVENOUS | Status: AC

## 2023-04-01 MED ORDER — CITALOPRAM HYDROBROMIDE 20 MG PO TABS
20.0000 mg | ORAL_TABLET | Freq: Every day | ORAL | Status: DC
  Administered 2023-04-02 – 2023-04-04 (×3): 20 mg via ORAL
  Filled 2023-04-01 (×3): qty 1

## 2023-04-01 MED ORDER — PANTOPRAZOLE SODIUM 40 MG IV SOLR
40.0000 mg | Freq: Two times a day (BID) | INTRAVENOUS | Status: DC
Start: 2023-04-01 — End: 2023-04-04
  Administered 2023-04-01 – 2023-04-03 (×5): 40 mg via INTRAVENOUS
  Filled 2023-04-01 (×6): qty 10

## 2023-04-01 NOTE — ED Triage Notes (Signed)
Arrives from home via [facility]  family noticed some bright red blood when changing her this morning. Has hx of dementia.  VS wnl.

## 2023-04-01 NOTE — ED Notes (Signed)
Per triage ED tech John's report, Patient had brief changed while waiting in the lobby. Brief was dry and no blood was noted.

## 2023-04-01 NOTE — ED Notes (Signed)
Patient was a full assist from wheelchair to stretcher. Son is at bedside.

## 2023-04-01 NOTE — ED Triage Notes (Signed)
Son reports one episode of dark blood in the stool states has never had this before, unaware if she's ever been diagnosed with diverticulitis, denies any pain or distress

## 2023-04-01 NOTE — ED Notes (Signed)
Son left to go to the cafeteria and requested that the IV insertion wait until he got back. Patient is resting on her left side in a darkened room. Patient was redirected about where she was.

## 2023-04-01 NOTE — ED Notes (Signed)
Report given to [facility]

## 2023-04-01 NOTE — H&P (Addendum)
History and Physical    Katherine Olson ZOX:[id number] DOB: [birth year] DOA: 04/01/2023  PCP: System, Provider Not In  Patient coming from: home   Chief Complaint: hematochezia  HPI: Katherine Olson is a 89 y.o. female with medical history significant for advanced alzheimer's dementia, htn, who presents with the above.  Son cares for her, she is mostly bedbound but can ambulate some with a walker. Normal BMs, brown, no nsaids. No current or recent abdominal pain, no fevers, no diarrhea or nausea/vomiting. Today had bm that was mixed with dark blood. Maroon-blood on dre per EDP. No additional BMs. No history of gi bleed. Had diverticulosis on last colonoscopy which was in 2015. Had an ercp in 2019 at [facility], can't find that report but d/c summary doesn't identify any gastric pathology (ercp was apparently for gallstone pancreatitis).    Review of Systems: As per HPI otherwise 10 point review of systems negative.    Past Medical History:  Diagnosis Date   Arthritis    Dementia (HCC)    Depression    Hyperlipidemia    Hypertension     Past Surgical History:  Procedure Laterality Date   ABDOMINAL HYSTERECTOMY     partial   JOINT REPLACEMENT Right 1991   hip     reports that she has never smoked. She has never used smokeless tobacco. She reports that she does not drink alcohol and does not use drugs.  Allergies  Allergen Reactions   Simvastatin Other (See Comments)    Stiffness in joints Onset 07/24/2003.    Family History  Problem Relation Age of Onset   Alzheimer's disease Brother    Heart disease Sister    Cancer Sister        kidney   Cancer Mother        liver   Cancer Father        prostate    Prior to Admission medications   Medication Sig Start Date End Date Taking? Authorizing Provider  amLODipine (NORVASC) 5 MG tablet Take 1 tablet (5 mg total) by mouth every morning. 08/26/22   Allegra Grana, FNP  citalopram (CELEXA) 20 MG tablet TAKE 1 TABLET(20 MG) BY  MOUTH EVERY MORNING 02/05/23   Allegra Grana, FNP  Coenzyme Q-10 200 MG CAPS Take by mouth.    [provider]  conjugated estrogens (PREMARIN) vaginal cream 1 applicatorful vaginally 1-2 times per week. Patient not taking: Reported on 03/17/2023 05/01/22   Allegra Grana, FNP  losartan (COZAAR) 50 MG tablet Take 1 tablet (50 mg total) by mouth daily. 08/26/22   Allegra Grana, FNP  Multiple Vitamins-Minerals (MULTIVITAMIN WITH MINERALS) tablet Take 1 tablet by mouth daily.    [provider]  nitrofurantoin, macrocrystal-monohydrate, (MACROBID) 100 MG capsule Take 1 capsule (100 mg total) by mouth 2 (two) times daily. 02/19/23   Glori Luis, MD  Omega-3 Fatty Acids (FISH OIL PO) Take by mouth.    [provider]  ondansetron (ZOFRAN-ODT) 4 MG disintegrating tablet Take 1 tablet (4 mg total) by mouth every 8 (eight) hours as needed for nausea or vomiting. Patient not taking: Reported on 02/03/2023 11/09/22   Valinda Hoar, NP  pravastatin (PRAVACHOL) 20 MG tablet Take 1 tablet (20 mg total) by mouth daily. 10/26/22   Allegra Grana, FNP  sulfamethoxazole-trimethoprim (BACTRIM DS) 800-160 MG tablet Take 0.5 tablets by mouth daily. Patient not taking: Reported on 03/17/2023 01/22/23   Allegra Grana, FNP  terconazole Radene Ou  3) 0.8 % vaginal cream Place 1 applicator vaginally at bedtime. For 3 days 05/06/21   Berniece Pap, FNP    Physical Exam: Vitals:   04/01/23 1210 04/01/23 1211  BP: 129/73   Pulse: 80   Resp: 16   Temp: 97.9 F (36.6 C)   TempSrc: Oral   SpO2: 95%   Weight:  72.6 kg  Height:  5\' 6"  (1.676 m)    Constitutional: No acute distress Head: Atraumatic Eyes: Conjunctiva clear ENM: Moist mucous membranes. Normal dentition.  Neck: Supple Respiratory: Clear to auscultation bilaterally, no wheezing/rales/rhonchi.   Cardiovascular: Regular rate and rhythm. Soft systolic murmur Abdomen: Non-tender, non-distended. No  masses. No rebound or guarding. Positive bowel sounds. Musculoskeletal: No joint deformity upper and lower extremities. Normal ROM, no contractures. Normal muscle tone.  Skin: No rashes, lesions, or ulcers.  Extremities: No peripheral edema. Palpable peripheral pulses. Neurologic: Alert, moving all 4 extremities. Psychiatric: calm, hard of hearing, confused   Labs on Admission: I have personally reviewed following labs and imaging studies  CBC: Recent Labs  Lab 04/01/23 1215  WBC 10.7*  HGB 11.6*  HCT 34.1*  MCV 87.9  PLT 280   Basic Metabolic Panel: Recent Labs  Lab 04/01/23 1215  NA 135  K 4.0  CL 103  CO2 21*  GLUCOSE 96  BUN 14  CREATININE 1.00  CALCIUM 9.8   GFR: Estimated Creatinine Clearance: 35.1 mL/min (by C-G formula based on SCr of 1 mg/dL). Liver Function Tests: Recent Labs  Lab 04/01/23 1215  AST 15  ALT 10  ALKPHOS 50  BILITOT 0.4  PROT 7.1  ALBUMIN 3.5   No results for input(s): "LIPASE", "AMYLASE" in the last 168 hours. No results for input(s): "AMMONIA" in the last 168 hours. Coagulation Profile: No results for input(s): "INR", "PROTIME" in the last 168 hours. Cardiac Enzymes: No results for input(s): "CKTOTAL", "CKMB", "CKMBINDEX", "TROPONINI" in the last 168 hours. BNP (last 3 results) No results for input(s): "PROBNP" in the last 8760 hours. HbA1C: No results for input(s): "HGBA1C" in the last 72 hours. CBG: No results for input(s): "GLUCAP" in the last 168 hours. Lipid Profile: No results for input(s): "CHOL", "HDL", "LDLCALC", "TRIG", "CHOLHDL", "LDLDIRECT" in the last 72 hours. Thyroid Function Tests: No results for input(s): "TSH", "T4TOTAL", "FREET4", "T3FREE", "THYROIDAB" in the last 72 hours. Anemia Panel: No results for input(s): "VITAMINB12", "FOLATE", "FERRITIN", "TIBC", "IRON", "RETICCTPCT" in the last 72 hours. Urine analysis:    Component Value Date/Time   COLORURINE YELLOW (A) 03/01/2023 0209   APPEARANCEUR CLOUDY  (A) 03/01/2023 0209   APPEARANCEUR Clear 05/05/2021 1656   LABSPEC 1.012 03/01/2023 0209   PHURINE 7.0 03/01/2023 0209   GLUCOSEU NEGATIVE 03/01/2023 0209   GLUCOSEU NEGATIVE 01/19/2023 1203   HGBUR SMALL (A) 03/01/2023 0209   BILIRUBINUR NEGATIVE 03/01/2023 0209   BILIRUBINUR negative 11/09/2022 1314   BILIRUBINUR Negative 05/05/2021 1656   KETONESUR NEGATIVE 03/01/2023 0209   PROTEINUR NEGATIVE 03/01/2023 0209   UROBILINOGEN 0.2 01/19/2023 1203   NITRITE NEGATIVE 03/01/2023 0209   LEUKOCYTESUR TRACE (A) 03/01/2023 0209    Radiological Exams on Admission: No results found.   Assessment/Plan Principal Problem:   Lower GI bleed Active Problems:   Alzheimer's disease (HCC)   Essential hypertension   CKD stage 3a, GFR 45-59 ml/min (HCC)   # Lower GI bleed One day, no history of. Painless, diverticulosis on 2015 colonoscopy so that would probably be leading diagnosis. Hemodynamicallys table, baseline hgb is 12s, here 11.6. denies  nsaids - trend hgb q6 - start ppi though relatively low concern for upper gi bleed at the moment - f/u inr - gi consulted - gentle fluids - would perform CT angio if significant bleeding overnight, hemodynamic instability, etc. - clears  # HTN Here normotensive - hold home meds in setting of bleeding  # CKD 3a Kidney function at baseline - monitor  # Complex biliary history [facility] record lists history several stents. No abd pain, LFTs normal - monitor  # Alzheimer dementia Without behavioral disturbance - delirium precautions  # Recurrent UTIs Asymptomatic at the present  # MDD - home celexa  DVT prophylaxis: SCDs Code Status: DNR/DNI, confirmed w/ son  Family Communication: son at bedside  Consults called: GI   Level of care: Med-Surg Status is: Inpatient Remains inpatient appropriate because: severity of illness    Silvano Bilis MD [facility] Pager [phone]  If 7PM-7AM, please contact  night-coverage [email or web address] Password [facility]  04/01/2023, 4:28 PM

## 2023-04-01 NOTE — Progress Notes (Signed)
[facility] ED [address] [facility] Liaison Note  This patient is active with [employer], contracting [facility] program.   A provider can follow up tomorrow and again next week, in the patients home with lab work or any other needs, if the patient is not admitted to the hospital.  Please call with any questions or concerns.  Thank you, Haynes Bast, BSN, [facility] Liaison [phone]

## 2023-04-01 NOTE — ED Provider Triage Note (Signed)
Emergency Medicine Provider Triage Evaluation Note  Katherine Olson ,  a 89 y.o. female  was evaluated in triage.  Pt complains of dark blood in stool per family member. Family states that they saw bright red blood while changing her this am.   Pt with dementia.    Review of Systems  Positive:  Blood + Negative:   Physical Exam  BP 129/73 (BP Location: Right Arm)   Pulse 80   Temp 97.9 F (36.6 C) (Oral)   Resp 16   Ht 5\' 6"  (1.676 m)   Wt 72.6 kg   SpO2 95%   BMI 25.82 kg/m  Gen:   Awake, no distress    HOH Resp:  Normal effort  Lungs clear MSK:   Moves extremities without difficulty  Other:    Medical Decision Making  Medically screening exam initiated at 12:12 PM.  Appropriate orders placed.  Katherine Olson was informed that the remainder of the evaluation will be completed by another provider, this initial triage assessment does not replace that evaluation, and the importance of remaining in the ED until their evaluation is complete.     Tommi Rumps, PA-C 04/01/23 1215

## 2023-04-01 NOTE — ED Provider Notes (Signed)
[facility] Provider Note    Event Date/Time   First MD Initiated Contact with Patient 04/01/23 1505     (approximate)   History   GI Bleeding   HPI  Katherine Olson is a 89 y.o. female with dementia, no blood thinners who comes in with concerns for GI bleeding.  Patient had an episode of maroon stool starting today.  Denies ever having this previously.  She denies any pain or any other concerns.  Reviewed prior colonoscopy from 2015 where patient had diverticulum.  Son is at bedside who does want full scope of care including blood transfusions, colonoscopy if deemed to help the bleeding.  Physical Exam   Triage Vital Signs: ED Triage Vitals  Encounter Vitals Group     BP 04/01/23 1210 129/73     Systolic BP Percentile --      Diastolic BP Percentile --      Pulse Rate 04/01/23 1210 80     Resp 04/01/23 1210 16     Temp 04/01/23 1210 97.9 F (36.6 C)     Temp Source 04/01/23 1210 Oral     SpO2 04/01/23 1210 95 %     Weight 04/01/23 1211 160 lb (72.6 kg)     Height 04/01/23 1211 5\' 6"  (1.676 m)     Head Circumference --      Peak Flow --      Pain Score 04/01/23 1211 0     Pain Loc --      Pain Education --      Exclude from Growth Chart --     Most recent vital signs: Vitals:   04/01/23 1210  BP: 129/73  Pulse: 80  Resp: 16  Temp: 97.9 F (36.6 C)  SpO2: 95%     General: Awake, no distress.  CV:  Good peripheral perfusion.  Resp:  Normal effort.  Abd:  No distention.  Other:  Maroon noted blood on rectal exam.   ED Results / Procedures / Treatments   Labs (all labs ordered are listed, but only abnormal results are displayed) Labs Reviewed  COMPREHENSIVE METABOLIC PANEL - Abnormal; Notable for the following components:      Result Value   CO2 21 (*)    GFR, Estimated 52 (*)    All other components within normal limits  CBC - Abnormal; Notable for the following components:   WBC 10.7 (*)    Hemoglobin 11.6 (*)    HCT  34.1 (*)    All other components within normal limits  POC OCCULT BLOOD, ED  TYPE AND SCREEN     PROCEDURES:  Critical Care performed: No  Procedures   MEDICATIONS ORDERED IN ED: Medications - No data to display   IMPRESSION / MDM / ASSESSMENT AND PLAN / ED COURSE  I reviewed the triage vital signs and the nursing notes.   Patient's presentation is most consistent with acute presentation with potential threat to life or bodily function.   Differential is lower GI bleed most likely from diverticulum versus colon cancer.  Lower suspicion for upper GI bleed hemodynamically stable.  Positive rectal exam  Cbc hemoglobin of 11.6 CMP normal   Given patient's age discussed considering repeat hemoglobins and going home versus coming to the hospital.  They are full scope of care and would like to be admitted for further workup    FINAL CLINICAL IMPRESSION(S) / ED DIAGNOSES   Final diagnoses:  Gastrointestinal hemorrhage, unspecified gastrointestinal hemorrhage type  Rx / DC Orders   ED Discharge Orders     None        Note:  This document was prepared using Dragon voice recognition software and may include unintentional dictation errors.   Concha Se, MD 04/01/23 [phone]

## 2023-04-02 ENCOUNTER — Inpatient Hospital Stay: Payer: Medicare HMO

## 2023-04-02 ENCOUNTER — Encounter: Payer: Self-pay | Admitting: Obstetrics and Gynecology

## 2023-04-02 DIAGNOSIS — K922 Gastrointestinal hemorrhage, unspecified: Secondary | ICD-10-CM | POA: Diagnosis not present

## 2023-04-02 DIAGNOSIS — K625 Hemorrhage of anus and rectum: Secondary | ICD-10-CM | POA: Diagnosis not present

## 2023-04-02 DIAGNOSIS — R710 Precipitous drop in hematocrit: Secondary | ICD-10-CM | POA: Diagnosis not present

## 2023-04-02 LAB — FIBRINOGEN: Fibrinogen: 476 mg/dL — ABNORMAL HIGH (ref 210–475)

## 2023-04-02 LAB — ABO/RH: ABO/RH(D): B POS

## 2023-04-02 LAB — CBC
HCT: 26.6 % — ABNORMAL LOW (ref 36.0–46.0)
Hemoglobin: 9.2 g/dL — ABNORMAL LOW (ref 12.0–15.0)
MCH: 30.6 pg (ref 26.0–34.0)
MCHC: 34.6 g/dL (ref 30.0–36.0)
MCV: 88.4 fL (ref 80.0–100.0)
Platelets: 229 10*3/uL (ref 150–400)
RBC: 3.01 MIL/uL — ABNORMAL LOW (ref 3.87–5.11)
RDW: 13.2 % (ref 11.5–15.5)
WBC: 11.7 10*3/uL — ABNORMAL HIGH (ref 4.0–10.5)
nRBC: 0 % (ref 0.0–0.2)

## 2023-04-02 LAB — GLUCOSE, CAPILLARY: Glucose-Capillary: 102 mg/dL — ABNORMAL HIGH (ref 70–99)

## 2023-04-02 LAB — HEMOGLOBIN
Hemoglobin: 10 g/dL — ABNORMAL LOW (ref 12.0–15.0)
Hemoglobin: 10.3 g/dL — ABNORMAL LOW (ref 12.0–15.0)
Hemoglobin: 9 g/dL — ABNORMAL LOW (ref 12.0–15.0)
Hemoglobin: 9.1 g/dL — ABNORMAL LOW (ref 12.0–15.0)

## 2023-04-02 LAB — PROTIME-INR
INR: 1.2 (ref 0.8–1.2)
Prothrombin Time: 15 s (ref 11.4–15.2)

## 2023-04-02 LAB — PREPARE RBC (CROSSMATCH)

## 2023-04-02 MED ORDER — SODIUM CHLORIDE 0.9 % IV BOLUS
500.0000 mL | Freq: Once | INTRAVENOUS | Status: AC
  Administered 2023-04-02: 500 mL via INTRAVENOUS

## 2023-04-02 MED ORDER — ONDANSETRON HCL 4 MG/2ML IJ SOLN
4.0000 mg | Freq: Four times a day (QID) | INTRAMUSCULAR | Status: DC | PRN
  Administered 2023-04-02: 4 mg via INTRAVENOUS
  Filled 2023-04-02: qty 2

## 2023-04-02 MED ORDER — TECHNETIUM TC 99M-LABELED RED BLOOD CELLS IV KIT
20.0000 | PACK | Freq: Once | INTRAVENOUS | Status: AC | PRN
  Administered 2023-04-02: 22.47 via INTRAVENOUS

## 2023-04-02 MED ORDER — SODIUM CHLORIDE 0.9% IV SOLUTION: Freq: Once | INTRAVENOUS | Status: AC

## 2023-04-02 MED ORDER — IOHEXOL 350 MG/ML SOLN
100.0000 mL | Freq: Once | INTRAVENOUS | Status: AC | PRN
  Administered 2023-04-02: 100 mL via INTRAVENOUS

## 2023-04-02 MED ORDER — SODIUM CHLORIDE 0.9 % IV BOLUS
250.0000 mL | Freq: Once | INTRAVENOUS | Status: AC
  Administered 2023-04-02: 250 mL via INTRAVENOUS

## 2023-04-02 NOTE — ED Notes (Addendum)
Pt passes dark red stool w/ clots, incontinence care provided. Pt lying on side, brief placed, VSS, pt alert.

## 2023-04-02 NOTE — Progress Notes (Signed)
       CROSS COVER NOTE  NAME: KARN DERK MRN: [id number] DOB : [birth year] ATTENDING PHYSICIAN: Kathrynn Running, MD    Date of Service   04/02/2023   HPI/Events of Note   Notified by nurse patient visitor demanding to see a GI doc and patient now passing blood via rectum dark red with clots  Interventions   Assessment/Plan:    04/02/2023   12:00 AM 04/01/2023   10:30 PM 04/01/2023    8:12 PM  Vitals with BMI  Systolic 122 118 562  Diastolic 73 61 49  Pulse 75 69 68   Hgb stable at 10 On arriveal to ED patietns son with security member in hall being very loud. And demanding. I introduced myself and patient started demanding what am I going to do for his mom. Tried to explain several ways regarding the GI bleed, her stability and plan for Cta GI bleed studay and next steps. Then he was demanding to know when she was going to go to CT and upset that i could not tell him Then he said well are you going to see my mom, at this point patient is in bed in hall be transferred to inpatient room. When I tried to explain to patient her GI bleed ant plan for CT scan, her son would not let me. He just told his mom that she was not bleeding and not to worry about it CTA GI bleed ordered stat Serial hgb already ordered Security aware of situation       Donnie Mesa NP [facility] Cross Cover 7pm-7am - check amion for availability Pager [phone]

## 2023-04-02 NOTE — ED Notes (Addendum)
Pt visitor at this time expressing concerns about pt dark red stool. Pt found to have small amounts of dark red stool continuously oozing from rectum. Questions answered and verbal comfort provided. Pt visitor approaching RN desk demanding to see a provider, covering provider notified. Pt VSS.

## 2023-04-02 NOTE — ED Notes (Addendum)
Pt visitor at this time pacing back and forth in hallway, demanding to see a GI doctor. Pt visitor concerns acknowledged. Pt visitor asking questions previously acknowledged and repeatedly shouting "where is the GI doctor?". Pt visitor repeatedly updated on care and notified of plan of care w/ plan of CT imaging. No evidence of learning noted. Consulting civil engineer and security called to bedside for pt visitor deescalation. Covering provider at bedside to provide update.

## 2023-04-02 NOTE — ED Notes (Addendum)
Pt found to have been in flex and began asking previous RN in regards to updates, pt visitor returned to ED45 and was told was unable to visit other areas of ED. Pt visitor questions acknowledged and answered.

## 2023-04-02 NOTE — Progress Notes (Signed)
   04/02/23 1500  Spiritual Encounters  Type of Visit Initial  Care provided to: [facility] partners present during encounter Nurse  Referral source Family  Reason for visit Routine spiritual support  OnCall Visit No  Spiritual Framework  Presenting Themes Meaning/purpose/sources of inspiration;Goals in life/care;Rituals and practive;Courage hope and growth   Secretary asked chaplain to go and talk to a patient's son that has been having a hard time with his mom being in the hospital. Chaplain listened to his concerns and offered him words of hope and encouragement.

## 2023-04-02 NOTE — Progress Notes (Addendum)
PROGRESS NOTE    Katherine Olson  ZOX:[id number] DOB: [birth year] DOA: 04/01/2023 PCP: System, Provider Not In     Brief Narrative:   Katherine Olson is a 89 y.o. female with medical history significant for advanced alzheimer's dementia, htn, who presents with the above.   Son cares for her, she is mostly bedbound but can ambulate some with a walker. Normal BMs, brown, no nsaids. No current or recent abdominal pain, no fevers, no diarrhea or nausea/vomiting. Today had bm that was mixed with dark blood. Maroon-blood on dre per EDP. No additional BMs. No history of gi bleed. Had diverticulosis on last colonoscopy which was in 2015. Had an ercp in 2019 at [facility], can't find that report but d/c summary doesn't identify any gastric pathology (ercp was apparently for gallstone pancreatitis).   Assessment & Plan:   Principal Problem:   Lower GI bleed Active Problems:   Alzheimer's disease (HCC)   Essential hypertension   CKD stage 3a, GFR 45-59 ml/min (HCC)   # GI bleed, likely lower Began day prior to admission, no history of. Painless, diverticulosis on 2015 colonoscopy so that would probably be leading diagnosis. Baseline of 12s, 11s on presentation, overnight several bloody BMs and today hgb has drifted to 9s, now with hypotension. No nsaids. CT angio overnight failed to show active bleeding. - trend hgb q6 - cont ppi though relatively low concern for upper gi bleed at the moment - gi consulted - fluid bolus and 1 unit prbc given hypotension  # Right kidney mass Incidental, seen on CT, concerning for malignancy - will need outpt urology f/u if consistent w/ goals of care   # HTN Here normotensive - hold home meds in setting of bleeding   # CKD 3a Kidney function at baseline - monitor   # Complex biliary history [facility] record lists history several stents. No abd pain, LFTs normal - monitor   # Alzheimer dementia Without behavioral disturbance - delirium precautions   # Recurrent  UTIs Asymptomatic at the present   # MDD - home celexa   DVT prophylaxis: SCDs Code Status: DNR/DNI, confirmed w/ son  Family Communication:  son updated extensively by GI on 1/31 Consults called: GI    Level of care: Med-Surg Status is: Inpatient Remains inpatient appropriate because: severity of illness    Consultants:  GI  Procedures: None thus far  Antimicrobials:  none    Subjective: Denies pain, no vomiting, had several bloody bms overnight per report  Objective: Vitals:   04/02/23 0000 04/02/23 0120 04/02/23 0423 04/02/23 0439  BP: 122/73 129/64 (!) 81/44 (!) 86/49  Pulse: 75 65 91 96  Resp: 19 18 20 20   Temp:  99 F (37.2 C) 97.9 F (36.6 C)   TempSrc:   Oral   SpO2: 97% 96% 99%   Weight:      Height:       No intake or output data in the 24 hours ending 04/02/23 0833 Filed Weights   04/01/23 1211  Weight: 72.6 kg    Examination:  General exam: Appears calm and comfortable  Respiratory system: Clear to auscultation. Respiratory effort normal. Cardiovascular system: S1 & S2 heard, RRR. Soft systolic murmur Gastrointestinal system: Abdomen is nondistended, soft and nontender. No organomegaly or masses felt. Normal bowel sounds heard. Central nervous system: Alert and oriented to self. No focal neurological deficits. Extremities: Symmetric 5 x 5 power. Skin: No rashes, lesions or ulcers Psychiatry: pleasantly confused, calm    Data  Reviewed: I have personally reviewed following labs and imaging studies  CBC: Recent Labs  Lab 04/01/23 1215 04/01/23 1819 04/02/23 0012 04/02/23 0514 04/02/23 0754  WBC 10.7*  --   --   --  11.7*  HGB 11.6* 11.1* 10.0* 9.1* 9.2*  HCT 34.1*  --   --   --  26.6*  MCV 87.9  --   --   --  88.4  PLT 280  --   --   --  229   Basic Metabolic Panel: Recent Labs  Lab 04/01/23 1215  NA 135  K 4.0  CL 103  CO2 21*  GLUCOSE 96  BUN 14  CREATININE 1.00  CALCIUM 9.8   GFR: Estimated Creatinine Clearance:  35.1 mL/min (by C-G formula based on SCr of 1 mg/dL). Liver Function Tests: Recent Labs  Lab 04/01/23 1215  AST 15  ALT 10  ALKPHOS 50  BILITOT 0.4  PROT 7.1  ALBUMIN 3.5   No results for input(s): "LIPASE", "AMYLASE" in the last 168 hours. No results for input(s): "AMMONIA" in the last 168 hours. Coagulation Profile: Recent Labs  Lab 04/01/23 1819  INR 1.1   Cardiac Enzymes: No results for input(s): "CKTOTAL", "CKMB", "CKMBINDEX", "TROPONINI" in the last 168 hours. BNP (last 3 results) No results for input(s): "PROBNP" in the last 8760 hours. HbA1C: No results for input(s): "HGBA1C" in the last 72 hours. CBG: No results for input(s): "GLUCAP" in the last 168 hours. Lipid Profile: No results for input(s): "CHOL", "HDL", "LDLCALC", "TRIG", "CHOLHDL", "LDLDIRECT" in the last 72 hours. Thyroid Function Tests: No results for input(s): "TSH", "T4TOTAL", "FREET4", "T3FREE", "THYROIDAB" in the last 72 hours. Anemia Panel: No results for input(s): "VITAMINB12", "FOLATE", "FERRITIN", "TIBC", "IRON", "RETICCTPCT" in the last 72 hours. Urine analysis:    Component Value Date/Time   COLORURINE YELLOW (A) 03/01/2023 0209   APPEARANCEUR CLOUDY (A) 03/01/2023 0209   APPEARANCEUR Clear 05/05/2021 1656   LABSPEC 1.012 03/01/2023 0209   PHURINE 7.0 03/01/2023 0209   GLUCOSEU NEGATIVE 03/01/2023 0209   GLUCOSEU NEGATIVE 01/19/2023 1203   HGBUR SMALL (A) 03/01/2023 0209   BILIRUBINUR NEGATIVE 03/01/2023 0209   BILIRUBINUR negative 11/09/2022 1314   BILIRUBINUR Negative 05/05/2021 1656   KETONESUR NEGATIVE 03/01/2023 0209   PROTEINUR NEGATIVE 03/01/2023 0209   UROBILINOGEN 0.2 01/19/2023 1203   NITRITE NEGATIVE 03/01/2023 0209   LEUKOCYTESUR TRACE (A) 03/01/2023 0209   Sepsis Labs: @LABRCNTIP (procalcitonin:4,lacticidven:4)  )No results found for this or any previous visit (from the past 240 hours).       Radiology Studies: CT ANGIO GI BLEED Result Date: 04/02/2023 CLINICAL  DATA:  Lower GI bleeding. EXAM: CTA ABDOMEN AND PELVIS WITHOUT AND WITH CONTRAST TECHNIQUE: Multidetector CT imaging of the abdomen and pelvis was performed using the standard protocol during bolus administration of intravenous contrast. Multiplanar reconstructed images and MIPs were obtained and reviewed to evaluate the vascular anatomy. RADIATION DOSE REDUCTION: This exam was performed according to the departmental dose-optimization program which includes automated exposure control, adjustment of the mA and/or kV according to patient size and/or use of iterative reconstruction technique. CONTRAST:  OMNIPAQUE IOHEXOL 350 MG/ML SOLN COMPARISON:  None Available. FINDINGS: VASCULAR Aorta: Normal caliber aorta without aneurysm, dissection, vasculitis or significant stenosis. Moderate severe atherosclerotic calcifications are seen throughout the aorta. Celiac: Patent without evidence of aneurysm, dissection, vasculitis or significant stenosis. SMA: Patent without evidence of aneurysm, dissection, vasculitis or significant stenosis. Renals: Both renal arteries are patent without evidence of aneurysm, dissection, vasculitis, fibromuscular  dysplasia or significant stenosis. IMA: Twos 1 Inflow: Patent without evidence of aneurysm, dissection, vasculitis or significant stenosis. Proximal Outflow: Twos 1 Veins: No obvious venous abnormality within the limitations of this arterial phase study. Review of the MIP images confirms the above findings. NON-VASCULAR Lower chest: There are ground-glass opacities in the lung bases, nonspecific. Hepatobiliary: Gallbladder is small in size versus surgically absent. There is no biliary ductal dilatation. No focal liver lesions are seen. Pancreas: Unremarkable. No pancreatic ductal dilatation or surrounding inflammatory changes. Spleen: Normal in size without focal abnormality. Adrenals/Urinary Tract: Heterogeneous mass is seen in the mid right kidney measuring 3.1 x 3.7 cm. There is  no hydronephrosis or perinephric fat stranding. There are bilateral renal cysts measuring up to 14 mm. The adrenal glands and bladder are within normal limits. Stomach/Bowel: Stomach is within normal limits. Appendix appears normal. No evidence of bowel wall thickening, distention, or inflammatory changes. No active gastrointestinal bleeding identified. There is diffuse colonic diverticulosis. There is a large amount of stool in the rectosigmoid colon. Lymphatic: No enlarged lymph nodes are identified. Reproductive: Status post hysterectomy. No adnexal masses. Other: There is a small fat containing umbilical hernia. There is no ascites. Musculoskeletal: Bones are osteopenic. Degenerative changes affect the spine. There severe degenerative changes of the left hip. Right hip arthroplasty is present. IMPRESSION: 1. No evidence for active gastrointestinal bleeding. Aortic Atherosclerosis (ICD10-I70.0). NON-VASCULAR 1. 3.7 cm heterogeneous mass in the mid right kidney worrisome for renal cell carcinoma. Recommend MRI for further evaluation. 2. Colonic diverticulosis. 3. Large amount of stool in the rectosigmoid colon. 4. Ground-glass opacities in the lung bases, nonspecific. Electronically Signed   By: Darliss Cheney M.D.   On: 04/02/2023 02:16        Scheduled Meds:  sodium chloride   Intravenous Once   citalopram  20 mg Oral Daily   pantoprazole (PROTONIX) IV  40 mg Intravenous Q12H   Continuous Infusions:  dextrose 5 % and 0.45 % NaCl 125 mL/hr at 04/02/23 0754     LOS: 1 day     Silvano Bilis, MD [facility]   If 7PM-7AM, please contact night-coverage [email or web address] Password [facility] 04/02/2023, 8:33 AM

## 2023-04-02 NOTE — Plan of Care (Signed)

## 2023-04-02 NOTE — Consult Note (Signed)
Midge Minium, MD [facility]  [address], [address] [place], [place] [address] Phone: [phone] Fax : [phone]  Consultation  Referring Provider:     Dr. Ashok Pall  Primary Care Physician:  System, Provider Not In Primary Gastroenterologist:  [facility]         Reason for Consultation:     Rectal bleeding  Date of Admission:  04/01/2023 Date of Consultation:  04/02/2023         HPI:   Katherine Olson is a 89 y.o. female who is 89 years old and lives with her son.  The patient has a history of dementia and came with GI bleeding.  The patient's son stated that the patient had maroon stools.  I spoke to him this morning and he denies the patient ever having this in the past.  The patient had a colonoscopy in 2015 by Dr. Bluford Kaufmann that showed diverticulosis.  The patient has a history of a renal lesion that was reported to be 3.1 x 3.7 back in 2019 at [facility].  The patient had a repeat CT scan for CT angiography yesterday that showed the right sided renal lesion to be 3.7 cm.  The CT angiography showed no sign of active GI bleeding.  The patient has had a small amount of rectal bleeding overnight and her hemoglobin had gone from 10 to 9.1. The patient is pleasantly confused but denies any abdominal pain at the present time.  There is also no report of hematemesis.   Past Medical History:  Diagnosis Date   Arthritis    Dementia (HCC)    Depression    Hyperlipidemia    Hypertension     Past Surgical History:  Procedure Laterality Date   ABDOMINAL HYSTERECTOMY     partial   JOINT REPLACEMENT Right 1991   hip    Prior to Admission medications   Medication Sig Start Date End Date Taking? Authorizing Provider  amLODipine (NORVASC) 5 MG tablet Take 1 tablet (5 mg total) by mouth every morning. 08/26/22  Yes Arnett, Lyn Records, FNP  citalopram (CELEXA) 20 MG tablet TAKE 1 TABLET(20 MG) BY MOUTH EVERY MORNING 02/05/23  Yes Allegra Grana, FNP  Coenzyme Q-10 200 MG CAPS Take 200 mg by mouth daily.    Yes [provider]  losartan (COZAAR) 50 MG tablet Take 1 tablet (50 mg total) by mouth daily. 08/26/22  Yes Arnett, Lyn Records, FNP  Multiple Vitamins-Minerals (MULTIVITAMIN WITH MINERALS) tablet Take 1 tablet by mouth daily.   Yes [provider]  nitrofurantoin (MACRODANTIN) 50 MG capsule Take 50 mg by mouth at bedtime. 03/05/23  Yes [provider]  Omega-3 Fatty Acids (FISH OIL PO) Take 1 capsule by mouth daily.   Yes [provider]  pravastatin (PRAVACHOL) 20 MG tablet Take 1 tablet (20 mg total) by mouth daily. 10/26/22  Yes Allegra Grana, FNP    Family History  Problem Relation Age of Onset   Alzheimer's disease Brother    Heart disease Sister    Cancer Sister        kidney   Cancer Mother        liver   Cancer Father        prostate     Social History   Tobacco Use   Smoking status: Never   Smokeless tobacco: Never  Substance Use Topics   Alcohol use: No   Drug use: No    Allergies as of 04/01/2023 -  Review Complete 04/01/2023  Allergen Reaction Noted   Simvastatin Other (See Comments) 11/02/2013    Review of Systems:    All systems reviewed and negative except where noted in HPI.   Physical Exam:  Vital signs in last 24 hours: Temp:  [97.9 F (36.6 C)-99 F (37.2 C)] 97.9 F (36.6 C) (01/31 0423) Pulse Rate:  [65-96] 96 (01/31 0439) Resp:  [16-20] 20 (01/31 0439) BP: (81-129)/(44-73) 86/49 (01/31 0439) SpO2:  [95 %-99 %] 99 % (01/31 0423) Weight:  [72.6 kg] 72.6 kg (01/30 1211) Last BM Date : 04/02/23 General:   Pleasant, cooperative in NAD Head:  Normocephalic and atraumatic. Eyes:   No icterus.   Conjunctiva pink. PERRLA. Ears:  Normal auditory acuity. Neck:  Supple; no masses or thyroidomegaly Lungs: Respirations even and unlabored. Lungs clear to auscultation bilaterally.   No wheezes, crackles, or rhonchi.  Heart:  Regular rate and rhythm;  Without murmur, clicks, rubs or gallops Abdomen:  Soft,  nondistended, nontender. Normal bowel sounds. No appreciable masses or hepatomegaly.  No rebound or guarding.  Rectal:  Not performed. Msk:  Symmetrical without gross deformities.    Extremities:  Without edema, cyanosis or clubbing. Neurologic:  Alert and and confused;  grossly normal neurologically. Skin:  Intact without significant lesions or rashes. Cervical Nodes:  No significant cervical adenopathy. Psych:  Alert and cooperative. Normal affect.  LAB RESULTS: Recent Labs    04/01/23 1215 04/01/23 1819 04/02/23 0012 04/02/23 0514 04/02/23 0754  WBC 10.7*  --   --   --  11.7*  HGB 11.6*   < > 10.0* 9.1* 9.2*  HCT 34.1*  --   --   --  26.6*  PLT 280  --   --   --  229   < > = values in this interval not displayed.   BMET Recent Labs    04/01/23 1215  NA 135  K 4.0  CL 103  CO2 21*  GLUCOSE 96  BUN 14  CREATININE 1.00  CALCIUM 9.8   LFT Recent Labs    04/01/23 1215  PROT 7.1  ALBUMIN 3.5  AST 15  ALT 10  ALKPHOS 50  BILITOT 0.4   PT/INR Recent Labs    04/01/23 1819  LABPROT 14.6  INR 1.1    STUDIES: CT ANGIO GI BLEED Result Date: 04/02/2023 CLINICAL DATA:  Lower GI bleeding. EXAM: CTA ABDOMEN AND PELVIS WITHOUT AND WITH CONTRAST TECHNIQUE: Multidetector CT imaging of the abdomen and pelvis was performed using the standard protocol during bolus administration of intravenous contrast. Multiplanar reconstructed images and MIPs were obtained and reviewed to evaluate the vascular anatomy. RADIATION DOSE REDUCTION: This exam was performed according to the departmental dose-optimization program which includes automated exposure control, adjustment of the mA and/or kV according to patient size and/or use of iterative reconstruction technique. CONTRAST:  OMNIPAQUE IOHEXOL 350 MG/ML SOLN COMPARISON:  None Available. FINDINGS: VASCULAR Aorta: Normal caliber aorta without aneurysm, dissection, vasculitis or significant stenosis. Moderate severe atherosclerotic  calcifications are seen throughout the aorta. Celiac: Patent without evidence of aneurysm, dissection, vasculitis or significant stenosis. SMA: Patent without evidence of aneurysm, dissection, vasculitis or significant stenosis. Renals: Both renal arteries are patent without evidence of aneurysm, dissection, vasculitis, fibromuscular dysplasia or significant stenosis. IMA: Twos 1 Inflow: Patent without evidence of aneurysm, dissection, vasculitis or significant stenosis. Proximal Outflow: Twos 1 Veins: No obvious venous abnormality within the limitations of this arterial phase study. Review of the MIP images confirms the above  findings. NON-VASCULAR Lower chest: There are ground-glass opacities in the lung bases, nonspecific. Hepatobiliary: Gallbladder is small in size versus surgically absent. There is no biliary ductal dilatation. No focal liver lesions are seen. Pancreas: Unremarkable. No pancreatic ductal dilatation or surrounding inflammatory changes. Spleen: Normal in size without focal abnormality. Adrenals/Urinary Tract: Heterogeneous mass is seen in the mid right kidney measuring 3.1 x 3.7 cm. There is no hydronephrosis or perinephric fat stranding. There are bilateral renal cysts measuring up to 14 mm. The adrenal glands and bladder are within normal limits. Stomach/Bowel: Stomach is within normal limits. Appendix appears normal. No evidence of bowel wall thickening, distention, or inflammatory changes. No active gastrointestinal bleeding identified. There is diffuse colonic diverticulosis. There is a large amount of stool in the rectosigmoid colon. Lymphatic: No enlarged lymph nodes are identified. Reproductive: Status post hysterectomy. No adnexal masses. Other: There is a small fat containing umbilical hernia. There is no ascites. Musculoskeletal: Bones are osteopenic. Degenerative changes affect the spine. There severe degenerative changes of the left hip. Right hip arthroplasty is present. IMPRESSION:  1. No evidence for active gastrointestinal bleeding. Aortic Atherosclerosis (ICD10-I70.0). NON-VASCULAR 1. 3.7 cm heterogeneous mass in the mid right kidney worrisome for renal cell carcinoma. Recommend MRI for further evaluation. 2. Colonic diverticulosis. 3. Large amount of stool in the rectosigmoid colon. 4. Ground-glass opacities in the lung bases, nonspecific. Electronically Signed   By: Darliss Cheney M.D.   On: 04/02/2023 02:16      Impression / Plan:   Assessment: Principal Problem:   Lower GI bleed Active Problems:   Alzheimer's disease (HCC)   Essential hypertension   CKD stage 3a, GFR 45-59 ml/min (HCC)   Katherine Olson is a 89 y.o. y/o female with rectal bleeding and a drop in hemoglobin.  The patient is taking care of by her son.  I have had a very long discussion with the patient's son about the risks of any endoscopic procedures on the patient including the risk of sedation, perforation and aspiration.  We have also discussed the likelihood of stopping a diverticular bleed if the causes diverticulosis which was seen back in 2015 during her last colonoscopy and was also delineated on the CT scan done yesterday.  Plan:  The patient's son and I have agreed that the plan should be that we continue watching the patient's blood counts and transfuse as needed for symptoms.  The patient will hold off on any invasive procedures at this time.  If the patient should show any sign of further bleeding or drop in hemoglobin we may readdress the need for any intervention including a red tagged cell scan CT angiography with interventional radiology or vascular surgery intervention for embolization  A colonoscopy can be considered if the patient continues to slowly ooze without imaging being positive.  The patient's son has agreed to the plan.  Thank you for involving me in the care of this patient.      LOS: 1 day   Midge Minium, MD, MD. Clementeen Graham 04/02/2023, 8:42 AM,  Pager [phone] 7am-5pm   Check AMION for 5pm -7am coverage and on weekends   Note: This dictation was prepared with Dragon dictation along with smaller phrase technology. Any transcriptional errors that result from this process are unintentional.

## 2023-04-03 DIAGNOSIS — K922 Gastrointestinal hemorrhage, unspecified: Secondary | ICD-10-CM | POA: Diagnosis not present

## 2023-04-03 LAB — CBC
HCT: 25.2 % — ABNORMAL LOW (ref 36.0–46.0)
Hemoglobin: 8.7 g/dL — ABNORMAL LOW (ref 12.0–15.0)
MCH: 30.2 pg (ref 26.0–34.0)
MCHC: 34.5 g/dL (ref 30.0–36.0)
MCV: 87.5 fL (ref 80.0–100.0)
Platelets: 187 10*3/uL (ref 150–400)
RBC: 2.88 MIL/uL — ABNORMAL LOW (ref 3.87–5.11)
RDW: 13.6 % (ref 11.5–15.5)
WBC: 11.4 10*3/uL — ABNORMAL HIGH (ref 4.0–10.5)
nRBC: 0 % (ref 0.0–0.2)

## 2023-04-03 LAB — BPAM RBC
Blood Product Expiration Date: 202502182359
ISSUE DATE / TIME: 202501310923
Unit Type and Rh: 7300

## 2023-04-03 LAB — BASIC METABOLIC PANEL
Anion gap: 6 (ref 5–15)
Anion gap: 9 (ref 5–15)
BUN: 17 mg/dL (ref 8–23)
BUN: 18 mg/dL (ref 8–23)
CO2: 17 mmol/L — ABNORMAL LOW (ref 22–32)
CO2: 20 mmol/L — ABNORMAL LOW (ref 22–32)
Calcium: 8.7 mg/dL — ABNORMAL LOW (ref 8.9–10.3)
Calcium: 8.9 mg/dL (ref 8.9–10.3)
Chloride: 109 mmol/L (ref 98–111)
Chloride: 112 mmol/L — ABNORMAL HIGH (ref 98–111)
Creatinine, Ser: 0.86 mg/dL (ref 0.44–1.00)
Creatinine, Ser: 0.94 mg/dL (ref 0.44–1.00)
GFR, Estimated: 56 mL/min — ABNORMAL LOW (ref >60)
GFR, Estimated: >60 mL/min (ref >60)
Glucose, Bld: 94 mg/dL (ref 70–99)
Glucose, Bld: 95 mg/dL (ref 70–99)
Potassium: 4.1 mmol/L (ref 3.5–5.1)
Potassium: 4.4 mmol/L (ref 3.5–5.1)
Sodium: 135 mmol/L (ref 135–145)
Sodium: 138 mmol/L (ref 135–145)

## 2023-04-03 LAB — TYPE AND SCREEN
ABO/RH(D): B POS
Antibody Screen: NEGATIVE
Unit division: 0

## 2023-04-03 LAB — BLOOD GAS, VENOUS
Acid-base deficit: 4.8 mmol/L — ABNORMAL HIGH (ref 0.0–2.0)
Bicarbonate: 20.5 mmol/L (ref 20.0–28.0)
O2 Saturation: 63.5 %
Patient temperature: 37
pCO2, Ven: 38 mm[Hg] — ABNORMAL LOW (ref 44–60)
pH, Ven: 7.34 (ref 7.25–7.43)
pO2, Ven: 39 mm[Hg] (ref 32–45)

## 2023-04-03 LAB — HEMOGLOBIN: Hemoglobin: 8.6 g/dL — ABNORMAL LOW (ref 12.0–15.0)

## 2023-04-03 MED ORDER — PRAVASTATIN SODIUM 20 MG PO TABS
20.0000 mg | ORAL_TABLET | Freq: Every day | ORAL | Status: DC
  Administered 2023-04-03 – 2023-04-04 (×2): 20 mg via ORAL
  Filled 2023-04-03 (×2): qty 1

## 2023-04-03 MED ORDER — OMEGA-3-ACID ETHYL ESTERS 1 G PO CAPS
1.0000 g | ORAL_CAPSULE | Freq: Every day | ORAL | Status: DC
  Administered 2023-04-03 – 2023-04-04 (×2): 1 g via ORAL
  Filled 2023-04-03 (×2): qty 1

## 2023-04-03 MED ORDER — SODIUM CHLORIDE 0.9 % IV SOLN: INTRAVENOUS | Status: DC

## 2023-04-03 MED ORDER — ADULT MULTIVITAMIN W/MINERALS CH
1.0000 | ORAL_TABLET | Freq: Every day | ORAL | Status: DC
  Administered 2023-04-03 – 2023-04-04 (×2): 1 via ORAL
  Filled 2023-04-03 (×2): qty 1

## 2023-04-03 MED ORDER — PSYLLIUM 95 % PO PACK
1.0000 | PACK | Freq: Every day | ORAL | Status: DC
  Administered 2023-04-03 – 2023-04-04 (×2): 1 via ORAL
  Filled 2023-04-03 (×2): qty 1

## 2023-04-03 MED ORDER — POLYETHYLENE GLYCOL 3350 17 G PO PACK
17.0000 g | PACK | Freq: Every day | ORAL | Status: DC
  Administered 2023-04-03 – 2023-04-04 (×2): 17 g via ORAL
  Filled 2023-04-03 (×2): qty 1

## 2023-04-03 NOTE — Plan of Care (Signed)
   Problem: Clinical Measurements: Goal: Ability to maintain clinical measurements within normal limits will improve Outcome: Progressing

## 2023-04-03 NOTE — Plan of Care (Signed)

## 2023-04-03 NOTE — Progress Notes (Signed)
GI Inpatient Follow-up Note  Subjective:  Patient seen and sleeping. Son at bedside and noted no acute events. Tagged RBC scan was negative.  Scheduled Inpatient Medications:   citalopram  20 mg Oral Daily   pantoprazole (PROTONIX) IV  40 mg Intravenous Q12H   polyethylene glycol  17 g Oral Daily   psyllium  1 packet Oral Daily    Continuous Inpatient Infusions:    sodium chloride 75 mL/hr at 04/03/23 0852    PRN Inpatient Medications:  ondansetron (ZOFRAN) IV  Review of Systems:  Unable to assess due to sleeping   Physical Examination: BP (!) 121/56 (BP Location: Left Arm)   Pulse 70   Temp 97.9 F (36.6 C) (Oral)   Resp 20   Ht 5\' 6"  (1.676 m)   Wt 72.6 kg   SpO2 98%   BMI 25.82 kg/m   Exam deferred due to sleeping   Data: Lab Results  Component Value Date   WBC 11.4 (H) 04/03/2023   HGB 8.7 (L) 04/03/2023   HCT 25.2 (L) 04/03/2023   MCV 87.5 04/03/2023   PLT 187 04/03/2023   Recent Labs  Lab 04/02/23 1754 04/02/23 2325 04/03/23 0604  HGB 10.3* 9.0* 8.7*   Lab Results  Component Value Date   NA 138 04/03/2023   K 4.4 04/03/2023   CL 112 (H) 04/03/2023   CO2 20 (L) 04/03/2023   BUN 18 04/03/2023   CREATININE 0.94 04/03/2023   GLU 92 04/11/2013   Lab Results  Component Value Date   ALT 10 04/01/2023   AST 15 04/01/2023   ALKPHOS 50 04/01/2023   BILITOT 0.4 04/01/2023   Recent Labs  Lab 04/02/23 0754  INR 1.2   Assessment/Plan: Katherine Olson is a 89 y.o. lady with dementia who presented with likely diverticular bleeding. CTA and tagged rbc scan negative. No further bowel movements  Recommendations:  - continue to monitor for any overt GI bleeding - will start bowel regimen - continue clear liquids, advance as tolerated - transfuse for hemoglobin < 7 - maintain active type and screen - daily CBC - if hemodynamically significant bleeding, would recommend stat CTA  Merlyn Lot MD, MPH [facility]

## 2023-04-03 NOTE — Progress Notes (Signed)
[facility]  - [place] at [facility]   PATIENT NAME: Katherine Olson    MR#:  [id number]  DATE OF BIRTH:  01/10/1927  SUBJECTIVE:  son at bedside. Gives most history. No more rectal bleed. Tolerating clear liquid. Will advance to full liquid. Blood pressure much improved after IV fluids. Son to encourage oral fluids    VITALS:  Blood pressure 137/61, pulse 67, temperature 97.8 F (36.6 C), temperature source Oral, resp. rate 16, height 5\' 6"  (1.676 m), weight 72.6 kg, SpO2 98%.  PHYSICAL EXAMINATION:  limited GENERAL:  89 y.o.-year-old patient with no acute distress.  LUNGS: Normal breath sounds bilaterally, no wheezing CARDIOVASCULAR: S1, S2 normal. No murmur   ABDOMEN: Soft, nontender, nondistended.  EXTREMITIES: No  edema b/l.    NEUROLOGIC: nonfocal  patient is alert and awake   LABORATORY PANEL:  CBC Recent Labs  Lab 04/03/23 0604 04/03/23 1222  WBC 11.4*  --   HGB 8.7* 8.6*  HCT 25.2*  --   PLT 187  --     Chemistries  Recent Labs  Lab 04/01/23 1215 04/03/23 0007 04/03/23 0604  NA 135   < > 138  K 4.0   < > 4.4  CL 103   < > 112*  CO2 21*   < > 20*  GLUCOSE 96   < > 94  BUN 14   < > 18  CREATININE 1.00   < > 0.94  CALCIUM 9.8   < > 8.9  AST 15  --   --   ALT 10  --   --   ALKPHOS 50  --   --   BILITOT 0.4  --   --    < > = values in this interval not displayed.   Cardiac Enzymes No results for input(s): "TROPONINI" in the last 168 hours. RADIOLOGY:  NM GI Blood Loss Result Date: 04/02/2023 CLINICAL DATA:  Hematochezia EXAM: NUCLEAR MEDICINE GASTROINTESTINAL BLEEDING SCAN TECHNIQUE: Sequential abdominal images were obtained following intravenous administration of Tc-38m labeled red blood cells. RADIOPHARMACEUTICALS:  22.47 mCi Tc-30m pertechnetate in-vitro labeled red cells. COMPARISON:  CTA from the same day FINDINGS: No evidence of acute GI bleed. Physiologic distribution of radiopharmaceutical. IMPRESSION: Negative Electronically  Signed   By: Corlis Leak M.D.   On: 04/02/2023 17:28   CT ANGIO GI BLEED Result Date: 04/02/2023 CLINICAL DATA:  Lower GI bleeding. EXAM: CTA ABDOMEN AND PELVIS WITHOUT AND WITH CONTRAST TECHNIQUE: Multidetector CT imaging of the abdomen and pelvis was performed using the standard protocol during bolus administration of intravenous contrast. Multiplanar reconstructed images and MIPs were obtained and reviewed to evaluate the vascular anatomy. RADIATION DOSE REDUCTION: This exam was performed according to the departmental dose-optimization program which includes automated exposure control, adjustment of the mA and/or kV according to patient size and/or use of iterative reconstruction technique. CONTRAST:  OMNIPAQUE IOHEXOL 350 MG/ML SOLN COMPARISON:  None Available. FINDINGS: VASCULAR Aorta: Normal caliber aorta without aneurysm, dissection, vasculitis or significant stenosis. Moderate severe atherosclerotic calcifications are seen throughout the aorta. Celiac: Patent without evidence of aneurysm, dissection, vasculitis or significant stenosis. SMA: Patent without evidence of aneurysm, dissection, vasculitis or significant stenosis. Renals: Both renal arteries are patent without evidence of aneurysm, dissection, vasculitis, fibromuscular dysplasia or significant stenosis. IMA: Twos 1 Inflow: Patent without evidence of aneurysm, dissection, vasculitis or significant stenosis. Proximal Outflow: Twos 1 Veins: No obvious venous abnormality within the limitations of this arterial phase study. Review of the MIP images confirms  the above findings. NON-VASCULAR Lower chest: There are ground-glass opacities in the lung bases, nonspecific. Hepatobiliary: Gallbladder is small in size versus surgically absent. There is no biliary ductal dilatation. No focal liver lesions are seen. Pancreas: Unremarkable. No pancreatic ductal dilatation or surrounding inflammatory changes. Spleen: Normal in size without focal abnormality.  Adrenals/Urinary Tract: Heterogeneous mass is seen in the mid right kidney measuring 3.1 x 3.7 cm. There is no hydronephrosis or perinephric fat stranding. There are bilateral renal cysts measuring up to 14 mm. The adrenal glands and bladder are within normal limits. Stomach/Bowel: Stomach is within normal limits. Appendix appears normal. No evidence of bowel wall thickening, distention, or inflammatory changes. No active gastrointestinal bleeding identified. There is diffuse colonic diverticulosis. There is a large amount of stool in the rectosigmoid colon. Lymphatic: No enlarged lymph nodes are identified. Reproductive: Status post hysterectomy. No adnexal masses. Other: There is a small fat containing umbilical hernia. There is no ascites. Musculoskeletal: Bones are osteopenic. Degenerative changes affect the spine. There severe degenerative changes of the left hip. Right hip arthroplasty is present. IMPRESSION: 1. No evidence for active gastrointestinal bleeding. Aortic Atherosclerosis (ICD10-I70.0). NON-VASCULAR 1. 3.7 cm heterogeneous mass in the mid right kidney worrisome for renal cell carcinoma. Recommend MRI for further evaluation. 2. Colonic diverticulosis. 3. Large amount of stool in the rectosigmoid colon. 4. Ground-glass opacities in the lung bases, nonspecific. Electronically Signed   By: Darliss Cheney M.D.   On: 04/02/2023 02:16    Assessment and Plan Katherine Olson is a 89 y.o. female with medical history significant for advanced alzheimer's dementia, htn, who presents with rectal bleed   Son cares for her, she is mostly bedbound but can ambulate some with a walker. Normal BMs, brown, no nsaids. No current or recent abdominal pain, no fevers, no diarrhea or nausea/vomiting. Today had bm that was mixed with dark blood. Maroon-blood on dre per EDP.  G.I. bleed likely lower appears diverticular -- hemoglobin stable. -- CT angiogram negative for active bleed -- seen by G.I. Conservative  management. Will advance full to full liquid diet. -- IV fluids for hydration  Right renal mass, incidental seen on CT -- discussed with son if he wants aggressive workup done that needs to follow-up with urology as outpatient ---per son aware and tells me it is chronic  HTN --stable --did get IVF for low bp today  CKD 3a --stable  Complex biliary history [facility] record lists history several stents. No abd pain, LFTs normal - monitor   #Alzheimer dementia Without behavioral disturbance  MDD - home celexa - delirium precautions      DVT prophylaxis: SCDs Code Status: DNR/DNI, confirmed w/ son  Family Communication:  son updated extensively by GI on 1/31 Consults called: GI  Level of care: Med-Surg Status is: Inpatient Remains inpatient appropriate because: will advance diet. If remains stable discharge tomorrow. Son is agreeable.    TOTAL TIME TAKING CARE OF THIS PATIENT: 35 minutes.  >50% time spent on counselling and coordination of care  Note: This dictation was prepared with Dragon dictation along with smaller phrase technology. Any transcriptional errors that result from this process are unintentional.  Enedina Finner M.D    [facility]   CC: Primary care physician; System, Provider Not In

## 2023-04-03 NOTE — Progress Notes (Signed)
Patient's BP 96/44 with Dinamap and 90/40  manual. NP Jon Billings made aware.

## 2023-04-04 DIAGNOSIS — K922 Gastrointestinal hemorrhage, unspecified: Secondary | ICD-10-CM | POA: Diagnosis not present

## 2023-04-04 LAB — HEMOGLOBIN: Hemoglobin: 8.9 g/dL — ABNORMAL LOW (ref 12.0–15.0)

## 2023-04-04 MED ORDER — PANTOPRAZOLE SODIUM 40 MG PO TBEC: 40.0000 mg | DELAYED_RELEASE_TABLET | Freq: Every day | ORAL | 0 refills | Status: AC

## 2023-04-04 MED ORDER — PSYLLIUM 95 % PO PACK: 1.0000 | PACK | Freq: Every day | ORAL | 0 refills | Status: AC

## 2023-04-04 MED ORDER — POLYETHYLENE GLYCOL 3350 17 G PO PACK: 17.0000 g | PACK | Freq: Every day | ORAL | 0 refills | Status: AC

## 2023-04-04 MED ORDER — PANTOPRAZOLE SODIUM 40 MG PO TBEC
40.0000 mg | DELAYED_RELEASE_TABLET | Freq: Every day | ORAL | Status: DC
  Administered 2023-04-04: 40 mg via ORAL
  Filled 2023-04-04: qty 1

## 2023-04-04 NOTE — TOC CM/SW Note (Signed)
Notified by MD patient is active with [facility], plan to resume and DC Sunday.  Notified Katina with [facility] who confirmed they are active.  Alfonso Ramus, LCSW Transitions of Care Department [phone]

## 2023-04-04 NOTE — Discharge Summary (Signed)
Physician Discharge Summary   Patient: Katherine Olson MRN: [id number] DOB: [birth year]  Admit date:     04/01/2023  Discharge date: 04/04/23  Discharge Physician: Enedina Finner   PCP: Allegra Grana, FNP   Recommendations at discharge:    F/u Dr Mia Creek GI in 1-2 weeks F/u PCP in 1-2 weeks  Discharge Diagnoses: Principal Problem:   Lower GI bleed Active Problems:   Alzheimer's disease (HCC)   Essential hypertension   CKD stage 3a, GFR 45-59 ml/min (HCC)   [name] is a 89 y.o. female with medical history significant for advanced alzheimer's dementia, htn, who presents with rectal bleed   Son cares for her, she is mostly bedbound but can ambulate some with a walker. Normal BMs, brown, no nsaids. No current or recent abdominal pain, no fevers, no diarrhea or nausea/vomiting. Today had bm that was mixed with dark blood. Maroon-blood on dre per EDP.   G.I. bleed likely lower appears diverticular -- hemoglobin stable. -- CT angiogram negative for active bleed -- seen by G.I. Conservative management. Will advance full to full liquid diet. -- recieved IV fluids for hydration --hgb stable at 8.9. No more blood he stools. Patient tolerating PO diet   Right renal mass, incidental seen on CT ---per son aware and tells me it is chronic and does not wish to get any work up   HTN --stable --did get IVF for low bp today   CKD 3a --stable   Complex biliary history [facility] record lists history several stents. No abd pain, LFTs normal - monitor   #Alzheimer dementia Without behavioral disturbance  MDD - home celexa - delirium precautions  PT tried to work with patient. She was a bit anxious and resistant to walk. Discussed with son. PT recommends rehab. Given dementia and anxiety son would like patient to go home and resume previous home health.      DVT prophylaxis: SCDs Code Status: DNR/DNI, confirmed w/ son  Family Communication:  son updated today  Consults called: GI        Diet recommendation:  Discharge Diet Orders (From admission, onward)     Start     Ordered   04/04/23 0000  Diet - low sodium heart healthy        04/04/23 1249            DISCHARGE MEDICATION: Allergies as of 04/04/2023       Reactions   Simvastatin Other (See Comments)   Stiffness in joints Onset 07/24/2003.        Medication List     TAKE these medications    amLODipine 5 MG tablet Commonly known as: NORVASC Take 1 tablet (5 mg total) by mouth every morning.   citalopram 20 MG tablet Commonly known as: CELEXA TAKE 1 TABLET(20 MG) BY MOUTH EVERY MORNING   Coenzyme Q-10 200 MG Caps Take 200 mg by mouth daily.   FISH OIL PO Take 1 capsule by mouth daily.   losartan 50 MG tablet Commonly known as: COZAAR Take 1 tablet (50 mg total) by mouth daily.   multivitamin with minerals tablet Take 1 tablet by mouth daily.   nitrofurantoin 50 MG capsule Commonly known as: MACRODANTIN Take 50 mg by mouth at bedtime.   pantoprazole 40 MG tablet Commonly known as: PROTONIX Take 1 tablet (40 mg total) by mouth daily. Start taking on: April 05, 2023   polyethylene glycol 17 g packet Commonly known as: MIRALAX / GLYCOLAX Take 17 g by  mouth daily. Start taking on: April 05, 2023   pravastatin 20 MG tablet Commonly known as: PRAVACHOL Take 1 tablet (20 mg total) by mouth daily.   psyllium 95 % Pack Commonly known as: HYDROCIL/METAMUCIL Take 1 packet by mouth daily. Start taking on: April 05, 2023        Follow-up Information     Mia Creek, Rossie Muskrat, MD. Schedule an appointment as soon as possible for a visit in 2 week(s).   Specialty: Gastroenterology Why: for rectal bleed Contact information: [address] [place] [address] [phone]         Allegra Grana, FNP. Schedule an appointment as soon as possible for a visit in 1 week(s).   Specialty: Family Medicine Why: hosp f/u Contact information: [address] [place] [address] [phone]                Discharge Exam: Ceasar Mons Weights   04/01/23 1211  Weight: 72.6 kg   limited GENERAL:  89 y.o.-year-old patient with no acute distress.  LUNGS: Normal breath sounds bilaterally, no wheezing CARDIOVASCULAR: S1, S2 normal. No murmur   ABDOMEN: Soft, nontender, nondistended.  EXTREMITIES: No  edema b/l.    NEUROLOGIC: nonfocal  patient is alert and awake, confused at baseline  Condition at discharge: fair  The results of significant diagnostics from this hospitalization (including imaging, microbiology, ancillary and laboratory) are listed below for reference.   Imaging Studies: NM GI Blood Loss Result Date: 04/02/2023 CLINICAL DATA:  Hematochezia EXAM: NUCLEAR MEDICINE GASTROINTESTINAL BLEEDING SCAN TECHNIQUE: Sequential abdominal images were obtained following intravenous administration of Tc-68m labeled red blood cells. RADIOPHARMACEUTICALS:  22.47 mCi Tc-4m pertechnetate in-vitro labeled red cells. COMPARISON:  CTA from the same day FINDINGS: No evidence of acute GI bleed. Physiologic distribution of radiopharmaceutical. IMPRESSION: Negative Electronically Signed   By: Corlis Leak M.D.   On: 04/02/2023 17:28   CT ANGIO GI BLEED Result Date: 04/02/2023 CLINICAL DATA:  Lower GI bleeding. EXAM: CTA ABDOMEN AND PELVIS WITHOUT AND WITH CONTRAST TECHNIQUE: Multidetector CT imaging of the abdomen and pelvis was performed using the standard protocol during bolus administration of intravenous contrast. Multiplanar reconstructed images and MIPs were obtained and reviewed to evaluate the vascular anatomy. RADIATION DOSE REDUCTION: This exam was performed according to the departmental dose-optimization program which includes automated exposure control, adjustment of the mA and/or kV according to patient size and/or use of iterative reconstruction technique. CONTRAST:  OMNIPAQUE IOHEXOL 350 MG/ML SOLN COMPARISON:  None Available.  FINDINGS: VASCULAR Aorta: Normal caliber aorta without aneurysm, dissection, vasculitis or significant stenosis. Moderate severe atherosclerotic calcifications are seen throughout the aorta. Celiac: Patent without evidence of aneurysm, dissection, vasculitis or significant stenosis. SMA: Patent without evidence of aneurysm, dissection, vasculitis or significant stenosis. Renals: Both renal arteries are patent without evidence of aneurysm, dissection, vasculitis, fibromuscular dysplasia or significant stenosis. IMA: Twos 1 Inflow: Patent without evidence of aneurysm, dissection, vasculitis or significant stenosis. Proximal Outflow: Twos 1 Veins: No obvious venous abnormality within the limitations of this arterial phase study. Review of the MIP images confirms the above findings. NON-VASCULAR Lower chest: There are ground-glass opacities in the lung bases, nonspecific. Hepatobiliary: Gallbladder is small in size versus surgically absent. There is no biliary ductal dilatation. No focal liver lesions are seen. Pancreas: Unremarkable. No pancreatic ductal dilatation or surrounding inflammatory changes. Spleen: Normal in size without focal abnormality. Adrenals/Urinary Tract: Heterogeneous mass is seen in the mid right kidney measuring 3.1 x 3.7 cm. There  is no hydronephrosis or perinephric fat stranding. There are bilateral renal cysts measuring up to 14 mm. The adrenal glands and bladder are within normal limits. Stomach/Bowel: Stomach is within normal limits. Appendix appears normal. No evidence of bowel wall thickening, distention, or inflammatory changes. No active gastrointestinal bleeding identified. There is diffuse colonic diverticulosis. There is a large amount of stool in the rectosigmoid colon. Lymphatic: No enlarged lymph nodes are identified. Reproductive: Status post hysterectomy. No adnexal masses. Other: There is a small fat containing umbilical hernia. There is no ascites. Musculoskeletal: Bones are  osteopenic. Degenerative changes affect the spine. There severe degenerative changes of the left hip. Right hip arthroplasty is present. IMPRESSION: 1. No evidence for active gastrointestinal bleeding. Aortic Atherosclerosis (ICD10-I70.0). NON-VASCULAR 1. 3.7 cm heterogeneous mass in the mid right kidney worrisome for renal cell carcinoma. Recommend MRI for further evaluation. 2. Colonic diverticulosis. 3. Large amount of stool in the rectosigmoid colon. 4. Ground-glass opacities in the lung bases, nonspecific. Electronically Signed   By: Darliss Cheney M.D.   On: 04/02/2023 02:16    Microbiology: Results for orders placed or performed during the hospital encounter of 03/01/23  Urine Culture     Status: Abnormal   Collection Time: 03/01/23  2:09 AM   Specimen: Urine, Random  Result Value Ref Range Status   Specimen Description   Final    URINE, RANDOM Performed at [facility], [address] [place], [place] [address]    Special Requests   Final    NONE Reflexed from [id number] Performed at [facility], [address] [place], [place] [address]    Culture (A)  Final    >=100,000 COLONIES/mL ENTEROCOCCUS FAECALIS >=100,000 COLONIES/mL AEROCOCCUS SPECIES Standardized susceptibility testing for this organism is not available. Performed at [facility], [address], [place], [place] [address]    Report Status 03/03/2023 FINAL  Final   Organism ID, Bacteria ENTEROCOCCUS FAECALIS (A)  Final      Susceptibility   Enterococcus faecalis - MIC*    AMPICILLIN <=2 SENSITIVE Sensitive     NITROFURANTOIN <=16 SENSITIVE Sensitive     VANCOMYCIN 1 SENSITIVE Sensitive     * >=100,000 COLONIES/mL ENTEROCOCCUS FAECALIS    Labs: CBC: Recent Labs  Lab 04/01/23 1215 04/01/23 1819 04/02/23 0754 04/02/23 1754 04/02/23 2325 04/03/23 0604 04/03/23 1222 04/04/23 0447  WBC 10.7*  --  11.7*  --   --  11.4*  --   --   HGB 11.6*   < > 9.2* 10.3* 9.0* 8.7* 8.6* 8.9*  HCT  34.1*  --  26.6*  --   --  25.2*  --   --   MCV 87.9  --  88.4  --   --  87.5  --   --   PLT 280  --  229  --   --  187  --   --    < > = values in this interval not displayed.   Basic Metabolic Panel: Recent Labs  Lab 04/01/23 1215 04/03/23 0007 04/03/23 0604  NA 135 135 138  K 4.0 4.1 4.4  CL 103 109 112*  CO2 21* 17* 20*  GLUCOSE 96 95 94  BUN 14 17 18   CREATININE 1.00 0.86 0.94  CALCIUM 9.8 8.7* 8.9   Liver Function Tests: Recent Labs  Lab 04/01/23 1215  AST 15  ALT 10  ALKPHOS 50  BILITOT 0.4  PROT 7.1  ALBUMIN 3.5   CBG: Recent Labs  Lab 04/02/23 2328  GLUCAP 102*    Discharge time spent: greater than 30 minutes.  Signed: Enedina Finner, MD [facility] 04/04/2023

## 2023-04-04 NOTE — Progress Notes (Signed)
Nsg Discharge Note  Admit Date:  04/01/2023 Discharge date: 04/04/2023   Katherine Olson to be D/C'd Home per MD order.  AVS completed.  Copy for chart, and copy for patient signed, and dated. Patient/caregiver able to verbalize understanding.  Discharge Medication: Allergies as of 04/04/2023       Reactions   Simvastatin Other (See Comments)   Stiffness in joints Onset 07/24/2003.        Medication List     TAKE these medications    amLODipine 5 MG tablet Commonly known as: NORVASC Take 1 tablet (5 mg total) by mouth every morning.   citalopram 20 MG tablet Commonly known as: CELEXA TAKE 1 TABLET(20 MG) BY MOUTH EVERY MORNING   Coenzyme Q-10 200 MG Caps Take 200 mg by mouth daily.   FISH OIL PO Take 1 capsule by mouth daily.   losartan 50 MG tablet Commonly known as: COZAAR Take 1 tablet (50 mg total) by mouth daily.   multivitamin with minerals tablet Take 1 tablet by mouth daily.   nitrofurantoin 50 MG capsule Commonly known as: MACRODANTIN Take 50 mg by mouth at bedtime.   pantoprazole 40 MG tablet Commonly known as: PROTONIX Take 1 tablet (40 mg total) by mouth daily. Start taking on: April 05, 2023   polyethylene glycol 17 g packet Commonly known as: MIRALAX / GLYCOLAX Take 17 g by mouth daily. Start taking on: April 05, 2023   pravastatin 20 MG tablet Commonly known as: PRAVACHOL Take 1 tablet (20 mg total) by mouth daily.   psyllium 95 % Pack Commonly known as: HYDROCIL/METAMUCIL Take 1 packet by mouth daily. Start taking on: April 05, 2023        Discharge Assessment: Vitals:   04/04/23 0319 04/04/23 0905  BP: 134/64 (!) 156/84  Pulse: 66 70  Resp: 17 16  Temp: (!) 97.5 F (36.4 C) 97.9 F (36.6 C)  SpO2: 100% 98%   Skin clean, dry and intact without evidence of skin break down, no evidence of skin tears noted. IV catheter discontinued intact. Site without signs and symptoms of complications - no redness or edema noted at  insertion site, patient denies c/o pain - only slight tenderness at site.  Dressing with slight pressure applied.  D/c Instructions-Education: Discharge instructions given to patient/family with verbalized understanding. D/c education completed with patient/family including follow up instructions, medication list, d/c activities limitations if indicated, with other d/c instructions as indicated by MD - patient able to verbalize understanding, all questions fully answered. Patient/family instructed to return to ED, call 911, or call MD for any changes in condition.  Patient transported home via EMS. Family previously given D/C paperwork. Family called and notified that pt is on the way.   Adair Laundry, RN 04/04/2023 4:10 PM

## 2023-04-04 NOTE — Discharge Instructions (Signed)
Follow up with PCP and pick up medications from pharmacy. Make sure to take all antibiotics. Do not double up on narcotic/pain medication or drink alcohol during this time. Do not drive while taking narcotic medication. Call 911 or return to ER for life threatening issues or other concerns.

## 2023-04-04 NOTE — TOC Initial Note (Signed)
Transition of Care [facility]) - Initial/Assessment Note    Patient Details  Name: Katherine Olson MRN: [id number] Date of Birth: Jul 15, 1926  Transition of Care [facility]) CM/SW Contact:    Liliana Cline, LCSW Phone Number: 04/04/2023, 1:02 PM  Clinical Narrative:                 CSW spoke with son [name] regarding DC planning and SNF recommendation from therapy. Patient lives with [name]. Patient is active with [facility] for PT, OT, and Aide (confirmed this with [facility] Rep Laurelyn Sickle as well). Patient has a rollator. [name] states he does not want SNF, would like patient to return home with [facility], states patient has good support at home - notified Laurelyn Sickle of DC today. [name] requests EMS transport - confirmed address - [facility] arranged for 2pm pick up (or later pending truck availability) per RN request.  Expected Discharge Plan: Home w Home Health Services Barriers to Discharge: Barriers Resolved   Patient Goals and CMS Choice Patient states their goals for this hospitalization and ongoing recovery are:: declined SNF, home with [facility] CMS Medicare.gov Compare Post Acute Care list provided to:: Patient Represenative (must comment) Choice offered to / list presented to : Adult Children      Expected Discharge Plan and Services       Living arrangements for the past 2 months: Single Family Home Expected Discharge Date: 04/04/23                         HH Arranged: PT, OT, Nurse's Aide HH Agency: [facility] Date [facility] Agency Contacted: 04/04/23   Representative spoke with at [facility] Agency: Laurelyn Sickle  Prior Living Arrangements/Services Living arrangements for the past 2 months: Single Family Home Lives with:: Adult Children Patient language and need for interpreter reviewed:: Yes Do you feel safe going back to the place where you live?: Yes      Need for Family Participation in Patient Care: Yes (Comment) Care giver support system in place?: Yes (comment) Current home services: DME,  Home OT, Home PT, Homehealth aide Criminal Activity/Legal Involvement Pertinent to Current Situation/Hospitalization: No - Comment as needed  Activities of Daily Living   ADL Screening (condition at time of admission) Independently performs ADLs?: No Does the patient have a NEW difficulty with bathing/dressing/toileting/self-feeding that is expected to last >3 days?: No Does the patient have a NEW difficulty with getting in/out of bed, walking, or climbing stairs that is expected to last >3 days?: No Does the patient have a NEW difficulty with communication that is expected to last >3 days?: No Is the patient deaf or have difficulty hearing?: Yes Does the patient have difficulty seeing, even when wearing glasses/contacts?: No Does the patient have difficulty concentrating, remembering, or making decisions?: Yes  Permission Sought/Granted Permission sought to share information with : Facility Industrial/product designer granted to share information with : Yes, Verbal Permission Granted     Permission granted to share info w AGENCY: [facility]        Emotional Assessment         Alcohol / Substance Use: Not Applicable Psych Involvement: No (comment)  Admission diagnosis:  Lower GI bleed [K92.2] Gastrointestinal hemorrhage, unspecified gastrointestinal hemorrhage type [K92.2] Patient Active Problem List   Diagnosis Date Noted   Lower GI bleed 04/01/2023   CKD stage 3a, GFR 45-59 ml/min (HCC) 04/01/2023   Stage 2 chronic kidney disease 02/18/2023   Acute vaginitis  05/06/2021   Acute cystitis with hematuria 02/19/2021   Decreased GFR 02/19/2021   Corns and callosities 04/18/2020   Right renal mass 02/05/2020   Pain due to onychomycosis of toenails of both feet 08/14/2019   BPPV (benign paroxysmal positional vertigo) 12/13/2017   Anxiety and depression 12/13/2017   Osteopenia 03/17/2017   Nausea & vomiting 08/02/2016   Atrophic vaginitis 08/08/2015   Vulvar cysts  08/06/2015   Routine physical examination 08/19/2014   Cataract 04/06/2014   Alzheimer's disease (HCC) 04/06/2014   Essential hypertension 04/06/2014   Recurrent UTI 11/02/2013   Memory loss 11/02/2013   Hyperlipidemia    PCP:  Allegra Grana, FNP Pharmacy:   [facility] [phone] [place] [address], [place] - [address] AT [facility] OF [address] [place] [address] [place] [address] Phone: [phone] Fax: [phone]     Social Drivers of Health (SDOH) Social History: SDOH Screenings   Food Insecurity: No Food Insecurity (04/02/2023)  Housing: Low Risk  (04/02/2023)  Transportation Needs: No Transportation Needs (04/02/2023)  Utilities: Not At Risk (04/02/2023)  Depression (PHQ2-9): Low Risk  (02/03/2023)  Financial Resource Strain: Low Risk  (05/01/2022)  Physical Activity: Insufficiently Active (05/01/2022)  Social Connections: Socially Isolated (04/02/2023)  Stress: No Stress Concern Present (05/01/2022)  Tobacco Use: Low Risk  (02/03/2023)   SDOH Interventions:     Readmission Risk Interventions     No data to display

## 2023-04-04 NOTE — Progress Notes (Signed)
Physical Therapy Evaluation Patient Details Name: Katherine Olson MRN: [id number] DOB: Jul 22, 1926 Today's Date: 04/04/2023  History of Present Illness  Pt is a 89 y/o female admitted secondary to hematochezia and found to have a GIB likely diverticular per GI. PMH including but not limited to HTN and Alzhemier's Dementia.  Clinical Impression  Pt was working on bed mobility, attempting to sit upright at EOB with assistance from Mobility Specialist upon PT arrival. No family/caregivers present throughout session and pt was very confused throughout (only oriented to self). Pt asking the same questions multiple times throughout the session with PT and Mobility Specialist re-orienting her each time. Per chart review, pt lives with her son and per MD report, her son would like pt to return home with him upon d/c. At the time of evaluation, pt very limited with functional mobility and required total assistance of two people for bed mobility and transfers. Per medical staff, pt has been ambulating to the bathroom in her room and back to bed with assistance from RN staff. However, based on the drastic difference between PLOF reports and her performance during evaluation, PT recommending further intensive therapy (<3hrs/day) for short-term rehab to maximize her safety and independence with functional mobility prior to returning home with family support. Pt would continue to benefit from skilled physical therapy services at this time while admitted and after d/c to address the below listed limitations in order to improve overall safety and independence with functional mobility.       If plan is discharge home, recommend the following: A lot of help with walking and/or transfers;A lot of help with bathing/dressing/bathroom;Assistance with feeding;Assistance with cooking/housework;Direct supervision/assist for medications management;Direct supervision/assist for financial management;Assist for transportation;Help with  stairs or ramp for entrance;Supervision due to cognitive status   Can travel by private vehicle   No    Equipment Recommendations Other (comment) (defer to next venue of care)  Recommendations for Other Services       Functional Status Assessment Patient has had a recent decline in their functional status and demonstrates the ability to make significant improvements in function in a reasonable and predictable amount of time.     Precautions / Restrictions Precautions Precautions: Fall Restrictions Weight Bearing Restrictions Per Provider Order: No      Mobility  Bed Mobility Overal bed mobility: Needs Assistance Bed Mobility: Supine to Sit, Sit to Supine     Supine to sit: Total assist, +2 for physical assistance, HOB elevated, Used rails Sit to supine: Total assist, +2 for physical assistance   General bed mobility comments: increased time and effort, multimodal cueing utilized, pt required total A x2    Transfers Overall transfer level: Needs assistance Equipment used: Rolling walker (2 wheels), 2 person hand held assist Transfers: Sit to/from Stand Sit to Stand: Total assist, +2 physical assistance           General transfer comment: pt initially attempting to stand from EOB with use of RW and two person assist on each side; however, this was unsuccessful. Pt was able to stand from EOB upon second attempt with [facility], use of gait belt and bed pads to achieve an upright standing position. Total A x2 also needed to maintain standing    Ambulation/Gait               General Gait Details: pt unable at the time of evaluation despite multimodal cueing and two person assistance  Stairs  Wheelchair Mobility     Tilt Bed    Modified Rankin (Stroke Patients Only)       Balance Overall balance assessment: Needs assistance Sitting-balance support: Feet supported, Single extremity supported, Bilateral upper extremity supported Sitting  balance-Leahy Scale: Poor     Standing balance support: Bilateral upper extremity supported Standing balance-Leahy Scale: Zero Standing balance comment: total A x2                             Pertinent Vitals/Pain Pain Assessment Pain Assessment: No/denies pain    Home Living Family/patient expects to be discharged to:: Unsure Living Arrangements: Children                 Additional Comments: pt with significant cognitive deficits at baseline (Alzheimer's dementia) with no family/caregivers present at evaluation; per chart review, pt lives with her son    Prior Function Prior Level of Function : Patient poor historian/Family not available             Mobility Comments: pt with significant cognitive deficits at baseline (Alzheimer's dementia) with no family/caregivers present at evaluation; per chart review, pt lives with her son       Extremity/Trunk Assessment   Upper Extremity Assessment Upper Extremity Assessment: Difficult to assess due to impaired cognition;Defer to OT evaluation    Lower Extremity Assessment Lower Extremity Assessment: Difficult to assess due to impaired cognition       Communication   Communication Communication: Difficulty following commands/understanding Following commands: Follows one step commands inconsistently Cueing Techniques: Verbal cues;Gestural cues;Tactile cues;Visual cues  Cognition Arousal: Alert Behavior During Therapy: Restless, Anxious Overall Cognitive Status: No family/caregiver present to determine baseline cognitive functioning Area of Impairment: Orientation, Memory, Attention, Following commands, Safety/judgement, Awareness, Problem solving                 Orientation Level: Disoriented to, Place, Time, Situation Current Attention Level: Focused Memory: Decreased short-term memory Following Commands: Follows one step commands inconsistently Safety/Judgement: Decreased awareness of deficits,  Decreased awareness of safety Awareness: Intellectual Problem Solving: Slow processing, Decreased initiation, Difficulty sequencing, Requires verbal cues, Requires tactile cues          General Comments      Exercises     Assessment/Plan    PT Assessment Patient needs continued PT services  PT Problem List Decreased strength;Decreased range of motion;Decreased activity tolerance;Decreased balance;Decreased mobility;Decreased coordination;Decreased cognition;Decreased knowledge of use of DME;Decreased safety awareness;Decreased knowledge of precautions       PT Treatment Interventions DME instruction;Gait training;Functional mobility training;Stair training;Therapeutic activities;Therapeutic exercise;Balance training;Neuromuscular re-education;Cognitive remediation;Patient/family education    PT Goals (Current goals can be found in the Care Plan section)  Acute Rehab PT Goals Patient Stated Goal: unable to state PT Goal Formulation: Patient unable to participate in goal setting Time For Goal Achievement: 04/18/23 Potential to Achieve Goals: Fair    Frequency Min 1X/week     Co-evaluation               AM-PAC PT "6 Clicks" Mobility  Outcome Measure Help needed turning from your back to your side while in a flat bed without using bedrails?: A Lot Help needed moving from lying on your back to sitting on the side of a flat bed without using bedrails?: Total Help needed moving to and from a bed to a chair (including a wheelchair)?: Total Help needed standing up from a chair using your arms (e.g., wheelchair or  bedside chair)?: Total Help needed to walk in hospital room?: Total Help needed climbing 3-5 steps with a railing? : Total 6 Click Score: 7    End of Session Equipment Utilized During Treatment: Gait belt Activity Tolerance: Patient limited by fatigue Patient left: in bed;with call bell/phone within reach;with bed alarm set Nurse Communication: Mobility  status PT Visit Diagnosis: Other abnormalities of gait and mobility (R26.89)    Time: 7846-9629 PT Time Calculation (min) (ACUTE ONLY): 15 min   Charges:   PT Evaluation $PT Eval Moderate Complexity: 1 Mod   PT General Charges $$ ACUTE PT VISIT: 1 Visit         Arletta Bale, DPT  Acute Rehabilitation Services Office [phone]   Alessandra Bevels Zigmund Linse 04/04/2023, 11:18 AM

## 2023-04-05 ENCOUNTER — Telehealth: Payer: Self-pay

## 2023-04-05 NOTE — Transitions of Care (Post Inpatient/ED Visit) (Signed)
   04/05/2023  Name: Katherine Olson MRN: [id number] DOB: 18-Jan-1927  Today's TOC FU Call Status:    Attempted to reach the patient regarding the most recent Inpatient/ED visit. Patient was called in an Outreach attempt to offer VBCI  30-day TOC program.Pt is eligible for program due to potential risk for readmission and/or high utilization. Unfortunately, I was not able to speak with the patient's son in regards to recent hospital discharge   Follow Up Plan: Additional outreach attempts will be made to reach the patient to complete the Transitions of Care (Post Inpatient/ED visit) call.   Jodelle Gross RN, BSN, CCM [place]  [facility] Manager Population Health Direct Dial: [phone]  Fax: [phone]

## 2023-04-06 ENCOUNTER — Telehealth: Payer: Self-pay

## 2023-04-06 NOTE — Transitions of Care (Post Inpatient/ED Visit) (Signed)
   04/06/2023  Name: Katherine Olson MRN: [id number] DOB: 13-May-1926  Today's TOC FU Call Status: Today's TOC FU Call Status:: Unsuccessful Call (2nd Attempt)  Attempted to reach the patient regarding the most recent Inpatient/ED visit. Patient was called in an Outreach attempt to offer VBCI  30-day TOC program.Pt is eligible for program due to potential risk for readmission and/or high utilization. Unfortunately, I was not able to speak with the patient in regards to recent hospital discharge   Follow Up Plan: Additional outreach attempts will be made to reach the patient to complete the Transitions of Care (Post Inpatient/ED visit) call.   Barnie Gowda RN, BSN, CCM [place]  [facility] Manager Population Health Direct Dial: [phone]  Fax: [phone]

## 2023-04-07 ENCOUNTER — Telehealth: Payer: Self-pay

## 2023-04-07 NOTE — Transitions of Care (Post Inpatient/ED Visit) (Signed)
   04/07/2023  Name: MAZAL EBEY MRN: [id number] DOB: 08-01-1926  Today's TOC FU Call Status: Today's TOC FU Call Status:: Successful TOC FU Call Completed TOC FU Call Complete Date: 04/07/23  Attempted to reach the patient regarding the most recent Inpatient/ED visit.  Follow Up Plan: No further outreach attempts will be made at this time. We have been unable to contact the patient.  The patient's son states he can no longer get her out of the house to the doctor's office and now has a PCP that makes Housecalls.   Medford Balboa, BSN, RN [place]  VBCI - [employer] [facility] RN Care Manager [phone]

## 2023-04-18 ENCOUNTER — Other Ambulatory Visit: Payer: Self-pay | Admitting: Family

## 2023-04-18 DIAGNOSIS — I10 Essential (primary) hypertension: Secondary | ICD-10-CM

## 2023-04-22 ENCOUNTER — Telehealth: Payer: Self-pay

## 2023-04-22 NOTE — Telephone Encounter (Signed)
The patient son [name]) called in wanting to speak to a nurse. I inform him that we will not be able to give any information because she is not a patient of our. The son said that he has a general question.

## 2023-04-23 ENCOUNTER — Telehealth: Payer: Self-pay

## 2023-04-23 NOTE — Telephone Encounter (Signed)
Copied from CRM [id number]. Topic: General - Call Back - No Documentation >> Apr 23, 2023  1:27 PM Isabelle Course C wrote: Reason for CRM: Patient missed a call. Son of patient want you to give them a call back

## 2023-04-23 NOTE — Telephone Encounter (Signed)
Spoke to Katherine Olson from [facility] [phone] and explained to her that we are no longer the pt provider and they need to contact her new provider for more information and  to place orders. [name] stated that she would make sure that they sent to  pt new PCP. Son has been notified as well

## 2023-04-23 NOTE — Telephone Encounter (Signed)
Spoke to pt son and he stated that he had told them @ [facility] that they needed to send info to pt new Provider not to our office !

## 2023-04-23 NOTE — Telephone Encounter (Signed)
Copied from CRM [id number]. Topic: Clinical - Medical Advice >> Apr 23, 2023 12:59 PM Chantha C wrote: Reason for CRM:  Mae from [facility] [phone] to verify home health care, the verification code #[id number] was denied by [facility] and can apply for an appeal. [employer] needs documentation on why patient needs home health care. Please advise.

## 2023-04-26 NOTE — Telephone Encounter (Signed)
 noted

## 2023-05-20 ENCOUNTER — Other Ambulatory Visit: Payer: Self-pay | Admitting: Family

## 2023-05-20 DIAGNOSIS — E785 Hyperlipidemia, unspecified: Secondary | ICD-10-CM

## 2023-05-26 ENCOUNTER — Other Ambulatory Visit: Payer: Self-pay | Admitting: Family

## 2023-05-26 DIAGNOSIS — F419 Anxiety disorder, unspecified: Secondary | ICD-10-CM

## 2023-06-02 ENCOUNTER — Other Ambulatory Visit: Payer: Self-pay | Admitting: Family

## 2023-06-02 DIAGNOSIS — F419 Anxiety disorder, unspecified: Secondary | ICD-10-CM

## 2023-08-09 ENCOUNTER — Other Ambulatory Visit: Payer: Self-pay | Admitting: Family

## 2023-08-09 DIAGNOSIS — E785 Hyperlipidemia, unspecified: Secondary | ICD-10-CM

## 2023-09-21 ENCOUNTER — Other Ambulatory Visit: Payer: Self-pay | Admitting: Family

## 2023-09-21 DIAGNOSIS — I10 Essential (primary) hypertension: Secondary | ICD-10-CM

## 2023-10-02 IMAGING — CT CT HEAD W/O CM
4 of 5 series · 16 of 47 positions shown, 18 images · non-contrast
Comparison: February 08, 2013

CLINICAL DATA: Altered mental status and weakness.



[Series 2: head wo · axial · 0.44mm/px · z∈[+1382,+1482]mm · 5 of 32 slices shown, 7 images]
[im 6/32  brain]
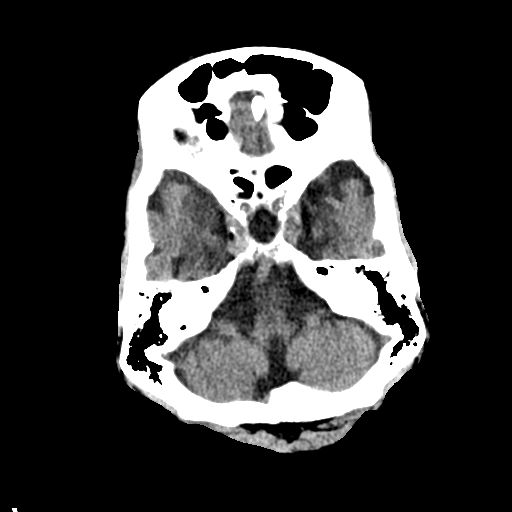
[im 6/32  bone]
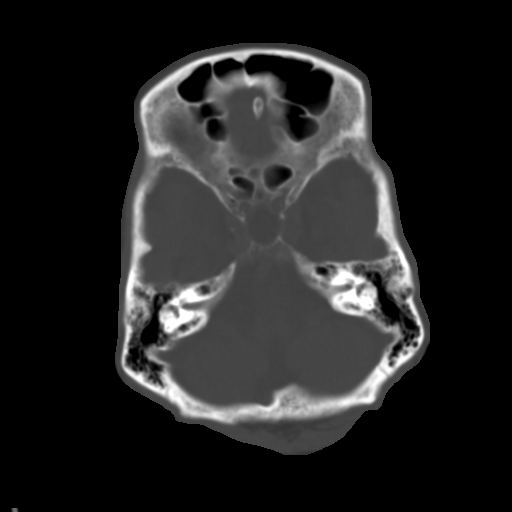
[im 11/32  brain]
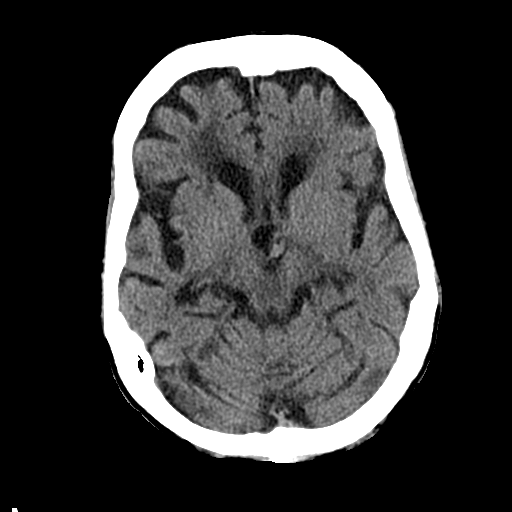
[im 16/32  brain]
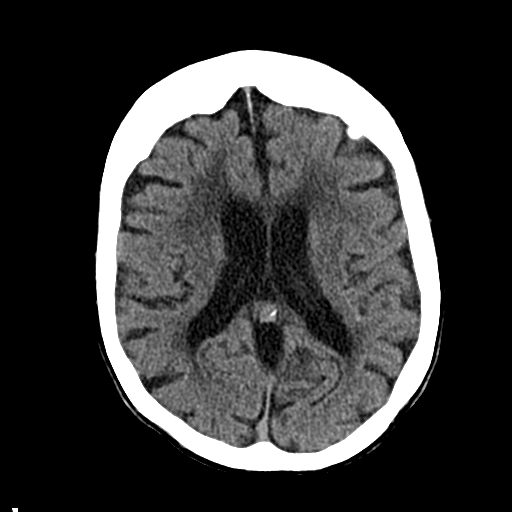
[im 21/32  brain]
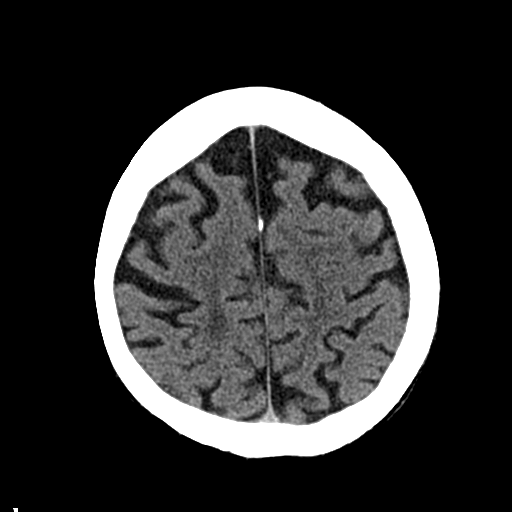
[im 26/32  brain]
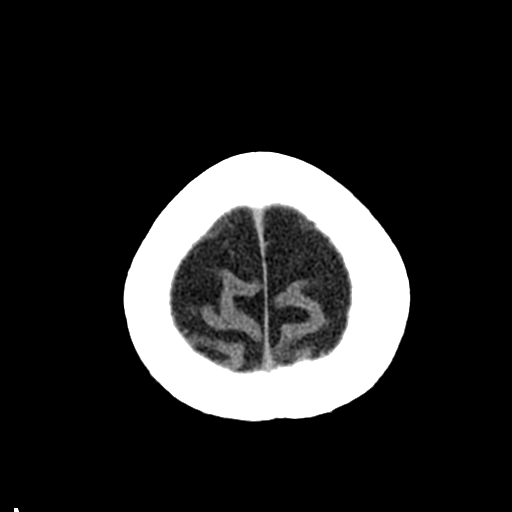
[im 26/32  bone]
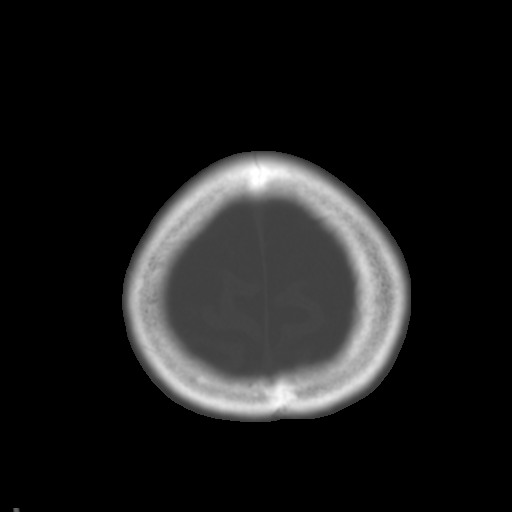

[Series 4: ax head wo · axial · 0.36mm/px · z∈[+1329,+1437]mm · 5 of 34 slices shown]
[im 6/34  brain]
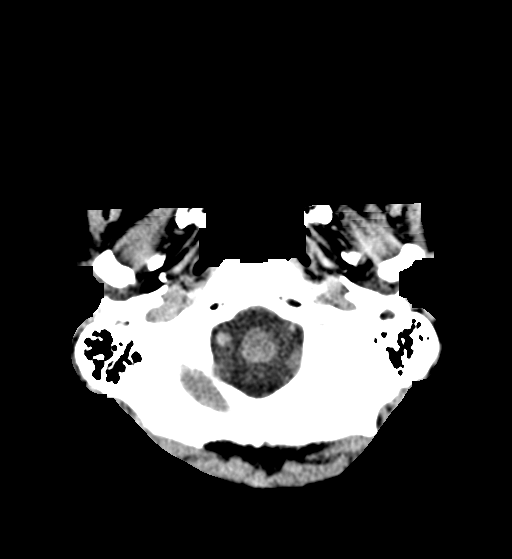
[im 12/34  brain]
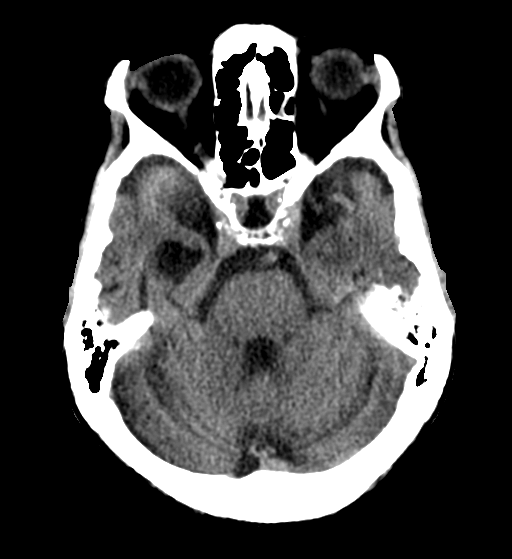
[im 17/34  brain]
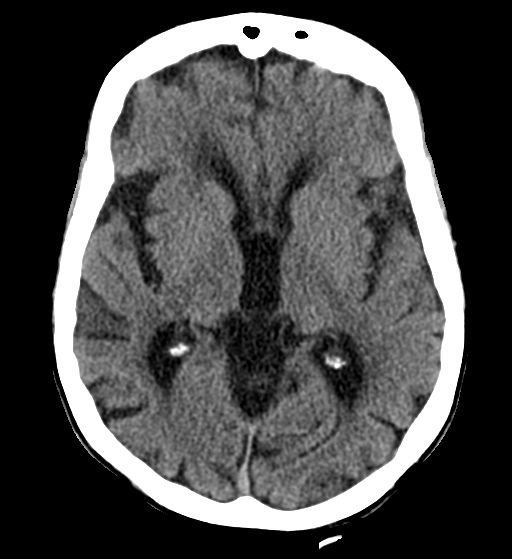
[im 23/34  brain]
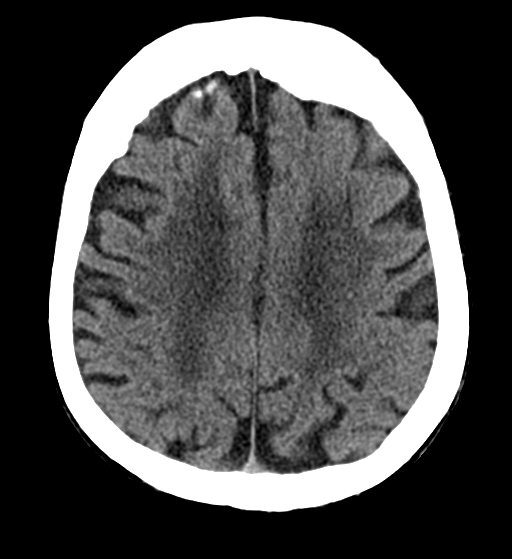
[im 28/34  brain]
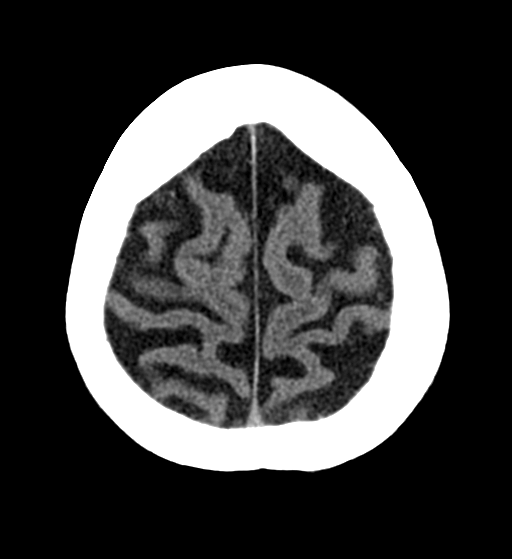

[Series 5: coronal soft tissue · coronal · 0.33mm/px · 3 of 66 slices shown]
[im 22/66  brain]
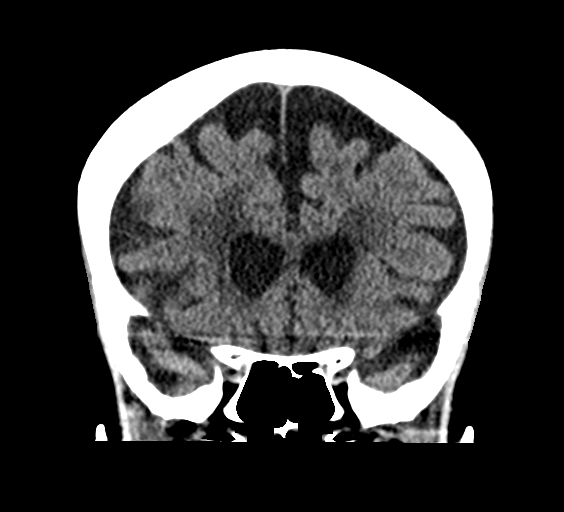
[im 29/66  brain]
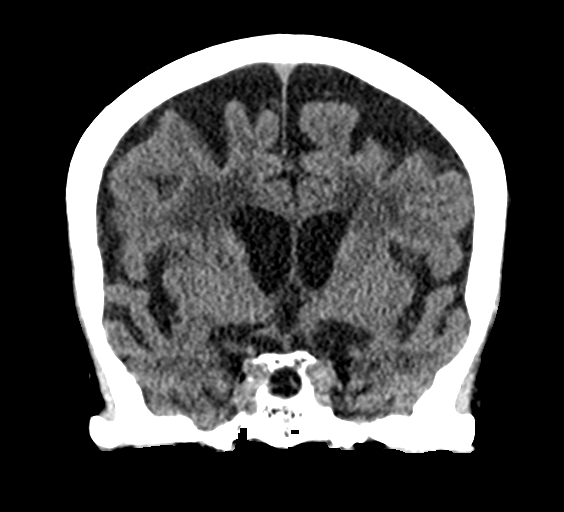
[im 37/66  brain]
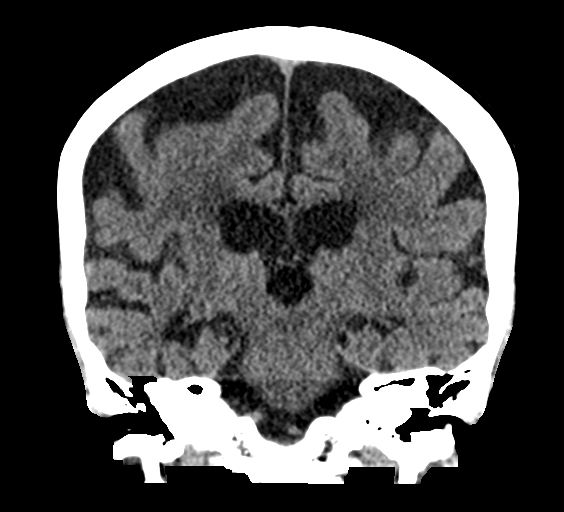

[Series 6: sagittal soft tissue · sagittal · 0.34mm/px · 3 of 53 slices shown]
[im 18/53  brain]
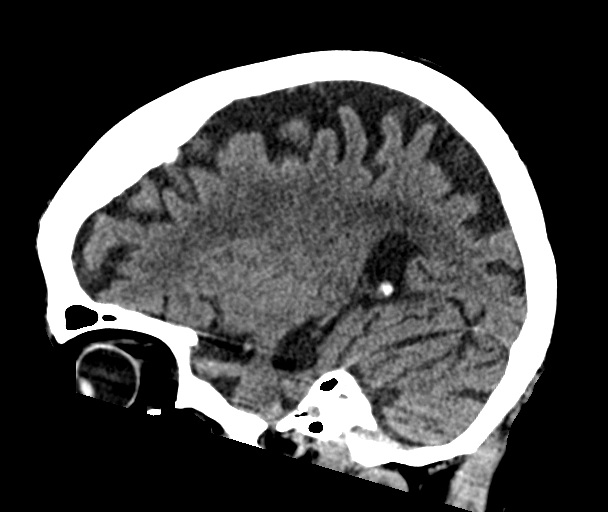
[im 27/53  brain]
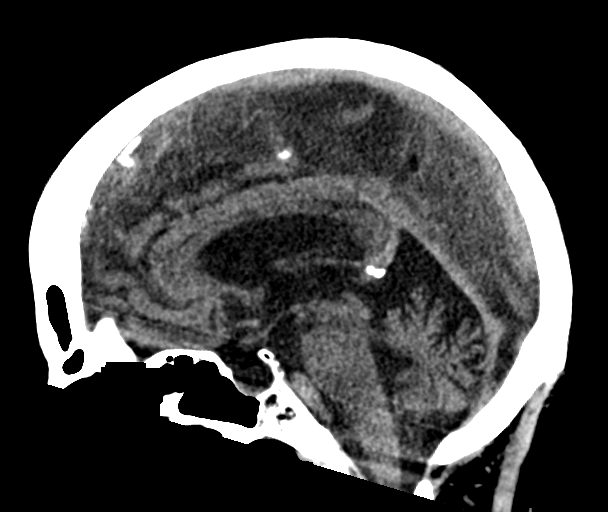
[im 35/53  brain]
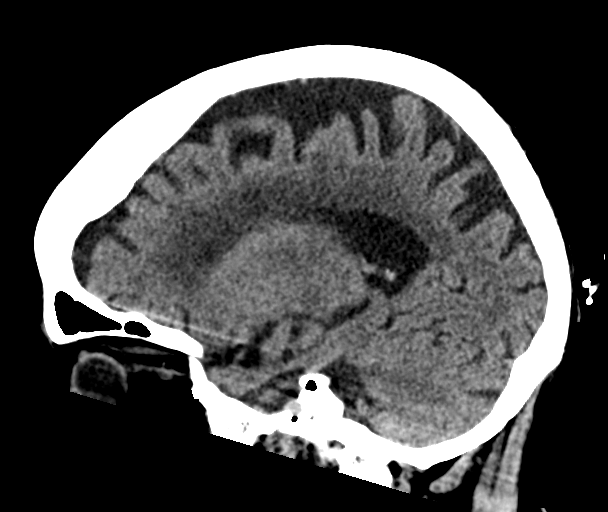

[16 of 47 positions shown; findings below may reference images not displayed]

FINDINGS: Brain: There is moderate severity cerebral atrophy with widening of
the extra-axial spaces and ventricular dilatation.
There are areas of decreased attenuation within the white matter
tracts of the supratentorial brain, consistent with microvascular
disease changes.

Vascular: No hyperdense vessel or unexpected calcification.

Skull: Normal. Negative for fracture or focal lesion.

Sinuses/Orbits: No acute finding.

Other: None.
IMPRESSION: 1. No acute intracranial abnormality.
2. Moderate severity cerebral atrophy and microvascular disease
changes of the supratentorial brain.

## 2023-11-30 ENCOUNTER — Ambulatory Visit

## 2023-11-30 DIAGNOSIS — Z23 Encounter for immunization: Secondary | ICD-10-CM

## 2023-11-30 DIAGNOSIS — Z719 Counseling, unspecified: Secondary | ICD-10-CM

## 2023-11-30 NOTE — Progress Notes (Addendum)
 Homevisit to homebound pt to administer flu vaccine per son's request.  Vaccine screening and administration record completed and signed (copy sent for scanning).  VIS given and post vaccination care reviewed.    Fluzone high dose administered IM right deltoid by Devere Pizza RN; tolerated well by pt.  [facility] updated and copy mailed to pt.  Son requested covid vaccine for pt at a later date.  Shona KATHEE Diesel, RN

## 2024-01-03 ENCOUNTER — Ambulatory Visit (LOCAL_COMMUNITY_HEALTH_CENTER)

## 2024-01-03 DIAGNOSIS — Z719 Counseling, unspecified: Secondary | ICD-10-CM

## 2024-01-03 DIAGNOSIS — Z23 Encounter for immunization: Secondary | ICD-10-CM

## 2024-01-03 NOTE — Progress Notes (Addendum)
 Homevisit to homebound pt to administer Comirnaty  covid 19 vaccine as requested by pt's son.  Vaccine screening form completed and signed by pt's son (sent for scanning).  VIS provided.  Comirnaty  12+ 2025-26 formula administered by Devere Pizza RN and tolerated well by pt.  Reviewed post vaccination care and questions answered.  [facility] updated and copy mailed.  Shona KATHEE Diesel, RN

## 2024-03-11 ENCOUNTER — Other Ambulatory Visit: Payer: Self-pay | Admitting: Family

## 2024-03-11 DIAGNOSIS — I10 Essential (primary) hypertension: Secondary | ICD-10-CM
